# Patient Record
Sex: Male | Born: 1940 | Race: White | Hispanic: No | Smoking: Former smoker
Health system: Southern US, Community
[De-identification: ages and names within clinical notes are randomized; demographics above are authoritative.]

## PROBLEM LIST (undated history)

## (undated) DIAGNOSIS — I480 Paroxysmal atrial fibrillation: Secondary | ICD-10-CM

## (undated) DIAGNOSIS — I5189 Other ill-defined heart diseases: Secondary | ICD-10-CM

## (undated) DIAGNOSIS — I4891 Unspecified atrial fibrillation: Secondary | ICD-10-CM

## (undated) DIAGNOSIS — C801 Malignant (primary) neoplasm, unspecified: Secondary | ICD-10-CM

## (undated) DIAGNOSIS — I2510 Atherosclerotic heart disease of native coronary artery without angina pectoris: Secondary | ICD-10-CM

## (undated) DIAGNOSIS — N183 Chronic kidney disease, stage 3 unspecified: Secondary | ICD-10-CM

## (undated) DIAGNOSIS — K219 Gastro-esophageal reflux disease without esophagitis: Secondary | ICD-10-CM

## (undated) DIAGNOSIS — R911 Solitary pulmonary nodule: Secondary | ICD-10-CM

## (undated) DIAGNOSIS — I471 Supraventricular tachycardia, unspecified: Secondary | ICD-10-CM

## (undated) DIAGNOSIS — G4733 Obstructive sleep apnea (adult) (pediatric): Secondary | ICD-10-CM

## (undated) DIAGNOSIS — I10 Essential (primary) hypertension: Secondary | ICD-10-CM

## (undated) DIAGNOSIS — E785 Hyperlipidemia, unspecified: Secondary | ICD-10-CM

## (undated) DIAGNOSIS — N189 Chronic kidney disease, unspecified: Secondary | ICD-10-CM

## (undated) DIAGNOSIS — I639 Cerebral infarction, unspecified: Secondary | ICD-10-CM

## (undated) DIAGNOSIS — C679 Malignant neoplasm of bladder, unspecified: Secondary | ICD-10-CM

## (undated) HISTORY — PX: CHOLECYSTECTOMY: SHX55

## (undated) HISTORY — DX: Unspecified atrial fibrillation: I48.91

## (undated) HISTORY — PX: CORONARY ANGIOPLASTY: SHX604

## (undated) HISTORY — DX: Chronic kidney disease, unspecified: N18.9

## (undated) HISTORY — PX: CARDIAC SURGERY: SHX584

## (undated) HISTORY — PX: NASAL SEPTUM SURGERY: SHX37

## (undated) HISTORY — DX: Malignant neoplasm of bladder, unspecified: C67.9

## (undated) HISTORY — PX: EYE SURGERY: SHX253

---

## 2015-07-18 ENCOUNTER — Encounter (HOSPITAL_COMMUNITY): Payer: Self-pay | Admitting: Emergency Medicine

## 2015-07-18 ENCOUNTER — Emergency Department (HOSPITAL_COMMUNITY): Payer: Medicare Other

## 2015-07-18 ENCOUNTER — Emergency Department (HOSPITAL_COMMUNITY)
Admission: EM | Admit: 2015-07-18 | Discharge: 2015-07-18 | Disposition: A | Discharge status: Home / Self Care | Payer: Medicare Other | Attending: Emergency Medicine | Admitting: Emergency Medicine

## 2015-07-18 DIAGNOSIS — Z7982 Long term (current) use of aspirin: Secondary | ICD-10-CM | POA: Diagnosis not present

## 2015-07-18 DIAGNOSIS — J189 Pneumonia, unspecified organism: Secondary | ICD-10-CM | POA: Diagnosis not present

## 2015-07-18 DIAGNOSIS — I2510 Atherosclerotic heart disease of native coronary artery without angina pectoris: Secondary | ICD-10-CM | POA: Diagnosis not present

## 2015-07-18 DIAGNOSIS — Z79899 Other long term (current) drug therapy: Secondary | ICD-10-CM | POA: Insufficient documentation

## 2015-07-18 DIAGNOSIS — R079 Chest pain, unspecified: Secondary | ICD-10-CM | POA: Diagnosis present

## 2015-07-18 HISTORY — DX: Malignant (primary) neoplasm, unspecified: C80.1

## 2015-07-18 HISTORY — DX: Atherosclerotic heart disease of native coronary artery without angina pectoris: I25.10

## 2015-07-18 LAB — CBC WITH DIFFERENTIAL/PLATELET
BASOS ABS: 0 10*3/uL (ref 0.0–0.1)
Basophils Relative: 0 %
EOS ABS: 0 10*3/uL (ref 0.0–0.7)
EOS PCT: 0 %
HCT: 44.7 % (ref 39.0–52.0)
Hemoglobin: 14.7 g/dL (ref 13.0–17.0)
Lymphocytes Relative: 4 %
Lymphs Abs: 0.9 10*3/uL (ref 0.7–4.0)
MCH: 33.6 pg (ref 26.0–34.0)
MCHC: 32.9 g/dL (ref 30.0–36.0)
MCV: 102.1 fL — AB (ref 78.0–100.0)
MONO ABS: 1 10*3/uL (ref 0.1–1.0)
Monocytes Relative: 5 %
Neutro Abs: 18 10*3/uL — ABNORMAL HIGH (ref 1.7–7.7)
Neutrophils Relative %: 91 %
PLATELETS: 195 10*3/uL (ref 150–400)
RBC: 4.38 MIL/uL (ref 4.22–5.81)
RDW: 14.1 % (ref 11.5–15.5)
WBC: 19.9 10*3/uL — AB (ref 4.0–10.5)

## 2015-07-18 LAB — I-STAT CG4 LACTIC ACID, ED: LACTIC ACID, VENOUS: 1.56 mmol/L (ref 0.5–2.0)

## 2015-07-18 LAB — I-STAT TROPONIN, ED: TROPONIN I, POC: 0.02 ng/mL (ref 0.00–0.08)

## 2015-07-18 LAB — BASIC METABOLIC PANEL
ANION GAP: 8 (ref 5–15)
BUN: 25 mg/dL — AB (ref 6–20)
CALCIUM: 9.2 mg/dL (ref 8.9–10.3)
CO2: 28 mmol/L (ref 22–32)
Chloride: 104 mmol/L (ref 101–111)
Creatinine, Ser: 1.51 mg/dL — ABNORMAL HIGH (ref 0.61–1.24)
GFR calc Af Amer: 50 mL/min — ABNORMAL LOW (ref >60)
GFR, EST NON AFRICAN AMERICAN: 43 mL/min — AB (ref >60)
GLUCOSE: 145 mg/dL — AB (ref 65–99)
POTASSIUM: 4.8 mmol/L (ref 3.5–5.1)
SODIUM: 140 mmol/L (ref 135–145)

## 2015-07-18 MED ORDER — LEVOFLOXACIN 500 MG PO TABS: 500.0000 mg | ORAL_TABLET | Freq: Every day | ORAL | Status: DC

## 2015-07-18 MED ORDER — LEVOFLOXACIN 750 MG PO TABS
750.0000 mg | ORAL_TABLET | Freq: Once | ORAL | Status: AC
  Administered 2015-07-18: 750 mg via ORAL
  Filled 2015-07-18: qty 1

## 2015-07-18 NOTE — Discharge Instructions (Signed)

## 2015-07-18 NOTE — ED Notes (Signed)
PT woke up this AM at 0230 with a rapid heart rate. PT reports he was very uncomfortable. PT had a fluttering, pounding sensation in chest. PT had discomfort that radiated to neck and back. PT reports EMS came out and did an EKG. PT did not come to hospital at that time. EMS EKG shows HR at 96 bpm. PT arrived via POV this AM. No chest discomfort at triage. PT reports he feels "woozy"

## 2015-07-18 NOTE — ED Notes (Signed)
Pt is in stable condition upon d/c and ambulates from ED. 

## 2015-07-18 NOTE — ED Provider Notes (Signed)
CSN: [id number]     Arrival date & time 07/18/15  [id number] History   First MD Initiated Contact with Patient 07/18/15 [id number]     Chief Complaint  Patient presents with  . Chest Pain      HPI  She presents for evaluation after an episode of tachycardia shakes and chills. He is visiting from [place]. He had a normal day yesterday. 230s morning he awakened "freezing cold shaking. States he was "clammy and felt his heart going fast. He did not have a thermometer. He did take some Tylenol. Feeling the palpitations persisted. Per medics were called. 12-lead EKG was obtained which was available to me here and shows a sinus rhythm. No ectopy. He feels like the symptoms slowly improved. He came" to get checked before I had home". He is visiting from [place].  He had no chest pain. He did not feel short of breath. He has had a frequent cough over the last few weeks. Had pneumonia earlier this year that was treated and resolved. History of coronary artery disease. He had a stent place approximately 8 years ago. He had immediate stent occlusion 48 hours later had a repeat cath and repeat stenting. His exam is including echocardiogram, a Myoview since that time according to the patient's report had been "normal".  No past medical history on file. No past surgical history on file. No family history on file. Social History  Substance Use Topics  . Smoking status: Not on file  . Smokeless tobacco: Not on file  . Alcohol Use: Not on file    Review of Systems  Constitutional: Positive for fever, chills and diaphoresis. Negative for appetite change and fatigue.  HENT: Negative for mouth sores, sore throat and trouble swallowing.   Eyes: Negative for visual disturbance.  Respiratory: Negative for cough, chest tightness, shortness of breath and wheezing.   Cardiovascular: Positive for palpitations. Negative for chest pain.  Gastrointestinal: Negative for nausea, vomiting, abdominal pain, diarrhea and  abdominal distention.  Endocrine: Negative for polydipsia, polyphagia and polyuria.  Genitourinary: Negative for dysuria, frequency and hematuria.  Musculoskeletal: Negative for gait problem.  Skin: Negative for color change, pallor and rash.  Neurological: Negative for dizziness, syncope, light-headedness and headaches.  Hematological: Does not bruise/bleed easily.  Psychiatric/Behavioral: Negative for behavioral problems and confusion.      Allergies  Phosphorus; Sulfur; and Amoxicillin  Home Medications   Prior to Admission medications   Medication Sig Start Date End Date Taking? Authorizing Provider  amLODipine (NORVASC) 5 MG tablet Take 5 mg by mouth daily. 04/26/15  Yes Historical Provider, MD  aspirin 81 MG tablet Take 81 mg by mouth daily.   Yes Historical Provider, MD  carvedilol (COREG) 3.125 MG tablet Take 3.125 mg by mouth 2 (two) times daily with a meal.   Yes Historical Provider, MD  levofloxacin (LEVAQUIN) 500 MG tablet Take 1 tablet (500 mg total) by mouth daily. 07/18/15   Tanna Furry, MD  omega-3 acid ethyl esters (LOVAZA) 1 g capsule Take 1 g by mouth daily. 06/26/15  Yes Historical Provider, MD  prednisoLONE acetate (PRED FORTE) 1 % ophthalmic suspension Place 1 drop into the left eye daily.   Yes Historical Provider, MD  RABEprazole (ACIPHEX) 20 MG tablet Take 20 mg by mouth daily. 04/21/15  Yes Historical Provider, MD  simvastatin (ZOCOR) 20 MG tablet Take 20 mg by mouth daily. 05/29/15  Yes Historical Provider, MD  valACYclovir (VALTREX) 500 MG tablet Take 500 mg by mouth daily. 06/12/15  Yes Historical Provider, MD  valsartan-hydrochlorothiazide (DIOVAN-HCT) 160-12.5 MG tablet Take 1 tablet by mouth daily. 04/26/15  Yes Historical Provider, MD   BP 107/56 mmHg  Pulse 69  Temp(Src) 99.8 F (37.7 C) (Oral)  Resp 14  SpO2 95% Physical Exam  Constitutional: He is oriented to person, place, and time. He appears well-developed and well-nourished. No distress.  HENT:   Head: Normocephalic.  Eyes: Conjunctivae are normal. Pupils are equal, round, and reactive to light. No scleral icterus.  Neck: Normal range of motion. Neck supple. No thyromegaly present.  Cardiovascular: Normal rate and regular rhythm.  Exam reveals no gallop and no friction rub.   No murmur heard. Pulmonary/Chest: Effort normal and breath sounds normal. No respiratory distress. He has no wheezes. He has no rales.  Bibasilar fine crackles. Clear with cough.  Abdominal: Soft. Bowel sounds are normal. He exhibits no distension. There is no tenderness. There is no rebound.  Musculoskeletal: Normal range of motion.  Neurological: He is alert and oriented to person, place, and time.  Skin: Skin is warm and dry. No rash noted.  Psychiatric: He has a normal mood and affect. His behavior is normal.    ED Course  Procedures (including critical care time) Labs Review Labs Reviewed  CBC WITH DIFFERENTIAL/PLATELET - Abnormal; Notable for the following:    WBC 19.9 (*)    MCV 102.1 (*)    Neutro Abs 18.0 (*)    All other components within normal limits  BASIC METABOLIC PANEL - Abnormal; Notable for the following:    Glucose, Bld 145 (*)    BUN 25 (*)    Creatinine, Ser 1.51 (*)    GFR calc non Af Amer 43 (*)    GFR calc Af Amer 50 (*)    All other components within normal limits  I-STAT TROPOININ, ED  I-STAT CG4 LACTIC ACID, ED    Imaging Review Dg Chest 2 View  07/18/2015  CLINICAL DATA:  75 year old male with tachycardia and cough. Initial encounter. EXAM: CHEST  2 VIEW COMPARISON:  None. FINDINGS: Low normal lung volumes. Normal cardiac size and mediastinal contours. Visualized tracheal air column is within normal limits. No pneumothorax or pulmonary edema. Mild streaky/interstitial opacity along both lung bases. No pleural effusion. No other pulmonary opacity. Cholecystectomy clips. No acute osseous abnormality identified. IMPRESSION: Mild streaky bibasilar opacity which most resembles  atelectasis. No other acute cardiopulmonary abnormality identified. Electronically Signed   By: Genevie Ann M.D.   On: 07/18/2015 08:51   I have personally reviewed and evaluated these images and lab results as part of my medical decision-making.   EKG Interpretation None      MDM   Final diagnoses:  CAP (community acquired pneumonia)    Patient remains asymptomatic here. Temperature initially 99.8. Had taken some Tylenol earlier in the day. His EKG obtained by paramedics during the episode while he was still symptomatic appears normal without tachycardia or arrhythmia. His chest x-ray shows some streaky atelectasis at lung bases bilaterally. On exam he has crackles at the left base. A somewhat clear with cough do not resolve. He has a frequent congested cough and produces some yellow sputum. This may be a subtle pneumonia. This may be a simple bronchitis. I think he likely had a febrile episode earlier in the night. It is reassuring to know that his rhythm was normal limits and sinus when paramedics obtained his EKG. He has a leukocytosis but no lactic acidosis. Is not hypoxemic. He is able to  her here without symptoms. Given by mouth Levaquin. Discharged home. Appropriate for discharge. He will contact his primary care for evaluation upon his return home later today to [place].    Tanna Furry, MD 07/18/15 1005

## 2015-07-18 NOTE — ED Notes (Signed)
Pt hooked back up to monitor. 

## 2021-01-31 ENCOUNTER — Ambulatory Visit (INDEPENDENT_AMBULATORY_CARE_PROVIDER_SITE_OTHER): Payer: Medicare Other | Admitting: Internal Medicine

## 2021-01-31 ENCOUNTER — Other Ambulatory Visit: Payer: Self-pay

## 2021-01-31 ENCOUNTER — Encounter: Payer: Self-pay | Admitting: Internal Medicine

## 2021-01-31 VITALS — BP 130/76 | HR 69 | Ht 68.0 in | Wt 230.0 lb

## 2021-01-31 DIAGNOSIS — I4891 Unspecified atrial fibrillation: Secondary | ICD-10-CM | POA: Diagnosis not present

## 2021-01-31 DIAGNOSIS — E785 Hyperlipidemia, unspecified: Secondary | ICD-10-CM | POA: Diagnosis not present

## 2021-01-31 DIAGNOSIS — I2510 Atherosclerotic heart disease of native coronary artery without angina pectoris: Secondary | ICD-10-CM | POA: Diagnosis not present

## 2021-01-31 DIAGNOSIS — I480 Paroxysmal atrial fibrillation: Secondary | ICD-10-CM

## 2021-01-31 DIAGNOSIS — I10 Essential (primary) hypertension: Secondary | ICD-10-CM | POA: Diagnosis not present

## 2021-01-31 DIAGNOSIS — K219 Gastro-esophageal reflux disease without esophagitis: Secondary | ICD-10-CM

## 2021-01-31 MED ORDER — RABEPRAZOLE SODIUM 20 MG PO TBEC: 20.0000 mg | DELAYED_RELEASE_TABLET | Freq: Every day | ORAL | 3 refills | Status: DC

## 2021-01-31 NOTE — Patient Instructions (Signed)
Medication Instructions:   Your physician recommends that you continue on your current medications as directed. Please refer to the Current Medication list given to you today.  *If you need a refill on your cardiac medications before your next appointment, please call your pharmacy*   Lab Work:  Today: CBC, CMET, Lipid panel  If you have labs (blood work) drawn today and your tests are completely normal, you will receive your results only by: [place] (if you have MyChart) OR A paper copy in the mail If you have any lab test that is abnormal or we need to change your treatment, we will call you to review the results.   Testing/Procedures:  None ordered   Follow-Up: At [facility], you and your health needs are our priority.  As part of our continuing mission to provide you with exceptional heart care, we have created designated Provider Care Teams.  These Care Teams include your primary Cardiologist (physician) and Advanced Practice Providers (APPs -  Physician Assistants and Nurse Practitioners) who all work together to provide you with the care you need, when you need it.  We recommend signing up for the patient portal called "MyChart".  Sign up information is provided on this After Visit Summary.  MyChart is used to connect with patients for Virtual Visits (Telemedicine).  Patients are able to view lab/test results, encounter notes, upcoming appointments, etc.  Non-urgent messages can be sent to your provider as well.   To learn more about what you can do with MyChart, go to [email or web address].    Your next appointment:   3 month(s)  The format for your next appointment:   In Person  Provider:   You may see Dr. Harrell Gave End or one of the following Advanced Practice Providers on your designated Care Team:   Murray Hodgkins, NP Christell Faith, PA-C Cadence Kathlen Mody, [place]

## 2021-01-31 NOTE — Progress Notes (Signed)
New Outpatient Visit Date: 01/31/2021  Referring Provider: No referring provider defined for this encounter.  Chief Complaint: Establish cardiology care  HPI:  Jason Gates is a 80 y.o. male who is being seen today as a self-referral to establish cardiology care in the area.  He recently moved from [place], [place], to be closer to his son.  His wife died earlier this year.  Jason Gates has a history of coronary artery disease status post remote PCI's to the LAD and diagonal branch that was complicated by what sounds like subacute stent thrombosis.  He also has recently diagnosed paroxysmal atrial fibrillation for which she was briefly on amiodarone but is now only on apixaban without antiarrhythmic therapy.  Other past medical history includes PSVT, hypertension, hyperlipidemia, chronic kidney disease, obstructive sleep apnea on CPAP, stroke, bladder cancer, and GERD.  Jason Gates reports that he initially sought cardiology evaluation when he turned 48, as his father had a history of for heart attacks.  He reports a calcium score that was "okay though subsequent stress test was abnormal and led to cardiac catheterization and PCI to the LAD and a diagonal branch.  He had recurrent chest pain a few days later and required emergent repeat catheterization due to what sounds like abrupt closure of the diagonal branch.  He recovered well from this with most recent catheterization by his prior cardiologist, Dr. Milus Glazier, documenting 70-80% stenosis in proximal segment of D1 (previously stented and reported to be occluded on catheterization from 07/2009) and otherwise mild disease involving the LAD and LCx.  Jason Gates reports that he was diagnosed with atrial fibrillation just a few months ago after receiving his COVID-19 shots.  He was asymptomatic.  He had always had an irregular heartbeat but was never told before about atrial fibrillation.  He was initially on amiodarone though subsequently stopped this after  consulting with his PCP and former cardiologist.  He remains on apixaban.  Overall, he feels fairly well though he notes having put on quite a bit of weight over the last year due to being sedentary and eating out a lot.  He notes occasional episodes of transient chest pain that shoots across the chest and only last for couple of seconds.  It is not exertional and is notably different than what he experienced at the time of his stent closure.  He has exertional dyspnea that has been present for at least a year.  He attributes this to deconditioning and weight gain.  He has occasional palpitations but no lightheadedness or syncope.  He has some swelling in the legs.  --------------------------------------------------------------------------------------------------  Cardiovascular History & Procedures: Cardiovascular Problems: Coronary artery disease status post remote PCI's to LAD and D1 Paroxysmal atrial fibrillation  Risk Factors: Known coronary artery disease, hypertension, hyperlipidemia, obesity, male gender, prior tobacco use, and age greater than 26  Cath/PCI: LHC (02/04/2013, [place], [place]): LMCA normal.  LAD with 20% proximal disease.  70-80% D1 disease.  D2 normal.  LCx with minimal luminal irregularities.  Normal dominant RCA.  LVEF 60-65%.  LVEDP 12 mmHg.  Patent stents. LHC (08/16/2009, [place], [place]): LMCA normal.  LAD with 30% proximal disease and patent proximal stent.  Distal LAD tapers to a small vessel.  D1 stent is occluded.  D2 normal.  LCx with 30% ostial stenosis with calcification.  Large, dominant RCA with mild luminal irregularities.  LVEF 60-65% with LVEDP 15 mmHg.  CV Surgery: None  EP Procedures and Devices: None  Non-Invasive Evaluation(s): Pharmacologic MPI (02/01/2020,  [place], [place]): Normal study without ischemia or scar.  LVEF 60-70%. TTE (07/15/2017, [place], [place]): Normal LV size with mild LVH.  LVEF 55-60% with grade 1 diastolic dysfunction.  Normal RV size and  function.  Trivial MR, TR, and AI.  Recent CV Pertinent Labs: Lab Results  Component Value Date   K 4.8 07/18/2015   BUN 25 (H) 07/18/2015   CREATININE 1.51 (H) 07/18/2015    --------------------------------------------------------------------------------------------------  Past Medical History:  Diagnosis Date   Atrial fibrillation ([place])    Bladder cancer ([place])    Chronic kidney disease (CKD)    Coronary artery disease     Past Surgical History:  Procedure Laterality Date   CARDIAC SURGERY     CHOLECYSTECTOMY     EYE SURGERY     NASAL SEPTUM SURGERY      Current Meds  Medication Sig   amLODipine (NORVASC) 5 MG tablet Take 5 mg by mouth daily.   apixaban (ELIQUIS) 2.5 MG TABS tablet Take 2.5 mg by mouth 2 (two) times daily.   aspirin 81 MG tablet Take 81 mg by mouth daily.   prednisoLONE acetate (PRED FORTE) 1 % ophthalmic suspension Place 1 drop into the left eye daily.   RABEprazole (ACIPHEX) 20 MG tablet Take 20 mg by mouth daily.   simvastatin (ZOCOR) 20 MG tablet Take 20 mg by mouth daily.   valACYclovir (VALTREX) 500 MG tablet Take 500 mg by mouth daily.   valsartan-hydrochlorothiazide (DIOVAN-HCT) 160-12.5 MG tablet Take 1 tablet by mouth daily.    Allergies: Elemental sulfur, Phosphorus, and Amoxicillin  Social History   Tobacco Use   Smoking status: Former    Packs/day: 1.00    Years: 15.00    Pack years: 15.00    Types: Cigarettes    Quit date: 1976    Years since quitting: 46.9   Tobacco comments:    Quit smoking in 1976  Vaping Use   Vaping Use: Never used  Substance Use Topics   Alcohol use: Not Currently    Comment: quit in 1976   Drug use: Never    Family History  Problem Relation Age of Onset   Obesity Mother    Diabetes Father    Heart attack Father    Heart disease Father     Review of Systems: A 12-system review of systems was performed and was negative except as noted in the  HPI.  --------------------------------------------------------------------------------------------------  Physical Exam: BP 130/76 (BP Location: Right Arm, Patient Position: Sitting, Cuff Size: Large)   Pulse 69   Ht 5\' 8"  (1.727 m)   Wt 230 lb (104.3 kg)   SpO2 96%   BMI 34.97 kg/m   General:  NAD. HEENT: No conjunctival pallor or scleral icterus. Facemask in place. Neck: Supple without lymphadenopathy, thyromegaly, JVD, or HJR. No carotid bruit. Lungs: Normal work of breathing. Clear to auscultation bilaterally without wheezes or crackles. Heart: Regular rate and rhythm with occasional extrasystoles.  No murmurs, rubs, or gallops. Non-displaced PMI. Abd: Bowel sounds present. Soft, NT/ND without hepatosplenomegaly Ext: No lower extremity edema. Radial, PT, and DP pulses are 2+ bilaterally Skin: Warm and dry without rash. Neuro: CNIII-XII intact. Strength and fine-touch sensation intact in upper and lower extremities bilaterally. Psych: Normal mood and affect.  EKG:  Normal sinus rhythm with PAC's.  Lab Results  Component Value Date   WBC 19.9 (H) 07/18/2015   HGB 14.7 07/18/2015   HCT 44.7 07/18/2015   MCV 102.1 (H) 07/18/2015   PLT 195 07/18/2015  Lab Results  Component Value Date   NA 140 07/18/2015   K 4.8 07/18/2015   CL 104 07/18/2015   CO2 28 07/18/2015   BUN 25 (H) 07/18/2015   CREATININE 1.51 (H) 07/18/2015   GLUCOSE 145 (H) 07/18/2015    No results found for: CHOL, HDL, LDLCALC, LDLDIRECT, TRIG, CHOLHDL   --------------------------------------------------------------------------------------------------  ASSESSMENT AND PLAN: Coronary artery disease: Jason Gates reports rare episodes of fleeting, nonexertional chest pain on typical of angina.  Myocardial perfusion stress test a year ago in [place] was normal.  We will continue his current medications for secondary prevention.  We could consider discontinuation of ASA in the setting of long-term apixaban  therapy, though given uncertain stent type in the LAD and D1, I think it would be best to continue aspirin and apixaban as long as bleeding issues do not develop.  Continue aggressive secondary prevention.  Paroxysmal atrial fibrillation: EKG today and at prior visit with his former cardiologist in [place] show NSR with PAC's.  We will continue apixaban 2.5 mg BID based on age and prior kidney function on outside labs from 08/2020 (creatinine ~2).  We will repeat a CBC and CMP today.  Defer rechallanging with antiarrhythmic and beta blocker at this time.  Hyperlipidemia: Check lipid panel and CMP today.  Continue simvastatin 20 mg daily for now; low threshold to escalate to high-intensity statin if LDL > 70.  Hypertension: Blood pressure upper normal.  Continue current regimen of amlodipine and valsartan-HCTZ.  GERD: Aciphex refilled today at the patient's request, as he has not yet established with a PCP in the area.  Follow-up: Return to clinic in 3 months.  Approximately 50 minutes were spent on this encounter, including extensive review of outside records provided by Jason Gates.  Nelva Bush, MD 01/31/2021 2:30 PM

## 2021-02-01 ENCOUNTER — Encounter: Payer: Self-pay | Admitting: Internal Medicine

## 2021-02-01 DIAGNOSIS — E785 Hyperlipidemia, unspecified: Secondary | ICD-10-CM | POA: Insufficient documentation

## 2021-02-01 DIAGNOSIS — I480 Paroxysmal atrial fibrillation: Secondary | ICD-10-CM | POA: Insufficient documentation

## 2021-02-01 DIAGNOSIS — I10 Essential (primary) hypertension: Secondary | ICD-10-CM | POA: Insufficient documentation

## 2021-02-01 DIAGNOSIS — I2510 Atherosclerotic heart disease of native coronary artery without angina pectoris: Secondary | ICD-10-CM | POA: Insufficient documentation

## 2021-02-01 DIAGNOSIS — K219 Gastro-esophageal reflux disease without esophagitis: Secondary | ICD-10-CM | POA: Insufficient documentation

## 2021-02-02 LAB — COMPREHENSIVE METABOLIC PANEL
ALT: 25 IU/L (ref 0–44)
AST: 35 IU/L (ref 0–40)
Albumin/Globulin Ratio: 1.5 (ref 1.2–2.2)
Albumin: 4.1 g/dL (ref 3.6–4.6)
Alkaline Phosphatase: 69 IU/L (ref 44–121)
BUN/Creatinine Ratio: 12 (ref 10–24)
BUN: 23 mg/dL (ref 8–27)
Bilirubin Total: 0.4 mg/dL (ref 0.0–1.2)
CO2: 26 mmol/L (ref 20–29)
Calcium: 9.3 mg/dL (ref 8.6–10.2)
Chloride: 108 mmol/L — ABNORMAL HIGH (ref 96–106)
Creatinine, Ser: 1.93 mg/dL — ABNORMAL HIGH (ref 0.76–1.27)
Globulin, Total: 2.7 g/dL (ref 1.5–4.5)
Glucose: 119 mg/dL — ABNORMAL HIGH (ref 70–99)
Potassium: 4.9 mmol/L (ref 3.5–5.2)
Sodium: 147 mmol/L — ABNORMAL HIGH (ref 134–144)
Total Protein: 6.8 g/dL (ref 6.0–8.5)
eGFR: 34 mL/min/{1.73_m2} — ABNORMAL LOW (ref >59)

## 2021-02-02 LAB — CBC
Hematocrit: 40.1 % (ref 37.5–51.0)
Hemoglobin: 14 g/dL (ref 13.0–17.7)
MCH: 34.8 pg — ABNORMAL HIGH (ref 26.6–33.0)
MCHC: 34.9 g/dL (ref 31.5–35.7)
MCV: 100 fL — ABNORMAL HIGH (ref 79–97)
Platelets: 256 10*3/uL (ref 150–450)
RBC: 4.02 x10E6/uL — ABNORMAL LOW (ref 4.14–5.80)
RDW: 13.5 % (ref 11.6–15.4)
WBC: 8.2 10*3/uL (ref 3.4–10.8)

## 2021-02-02 LAB — LIPID PANEL
Chol/HDL Ratio: 3.9 ratio (ref 0.0–5.0)
Cholesterol, Total: 137 mg/dL (ref 100–199)
HDL: 35 mg/dL — ABNORMAL LOW (ref >39)
LDL Chol Calc (NIH): 68 mg/dL (ref 0–99)
Triglycerides: 204 mg/dL — ABNORMAL HIGH (ref 0–149)
VLDL Cholesterol Cal: 34 mg/dL (ref 5–40)

## 2021-02-08 ENCOUNTER — Telehealth: Payer: Self-pay

## 2021-02-08 NOTE — Telephone Encounter (Signed)
This pt LM on the triage line stating that he has a hx of bladder cancer, is new to the area and would like to know the steps of establishing care with [place], can you please give him a call to assist with getting records and making his appointment.

## 2021-04-02 ENCOUNTER — Other Ambulatory Visit (HOSPITAL_COMMUNITY): Payer: Self-pay | Admitting: Nephrology

## 2021-04-02 DIAGNOSIS — R718 Other abnormality of red blood cells: Secondary | ICD-10-CM

## 2021-04-02 DIAGNOSIS — Z8551 Personal history of malignant neoplasm of bladder: Secondary | ICD-10-CM

## 2021-04-02 DIAGNOSIS — I129 Hypertensive chronic kidney disease with stage 1 through stage 4 chronic kidney disease, or unspecified chronic kidney disease: Secondary | ICD-10-CM

## 2021-04-02 DIAGNOSIS — N1832 Chronic kidney disease, stage 3b: Secondary | ICD-10-CM

## 2021-04-02 DIAGNOSIS — R809 Proteinuria, unspecified: Secondary | ICD-10-CM

## 2021-04-02 NOTE — Progress Notes (Signed)
04/08/21 2:58 PM   Jason Gates. Jan 27, 1941 [id number]  Referring provider:  No referring provider defined for this encounter. Chief Complaint  Patient presents with   Bladder Cancer     HPI: Jason Gates. is a 81 y.o.male who presents today for further evaluation of bladder cancer.   He has a personal history of CKD stage 3b, bladder cancer, CIS, and proteinuria.   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  He underwent a TURBT in 2014 and 2018 it was high- grade he had BCG x3 that he didn't tolerate. He is on annual cystoscopic evaluation.   He is currently followed by nephrology.   He reports that he is due to a cystoscopy in April.   He reports that he has a weak stream which is not particularly bothersome.   He has a history of smoking.   PMH: Past Medical History:  Diagnosis Date   Atrial fibrillation ([place])    Bladder cancer ([place])    Chronic kidney disease (CKD)    Coronary artery disease     Surgical History: Past Surgical History:  Procedure Laterality Date   CARDIAC SURGERY     CHOLECYSTECTOMY     EYE SURGERY     NASAL SEPTUM SURGERY      Home Medications:  Allergies as of 04/03/2021       Reactions   Elemental Sulfur Other (See Comments)   intolerance   Phosphorus Other (See Comments)   intolerance   Amoxicillin Rash        Medication List        Accurate as of April 03, 2021 11:59 PM. If you have any questions, ask your nurse or doctor.          amLODipine 5 MG tablet Commonly known as: NORVASC Take 5 mg by mouth daily.   apixaban 2.5 MG Tabs tablet Commonly known as: ELIQUIS Take 2.5 mg by mouth 2 (two) times daily.   aspirin 81 MG tablet Take 81 mg by mouth daily.   prednisoLONE acetate 1 % ophthalmic suspension Commonly known as: PRED FORTE Place 1 drop into the left eye daily.   RABEprazole 20 MG tablet Commonly known as: ACIPHEX Take 1 tablet (20 mg total) by mouth  daily.   simvastatin 20 MG tablet Commonly known as: ZOCOR Take by mouth. What changed: Another medication with the same name was removed. Continue taking this medication, and follow the directions you see here. Changed by: Jason Espy, MD   valACYclovir 500 MG tablet Commonly known as: VALTREX Take by mouth. What changed: Another medication with the same name was removed. Continue taking this medication, and follow the directions you see here. Changed by: Jason Espy, MD   valsartan-hydrochlorothiazide 160-12.5 MG tablet Commonly known as: DIOVAN-HCT Take 1 tablet by mouth daily.        Allergies:  Allergies  Allergen Reactions   Elemental Sulfur Other (See Comments)    intolerance   Phosphorus Other (See Comments)    intolerance   Amoxicillin Rash    Family History: Family History  Problem Relation Age of Onset   Obesity Mother    Diabetes Father    Heart attack Father    Heart disease Father     Social History:  reports that he quit smoking about 47 years ago. His smoking use included cigarettes. He has a 15.00 pack-year smoking history. He does not have any smokeless tobacco history on file. He reports  that he does not currently use alcohol. He reports that he does not use drugs.   Physical Exam: BP (!) 179/76    Pulse 89    Ht 5\' 8"  (1.727 m)    Wt 232 lb (105.2 kg)    BMI 35.28 kg/m   Constitutional:  Alert and oriented, No acute distress. HEENT: [place] AT, moist mucus membranes.  Trachea midline, no masses. Cardiovascular: No clubbing, cyanosis, or edema. Respiratory: Normal respiratory effort, no increased work of breathing. Skin: No rashes, bruises or suspicious lesions. Neurologic: Grossly intact, no focal deficits, moving all 4 extremities. Psychiatric: Normal mood and affect.  Laboratory Data:  Lab Results  Component Value Date   CREATININE 1.93 (H) 01/31/2021   Assessment & Plan:    History of bladder cancer -Records from [place],  Dr. Donzetta Matters personally reviewed today - S/p TURBT in 2014 and 2018.  -We discussed guidelines for continued surveillance based on AUA recommendations, given his history of recurrent and higher risk disease, would recommend continuation of annual surveillance cystoscopy - Schedule for cystoscopy in April  - Return for cystoscopy   Return for cystoscopy   Jason Gates as a scribe for Jason Espy, MD.,have documented all relevant documentation on the behalf of Jason Espy, MD,as directed by  Jason Espy, MD while in the presence of Jason Espy, MD.  I have reviewed the above documentation for accuracy and completeness, and I agree with the above.   Jason Espy, MD    [facility] [address], [place], [place] [address] [phone]

## 2021-04-03 ENCOUNTER — Other Ambulatory Visit: Payer: Self-pay

## 2021-04-03 ENCOUNTER — Ambulatory Visit (INDEPENDENT_AMBULATORY_CARE_PROVIDER_SITE_OTHER): Payer: Medicare Other | Admitting: Urology

## 2021-04-03 ENCOUNTER — Encounter: Payer: Self-pay | Admitting: Urology

## 2021-04-03 VITALS — BP 179/76 | HR 89 | Ht 68.0 in | Wt 232.0 lb

## 2021-04-03 DIAGNOSIS — Z8551 Personal history of malignant neoplasm of bladder: Secondary | ICD-10-CM | POA: Diagnosis not present

## 2021-04-03 NOTE — Patient Instructions (Signed)

## 2021-04-15 ENCOUNTER — Ambulatory Visit (HOSPITAL_COMMUNITY)
Admission: RE | Admit: 2021-04-15 | Discharge: 2021-04-15 | Disposition: A | Discharge status: Home / Self Care | Payer: Medicare Other | Source: Ambulatory Visit | Attending: Nephrology | Admitting: Nephrology

## 2021-04-15 ENCOUNTER — Other Ambulatory Visit: Payer: Self-pay

## 2021-04-15 DIAGNOSIS — I129 Hypertensive chronic kidney disease with stage 1 through stage 4 chronic kidney disease, or unspecified chronic kidney disease: Secondary | ICD-10-CM | POA: Diagnosis present

## 2021-04-15 DIAGNOSIS — N1832 Chronic kidney disease, stage 3b: Secondary | ICD-10-CM | POA: Diagnosis not present

## 2021-04-15 DIAGNOSIS — Z8551 Personal history of malignant neoplasm of bladder: Secondary | ICD-10-CM | POA: Diagnosis present

## 2021-04-15 DIAGNOSIS — R718 Other abnormality of red blood cells: Secondary | ICD-10-CM | POA: Insufficient documentation

## 2021-04-15 DIAGNOSIS — R809 Proteinuria, unspecified: Secondary | ICD-10-CM | POA: Insufficient documentation

## 2021-05-16 ENCOUNTER — Ambulatory Visit (INDEPENDENT_AMBULATORY_CARE_PROVIDER_SITE_OTHER): Payer: Medicare Other | Admitting: Internal Medicine

## 2021-05-16 ENCOUNTER — Other Ambulatory Visit: Payer: Self-pay

## 2021-05-16 ENCOUNTER — Encounter: Payer: Self-pay | Admitting: Internal Medicine

## 2021-05-16 VITALS — BP 142/80 | HR 64 | Ht 68.0 in | Wt 229.0 lb

## 2021-05-16 DIAGNOSIS — I480 Paroxysmal atrial fibrillation: Secondary | ICD-10-CM | POA: Diagnosis not present

## 2021-05-16 DIAGNOSIS — I10 Essential (primary) hypertension: Secondary | ICD-10-CM | POA: Diagnosis not present

## 2021-05-16 DIAGNOSIS — E785 Hyperlipidemia, unspecified: Secondary | ICD-10-CM

## 2021-05-16 DIAGNOSIS — I471 Supraventricular tachycardia: Secondary | ICD-10-CM | POA: Insufficient documentation

## 2021-05-16 DIAGNOSIS — R600 Localized edema: Secondary | ICD-10-CM

## 2021-05-16 DIAGNOSIS — I2510 Atherosclerotic heart disease of native coronary artery without angina pectoris: Secondary | ICD-10-CM | POA: Diagnosis not present

## 2021-05-16 MED ORDER — VALSARTAN 160 MG PO TABS: 160.0000 mg | ORAL_TABLET | Freq: Every day | ORAL | 1 refills | Status: DC

## 2021-05-16 MED ORDER — FUROSEMIDE 40 MG PO TABS: 40.0000 mg | ORAL_TABLET | Freq: Every day | ORAL | 1 refills | Status: DC

## 2021-05-16 NOTE — Patient Instructions (Signed)
Medication Instructions:  ? ?Your physician has recommended you make the following change in your medication:  ? ?STOP Valsartan-Hydrochlorothiazide  ? ?START Valsartan 160 mg daily  ? ?START Furosemide (Lasix) 40 mg daily  ? ?*If you need a refill on your cardiac medications before your next appointment, please call your pharmacy* ? ? ?Lab Work: ? ?Your physician recommends that you return for lab work in: [place] (BMET) ?  - This lab is not fasting ? ?Testing/Procedures: ? ?None ordered ? ? ?Follow-Up: ?At [facility] you and your health needs are our priority.  As part of our continuing mission to provide you with exceptional heart care, we have created designated Provider Care Teams.  These Care Teams include your primary Cardiologist (physician) and Advanced Practice Providers (APPs -  Physician Assistants and Nurse Practitioners) who all work together to provide you with the care you need, when you need it. ? ?We recommend signing up for the patient portal called "MyChart".  Sign up information is provided on this After Visit Summary.  MyChart is used to connect with patients for Virtual Visits (Telemedicine).  Patients are able to view lab/test results, encounter notes, upcoming appointments, etc.  Non-urgent messages can be sent to your provider as well.   ?To learn more about what you can do with MyChart, go to [email or web address].   ? ?Your next appointment:   ?6 week(s) ? ?The format for your next appointment:   ?In Person ? ?Provider:   ?You may see Dr. Harrell Gave End or one of the following Advanced Practice Providers on your designated Care Team:   ?Murray Hodgkins, NP ?Christell Faith, PA-C ?Cadence Kathlen Mody, PA-C ?

## 2021-05-16 NOTE — Progress Notes (Signed)
? ?Follow-up Outpatient Visit ?Date: 05/16/2021 ? ?Primary Care Provider: ?Pcp, No ?No address on file ? ?Chief Complaint: Follow-up CAD and atrial fibrillation ? ?HPI:  Jason Gates is a 81 y.o. male with history of coronary artery disease status post remote PCI's to the LAD and diagonal branch that were complicated by what sounds like subacute stent thrombosis, paroxysmal atrial fibrillation, PSVT, hypertension, hyperlipidemia, chronic kidney disease, obstructive sleep apnea on CPAP, stroke, bladder cancer, and GERD, who presents for follow-up of coronary artery disease and atrial fibrillation.  I met him in 01/2021 shortly after he moved to [place] to be closer to his son.  He reported occasional transient chest pain and stable exertional dyspnea.  Labs drawn at that visit were similar to prior readings with his former providers in [place].  We did not make any medication changes or pursue further testing. ? ?Today, Jason Gates reports he has been feeling fairly well.  He notes that he sometimes wakes up at night with a feeling of his heart racing.  He thinks it may be related to being startled during a dream.  It happens a couple of times per month and usually resolves within a few minutes with deep breathing.  There are no associated symptoms.  He notes occasional transient sharp pains in the chest that are associated with certain movements, usually lasting only a second or two.  He has not had any shortness of breath.  He notes some mild swelling in his legs, that is dependent.  He is thinking of purchasing compression stockings.  He does not check his blood pressure regularly.  He remains on apixaban without bleeding. ? ?-------------------------------------------------------------------------------------------------- ? ?Cardiovascular History & Procedures: ?Cardiovascular Problems: ?Coronary artery disease status post remote PCI's to LAD and D1 ?Paroxysmal atrial fibrillation ?  ?Risk Factors: ?Known coronary artery disease,  hypertension, hyperlipidemia, obesity, male gender, prior tobacco use, and age greater than 47 ?  ?Cath/PCI: ?LHC (02/04/2013, [place], [place]): LMCA normal.  LAD with 20% proximal disease.  70-80% D1 disease.  D2 normal.  LCx with minimal luminal irregularities.  Normal dominant RCA.  LVEF 60-65%.  LVEDP 12 mmHg.  Patent stents. ?LHC (08/16/2009, [place], [place]): LMCA normal.  LAD with 30% proximal disease and patent proximal stent.  Distal LAD tapers to a small vessel.  D1 stent is occluded.  D2 normal.  LCx with 30% ostial stenosis with calcification.  Large, dominant RCA with mild luminal irregularities.  LVEF 60-65% with LVEDP 15 mmHg. ?  ?CV Surgery: ?None ?  ?EP Procedures and Devices: ?None ?  ?Non-Invasive Evaluation(s): ?Pharmacologic MPI (02/01/2020, [place], [place]): Normal study without ischemia or scar.  LVEF 60-70%. ?TTE (07/15/2017, [place], [place]): Normal LV size with mild LVH.  LVEF 55-60% with grade 1 diastolic dysfunction.  Normal RV size and function.  Trivial MR, TR, and AI. ? ?Recent CV Pertinent Labs: ?Lab Results  ?Component Value Date  ? CHOL 137 01/31/2021  ? HDL 35 (L) 01/31/2021  ? [place] 68 01/31/2021  ? TRIG 204 (H) 01/31/2021  ? CHOLHDL 3.9 01/31/2021  ? K 4.9 01/31/2021  ? BUN 23 01/31/2021  ? CREATININE 1.93 (H) 01/31/2021  ? ? ?Past medical and surgical history were reviewed and updated in EPIC. ? ?Current Meds  ?Medication Sig  ? amLODipine (NORVASC) 5 MG tablet Take 5 mg by mouth daily.  ? apixaban (ELIQUIS) 2.5 MG TABS tablet Take 2.5 mg by mouth 2 (two) times daily.  ? aspirin 81 MG tablet Take 81 mg by mouth daily.  ?  furosemide (LASIX) 40 MG tablet Take 1 tablet (40 mg total) by mouth daily.  ? prednisoLONE acetate (PRED FORTE) 1 % ophthalmic suspension Place 1 drop into the left eye daily.  ? RABEprazole (ACIPHEX) 20 MG tablet Take 1 tablet (20 mg total) by mouth daily.  ? simvastatin (ZOCOR) 20 MG tablet Take 20 mg by mouth daily.  ? valACYclovir (VALTREX) 500 MG tablet Take 500 mg by  mouth.  ? valsartan (DIOVAN) 160 MG tablet Take 1 tablet (160 mg total) by mouth daily.  ? [DISCONTINUED] valsartan-hydrochlorothiazide (DIOVAN-HCT) 160-12.5 MG tablet Take 1 tablet by mouth daily.  ? ? ?Allergies: Elemental sulfur, Phosphorus, and Amoxicillin ? ?Social History  ? ?Tobacco Use  ? Smoking status: Former  ?  Packs/day: 1.00  ?  Years: 15.00  ?  Pack years: 15.00  ?  Types: Cigarettes  ?  Quit date: 51  ?  Years since quitting: 22.2  ? Tobacco comments:  ?  Quit smoking in 1976  ?Vaping Use  ? Vaping Use: Never used  ?Substance Use Topics  ? Alcohol use: Not Currently  ?  Comment: quit in 1976  ? Drug use: Never  ? ? ?Family History  ?Problem Relation Age of Onset  ? Obesity Mother   ? Diabetes Father   ? Heart attack Father   ? Heart disease Father   ? ? ?Review of Systems: ?A 12-system review of systems was performed and was negative except as noted in the HPI. ? ?-------------------------------------------------------------------------------------------------- ? ?Physical Exam: ?BP (!) 142/80 (BP Location: Left Arm, Patient Position: Sitting, Cuff Size: Large)   Pulse 64   Ht 5' 8"  (1.727 m)   Wt 229 lb (103.9 kg)   SpO2 95%   BMI 34.82 kg/m?  ? ?General:  NAD. ?Neck: JVP approximately 6 cm with positive HJR. ?Lungs: Clear to auscultation bilaterally without wheezes or crackles. ?Heart: Regular rate and rhythm with occasional extrasystoles. ?Abdomen: Soft, nontender, nondistended. ?Extremities: 1+ pretibial edema bilaterally. ? ?EKG: Normal sinus rhythm with PACs. ? ?Lab Results  ?Component Value Date  ? WBC 8.2 01/31/2021  ? HGB 14.0 01/31/2021  ? HCT 40.1 01/31/2021  ? MCV 100 (H) 01/31/2021  ? PLT 256 01/31/2021  ? ? ?Lab Results  ?Component Value Date  ? NA 147 (H) 01/31/2021  ? K 4.9 01/31/2021  ? CL 108 (H) 01/31/2021  ? CO2 26 01/31/2021  ? BUN 23 01/31/2021  ? CREATININE 1.93 (H) 01/31/2021  ? GLUCOSE 119 (H) 01/31/2021  ? ALT 25 01/31/2021  ? ? ?Lab Results  ?Component Value Date  ?  CHOL 137 01/31/2021  ? HDL 35 (L) 01/31/2021  ? [place] 68 01/31/2021  ? TRIG 204 (H) 01/31/2021  ? CHOLHDL 3.9 01/31/2021  ? ? ?-------------------------------------------------------------------------------------------------- ? ?ASSESSMENT AND PLAN: ?Coronary artery disease: ?Jason Gates does not report frank angina.  He has infrequent episodes of transient, sharp chest pain that is likely noncardiac in nature.  We will continue current medications for secondary prevention including aspirin and simvastatin.  Though he remains on apixaban, I remain reluctant to discontinue aspirin unless bleeding complications ensue in the setting of unknown stent types and report of prior stent thrombosis. ? ?Paroxysmal atrial fibrillation and paroxysmal supraventricular tachycardia: ?Brief episodes of palpitations reported, particularly at night.  These could be short salvos of PAF or PSVT.  Given that they are self-limited and relatively infrequent without associated symptoms, we will defer medication changes at this time.  Given borderline low resting heart rate today,  I favor avoiding addition of a beta-blocker at this time.  Continue apixaban 2.5 mg twice daily, dosed based on age and creatinine greater than 1.5. ? ?Lower extremity edema: ?1+ pretibial edema noted on exam, which could be related to a number of factors including venous insufficiency, chronic kidney disease, and an element of diastolic heart failure.  Given his moderately impaired renal function, I am not sure that HCTZ is offering much benefit for blood pressure control and diuresis.  We have agreed to transition valsartan-HCTZ to valsartan 160 mg daily without HCTZ and instead add furosemide 40 mg daily.  We will repeat a BMP in 2 weeks. ? ?Hypertension: ?Blood pressure mildly elevated today.  As noted above, we will transition from valsartan-HCTZ to valsartan and furosemide.  Continue current dose of amlodipine. ? ?Hyperlipidemia: ?LDL at goal on last check in  01/2021.  Triglycerides were mildly elevated.  We will continue current dose of simvastatin. ? ?Follow-up: BMP in 2 weeks.  Return to clinic in 6 weeks. ? ?Nelva Bush, MD ?05/16/2021 ?4:23 PM ? ?

## 2021-05-17 ENCOUNTER — Telehealth: Payer: Self-pay | Admitting: Internal Medicine

## 2021-05-17 NOTE — Telephone Encounter (Signed)
Spoke with pt. Pt states "I want to talk about coming off this medication [furosemide] because I'm 81 and I'm not sure that I need this." After asking pt questions re his concern, pt reports he took first dose of furosemide 40 mg last night at bedtime. Pt up "all night going to the restroom." Pt also reports he felt dizzy upon getting up last night.  ? ?Pt did not check BP while feeling dizzy.  ?BP this morning 140/72. Pt denies any symptoms at time of call.  ? ?Pt in office yesterday to see Dr. Saunders Revel. Per note: ?Lower extremity edema: ?1+ pretibial edema noted on exam.. ?We have agreed to transition valsartan-HCTZ to valsartan 160 mg daily without HCTZ and instead add furosemide 40 mg daily.  We will repeat a BMP in 2 weeks ? ?Pt scheduled for repeat BMET 06/03/21 in our office. ? ?Advised pt this med change was d/t LE edema and advised that he only take furosemide in the morning to prevent going to the bathroom all night. Advised pt that the incr urine output was a good sign that the pill is working re the plan to reduce swelling. Advised pt to try taking in morning, monitor symptoms and blood pressure, let us know if have continued dizziness. Advised to check BP if symptomatic as well. If BP low and/or dizziness, pt will stop medication and let us know on Monday to follow up.  ? ?Pt agreeable to try this plan noted above, and has no further questions.  ?

## 2021-05-17 NOTE — Telephone Encounter (Signed)
Pt c/o medication issue: ? ?1. Name of Medication: Lasix  ? ?2. How are you currently taking this medication (dosage and times per day)? 1 tablet new started ;ast night ? ?3. Are you having a reaction (difficulty breathing--STAT)? Dizzy, up all night using restroom ? ?4. What is your medication issue? New medication ?

## 2021-05-17 NOTE — Telephone Encounter (Signed)
Attempted to call pt back. No answer. Lmtcb.  

## 2021-06-03 ENCOUNTER — Telehealth: Payer: Self-pay | Admitting: Internal Medicine

## 2021-06-03 ENCOUNTER — Other Ambulatory Visit: Payer: Medicare Other

## 2021-06-03 NOTE — Progress Notes (Signed)
? ?  06/04/21 ?CC:  ?Chief Complaint  ?Patient presents with  ? Cysto  ? ? ? ?HPI: ?Jason Gates. is a 81 y.o. male with a personal history of CKD stage 3b, bladder cancer, CIS, and proteinuria, who presents today for  ? 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  He underwent a TURBT in 2014 and 2018 it was high- grade he had BCG x3 that he didn't tolerate.  ? ?He is currently followed by nephrology.  ? ?He has a history of smoking.  ? ?Vitals:  ? 06/04/21 1335  ?BP: (!) 148/81  ?Pulse: 98  ? ?NED. A&Ox3.   ?No respiratory distress   ?Abd soft, NT, ND ?Normal phallus with bilateral descended testicles ? ?Cystoscopy Procedure Note ? ?Patient identification was confirmed, informed consent was obtained, and patient was prepped using Betadine solution.  Lidocaine jelly was administered per urethral meatus.   ? ? ?Pre-Procedure: ?- Inspection reveals a normal caliber ureteral meatus. ? ?Procedure: ?The flexible cystoscope was introduced without difficulty ?- No urethral strictures/lesions are present. ?- mildly enlarged prostate  ?- Normal bladder neck ?- Bilateral ureteral orifices identified ?- Bladder mucosa  multiple TUR stellate scars but no tumors  ?- No bladder stones ?- Mild trabeculation ? ?Retroflexion shows NED today  ? ? ?Post-Procedure: ?- Patient tolerated the procedure well ? ? ?Assessment/ Plan: ?History of bladder cancer  ?- -S/p TURBT in 2014 and 2018.  ?- Cystoscopy showed NED  ?- Will monitor with surveillance cystoscopy in 1 year.  ? ?Conley Rolls as a scribe for Hollice Espy, MD.,have documented all relevant documentation on the behalf of Hollice Espy, MD,as directed by  Hollice Espy, MD while in the presence of Hollice Espy, MD. ? ?I have reviewed the above documentation for accuracy and completeness, and I agree with the above.  ? ?Hollice Espy, MD ? ?

## 2021-06-04 ENCOUNTER — Ambulatory Visit (INDEPENDENT_AMBULATORY_CARE_PROVIDER_SITE_OTHER): Payer: Medicare Other | Admitting: Urology

## 2021-06-04 VITALS — BP 148/81 | HR 98 | Ht 68.0 in | Wt 229.0 lb

## 2021-06-04 DIAGNOSIS — Z8551 Personal history of malignant neoplasm of bladder: Secondary | ICD-10-CM | POA: Diagnosis not present

## 2021-06-04 LAB — URINALYSIS, COMPLETE
Bilirubin, UA: NEGATIVE
Glucose, UA: NEGATIVE
Leukocytes,UA: NEGATIVE
Nitrite, UA: NEGATIVE
Protein,UA: NEGATIVE
RBC, UA: NEGATIVE
Specific Gravity, UA: 1.025 (ref 1.005–1.030)
Urobilinogen, Ur: 0.2 mg/dL (ref 0.2–1.0)
pH, UA: 5.5 (ref 5.0–7.5)

## 2021-06-04 LAB — MICROSCOPIC EXAMINATION
Bacteria, UA: NONE SEEN
RBC: NONE SEEN /hpf (ref 0–2)

## 2021-06-10 ENCOUNTER — Other Ambulatory Visit (INDEPENDENT_AMBULATORY_CARE_PROVIDER_SITE_OTHER): Payer: Medicare Other

## 2021-06-10 DIAGNOSIS — I480 Paroxysmal atrial fibrillation: Secondary | ICD-10-CM

## 2021-06-10 DIAGNOSIS — I2510 Atherosclerotic heart disease of native coronary artery without angina pectoris: Secondary | ICD-10-CM

## 2021-06-10 DIAGNOSIS — I10 Essential (primary) hypertension: Secondary | ICD-10-CM

## 2021-06-11 LAB — BASIC METABOLIC PANEL
BUN/Creatinine Ratio: 11 (ref 10–24)
BUN: 19 mg/dL (ref 8–27)
CO2: 26 mmol/L (ref 20–29)
Calcium: 9.4 mg/dL (ref 8.6–10.2)
Chloride: 104 mmol/L (ref 96–106)
Creatinine, Ser: 1.67 mg/dL — ABNORMAL HIGH (ref 0.76–1.27)
Glucose: 121 mg/dL — ABNORMAL HIGH (ref 70–99)
Potassium: 4.2 mmol/L (ref 3.5–5.2)
Sodium: 145 mmol/L — ABNORMAL HIGH (ref 134–144)
eGFR: 41 mL/min/{1.73_m2} — ABNORMAL LOW (ref >59)

## 2021-06-12 ENCOUNTER — Other Ambulatory Visit: Payer: Self-pay | Admitting: Internal Medicine

## 2021-06-24 ENCOUNTER — Telehealth: Payer: Self-pay | Admitting: Internal Medicine

## 2021-06-24 NOTE — Telephone Encounter (Signed)
Pt c/o medication issue: ? ?1. Name of Medication:  ? ?2. How are you currently taking this medication (dosage and times per day)?  ? ?3. Are you having a reaction (difficulty breathing--STAT)?  ? ?4. What is your medication issue?  ? ?Patient states he purchased a new supplement and instructions advise to consult with his doctor. He was unable to provide the name of the supplement, but would like a call back from Dr. Darnelle Bos nurse to discuss further.  ? ?

## 2021-06-24 NOTE — Telephone Encounter (Signed)
Spoke with patient and he didn't realize until now that he has appointment this week with provider. He will just bring in the supplement with him to that appointment to review with him at that time. He was appreciative for the call back with no further questions at this time.  ?

## 2021-06-27 ENCOUNTER — Encounter: Payer: Self-pay | Admitting: Internal Medicine

## 2021-06-27 ENCOUNTER — Ambulatory Visit (INDEPENDENT_AMBULATORY_CARE_PROVIDER_SITE_OTHER): Payer: Medicare Other | Admitting: Internal Medicine

## 2021-06-27 VITALS — BP 160/70 | HR 73 | Ht 68.0 in | Wt 232.0 lb

## 2021-06-27 DIAGNOSIS — E785 Hyperlipidemia, unspecified: Secondary | ICD-10-CM

## 2021-06-27 DIAGNOSIS — I2510 Atherosclerotic heart disease of native coronary artery without angina pectoris: Secondary | ICD-10-CM

## 2021-06-27 DIAGNOSIS — I480 Paroxysmal atrial fibrillation: Secondary | ICD-10-CM | POA: Diagnosis not present

## 2021-06-27 DIAGNOSIS — I10 Essential (primary) hypertension: Secondary | ICD-10-CM | POA: Diagnosis not present

## 2021-06-27 MED ORDER — VALSARTAN-HYDROCHLOROTHIAZIDE 160-25 MG PO TABS: 1.0000 | ORAL_TABLET | Freq: Every day | ORAL | 0 refills | Status: DC

## 2021-06-27 NOTE — Progress Notes (Signed)
? ?Follow-up Outpatient Visit ?Date: 06/27/2021 ? ?Primary Care Provider: ?Pcp, No ?No address on file ? ?Chief Complaint: Follow-up coronary artery disease, paroxysmal atrial fibrillation, and edema ? ?HPI:  Jason Gates is a 81 y.o. male with history of coronary artery disease status post remote PCI's to the LAD and diagonal branch that were complicated by what sounds like subacute stent thrombosis, paroxysmal atrial fibrillation, PSVT, hypertension, hyperlipidemia, chronic kidney disease, obstructive sleep apnea on CPAP, stroke, bladder cancer, and GERD, who presents for follow-up of coronary artery disease and atrial fibrillation.  I met him in late March, at which time he was feeling fairly well though he noted occasional nocturnal palpitations.  He also reported transient sharp pain in the chest with certain movements.  Leg edema was also noted.  We agreed to discontinue HCTZ and start furosemide 40 mg daily.  He began taking the medication that night and reached out to [place] the next day because of nocturia and some dizziness.  He has not taken it since and reports that he feels back to baseline.  He reverted back to his prior prescription of valsartan-HCTZ 160/12.5 mg. ? ?Today, Jason Gates feels about the same as at our last visit.  He denies chest pain and shortness of breath.  He has occasional palpitations lasting only a few seconds without associated symptoms.  He remains on apixaban and aspirin without bleeding.  He has stable swelling in both legs and asks if there is an alternative to furosemide to help with this. ? ?-------------------------------------------------------------------------------------------------- ? ?Cardiovascular History & Procedures: ?Cardiovascular Problems: ?Coronary artery disease status post remote PCI's to LAD and D1 ?Paroxysmal atrial fibrillation ?  ?Risk Factors: ?Known coronary artery disease, hypertension, hyperlipidemia, obesity, male gender, prior tobacco use, and age greater  than 50 ?  ?Cath/PCI: ?LHC (02/04/2013, [place], [place]): LMCA normal.  LAD with 20% proximal disease.  70-80% D1 disease.  D2 normal.  LCx with minimal luminal irregularities.  Normal dominant RCA.  LVEF 60-65%.  LVEDP 12 mmHg.  Patent stents. ?LHC (08/16/2009, [place], [place]): LMCA normal.  LAD with 30% proximal disease and patent proximal stent.  Distal LAD tapers to a small vessel.  D1 stent is occluded.  D2 normal.  LCx with 30% ostial stenosis with calcification.  Large, dominant RCA with mild luminal irregularities.  LVEF 60-65% with LVEDP 15 mmHg. ?  ?CV Surgery: ?None ?  ?EP Procedures and Devices: ?None ?  ?Non-Invasive Evaluation(s): ?Pharmacologic MPI (02/01/2020, [place], [place]): Normal study without ischemia or scar.  LVEF 60-70%. ?TTE (07/15/2017, [place], [place]): Normal LV size with mild LVH.  LVEF 55-60% with grade 1 diastolic dysfunction.  Normal RV size and function.  Trivial MR, TR, and AI. ? ?Recent CV Pertinent Labs: ?Lab Results  ?Component Value Date  ? CHOL 137 01/31/2021  ? HDL 35 (L) 01/31/2021  ? [place] 68 01/31/2021  ? TRIG 204 (H) 01/31/2021  ? CHOLHDL 3.9 01/31/2021  ? K 4.2 06/10/2021  ? BUN 19 06/10/2021  ? CREATININE 1.67 (H) 06/10/2021  ? ? ?Past medical and surgical history were reviewed and updated in EPIC. ? ?Current Meds  ?Medication Sig  ? amLODipine (NORVASC) 5 MG tablet Take 5 mg by mouth daily.  ? apixaban (ELIQUIS) 2.5 MG TABS tablet Take 2.5 mg by mouth 2 (two) times daily.  ? aspirin 81 MG tablet Take 81 mg by mouth daily.  ? prednisoLONE acetate (PRED FORTE) 1 % ophthalmic suspension Place 1 drop into the left eye daily.  ? RABEprazole (ACIPHEX)  20 MG tablet Take 1 tablet (20 mg total) by mouth daily.  ? simvastatin (ZOCOR) 20 MG tablet Take 20 mg by mouth daily.  ? valACYclovir (VALTREX) 500 MG tablet Take 500 mg by mouth daily.  ? valsartan (DIOVAN) 160 MG tablet Take 1 tablet (160 mg total) by mouth daily.  ? valsartan-hydrochlorothiazide (DIOVAN-HCT) 160-12.5 MG tablet Take 1  tablet by mouth daily.  ? ? ?Allergies: Elemental sulfur, Phosphorus, and Amoxicillin ? ?Social History  ? ?Tobacco Use  ? Smoking status: Former  ?  Packs/day: 1.00  ?  Years: 15.00  ?  Pack years: 15.00  ?  Types: Cigarettes  ?  Quit date: 78  ?  Years since quitting: 47.3  ? Tobacco comments:  ?  Quit smoking in 1976  ?Vaping Use  ? Vaping Use: Never used  ?Substance Use Topics  ? Alcohol use: Not Currently  ?  Comment: quit in 1976  ? Drug use: Never  ? ? ?Family History  ?Problem Relation Age of Onset  ? Obesity Mother   ? Diabetes Father   ? Heart attack Father   ? Heart disease Father   ? ? ?Review of Systems: ?A 12-system review of systems was performed and was negative except as noted in the HPI. ? ?-------------------------------------------------------------------------------------------------- ? ?Physical Exam: ?BP (!) 160/70 (BP Location: Left Arm, Patient Position: Sitting, Cuff Size: Large)   Pulse 73   Ht _0  (1.727 m)   Wt 232 lb (105.2 kg)   SpO2 94%   BMI 35.28 kg/m?  ? ?General:  NAD. ?Neck: No JVD or HJR. ?Lungs: Clear to auscultation bilaterally without wheezes or crackles. ?Heart: Regular rate and rhythm without murmurs, rubs, or gallops. ?Abdomen: Soft, nontender, nondistended. ?Extremities: 1+ pitting edema in both calves. ? ?EKG: Normal sinus rhythm with PACs and PVCs.  Otherwise, no significant abnormality.  Compared to prior tracing from 05/16/2021, PVCs are now present. ? ?Lab Results  ?Component Value Date  ? WBC 8.2 01/31/2021  ? HGB 14.0 01/31/2021  ? HCT 40.1 01/31/2021  ? MCV 100 (H) 01/31/2021  ? PLT 256 01/31/2021  ? ? ?Lab Results  ?Component Value Date  ? NA 145 (H) 06/10/2021  ? K 4.2 06/10/2021  ? CL 104 06/10/2021  ? CO2 26 06/10/2021  ? BUN 19 06/10/2021  ? CREATININE 1.67 (H) 06/10/2021  ? GLUCOSE 121 (H) 06/10/2021  ? ALT 25 01/31/2021  ? ? ?Lab Results  ?Component Value Date  ? CHOL 137 01/31/2021  ? HDL 35 (L) 01/31/2021  ? [place] 68 01/31/2021  ? TRIG 204 (H)  01/31/2021  ? CHOLHDL 3.9 01/31/2021  ? ? ?-------------------------------------------------------------------------------------------------- ? ?ASSESSMENT AND PLAN: ?Coronary artery disease: ?No angina reported.  Continue current medications for secondary prevention. ? ?Paroxysmal atrial fibrillation: ?Jason Gates notes sporadic palpitations lasting only few seconds, unlikely to represent atrial fibrillation.  EKG today again shows PACs as well as some PVCs.  We will continue apixaban 2.5 mg twice daily based on age and creatinine.  Defer addition of AV nodal blocking agent at this time. ? ?Hyperlipidemia: ?LDL well controlled on last check in December.  Triglycerides were mildly elevated.  We will continue simvastatin 20 mg daily, though I would have a low threshold for escalating to higher intensity statin therapy if lipids have not improved on next follow-up. ? ?Hypertension: ?Blood pressure suboptimally controlled today.  Given persistent leg edema and intolerance to furosemide (though patient only took 1 dose), we have agreed to escalate valsartan-HCTZ  to 160/25 mg once daily.  We will continue with amlodipine 5 mg daily.  If blood pressure control improved but swelling persists, de-escalation/discontinuation of amlodipine may be helpful.  We will follow-up a BMP in 1 month. ? ?Follow-up: Return to clinic in 1 month with BMP before visit. ? ?Nelva Bush, MD ?06/27/2021 ?4:30 PM ? ?

## 2021-06-27 NOTE — Patient Instructions (Signed)
Medication Instructions:  ? ?Your physician has recommended you make the following change in your medication:  ? ?INCREASE Valsartan-Hydrochlorothiazide 160 - 25 mg daily - A new Rx has been sent to [facility] ? ?*If you need a refill on your cardiac medications before your next appointment, please call your pharmacy* ? ? ?Lab Work: ? ?-  Approximately 1 hour prior to your 1 month follow up appt, please arrive to the medical mall at [facility] to have STAT labs drawn, and the results will be discussed at your visit that same day. (BMET) ? ?- This lab is NOT fasting ? ?-  Please go to the [facility].  ?-  You will check in at the front desk to the right as you walk into the atrium.  ?-  Valet Parking is offered if needed. ?-  No appointment needed. You may go any day between 7 am and 6 pm. ? ? ?Testing/Procedures: ? ?None ordered ? ? ?Follow-Up: ?At [facility], you and your health needs are our priority.  As part of our continuing mission to provide you with exceptional heart care, we have created designated Provider Care Teams.  These Care Teams include your primary Cardiologist (physician) and Advanced Practice Providers (APPs -  Physician Assistants and Nurse Practitioners) who all work together to provide you with the care you need, when you need it. ? ?We recommend signing up for the patient portal called "MyChart".  Sign up information is provided on this After Visit Summary.  MyChart is used to connect with patients for Virtual Visits (Telemedicine).  Patients are able to view lab/test results, encounter notes, upcoming appointments, etc.  Non-urgent messages can be sent to your provider as well.   ?To learn more about what you can do with MyChart, go to [email or web address].   ? ?Your next appointment:   ? ?1 month(s) w/ APP ? ?The format for your next appointment:   ?In Person ? ?Provider:   ?You will see one of the following Advanced Practice Providers on your designated Care Team:   ?Murray Hodgkins, NP ?Christell Faith, PA-C ?Cadence Kathlen Mody, PA-C ? ? ?Important Information About Sugar ? ? ? ? ? ? ?

## 2021-06-28 ENCOUNTER — Other Ambulatory Visit: Payer: Self-pay

## 2021-06-28 MED ORDER — VALSARTAN-HYDROCHLOROTHIAZIDE 160-25 MG PO TABS: 1.0000 | ORAL_TABLET | Freq: Every day | ORAL | 0 refills | Status: DC

## 2021-06-29 ENCOUNTER — Encounter: Payer: Self-pay | Admitting: Internal Medicine

## 2021-07-09 ENCOUNTER — Encounter (HOSPITAL_BASED_OUTPATIENT_CLINIC_OR_DEPARTMENT_OTHER): Payer: Self-pay

## 2021-07-09 ENCOUNTER — Emergency Department (HOSPITAL_BASED_OUTPATIENT_CLINIC_OR_DEPARTMENT_OTHER): Payer: Medicare Other | Admitting: Radiology

## 2021-07-09 ENCOUNTER — Inpatient Hospital Stay (HOSPITAL_BASED_OUTPATIENT_CLINIC_OR_DEPARTMENT_OTHER)
Admission: EM | Admit: 2021-07-09 | Discharge: 2021-07-12 | DRG: 871 | Disposition: A | Discharge status: Home Health | Payer: Medicare Other | Attending: Internal Medicine | Admitting: Internal Medicine

## 2021-07-09 ENCOUNTER — Other Ambulatory Visit: Payer: Self-pay

## 2021-07-09 DIAGNOSIS — Z87891 Personal history of nicotine dependence: Secondary | ICD-10-CM

## 2021-07-09 DIAGNOSIS — Z7982 Long term (current) use of aspirin: Secondary | ICD-10-CM

## 2021-07-09 DIAGNOSIS — R0902 Hypoxemia: Secondary | ICD-10-CM

## 2021-07-09 DIAGNOSIS — J9601 Acute respiratory failure with hypoxia: Secondary | ICD-10-CM | POA: Diagnosis present

## 2021-07-09 DIAGNOSIS — Z880 Allergy status to penicillin: Secondary | ICD-10-CM

## 2021-07-09 DIAGNOSIS — Z8551 Personal history of malignant neoplasm of bladder: Secondary | ICD-10-CM

## 2021-07-09 DIAGNOSIS — R509 Fever, unspecified: Principal | ICD-10-CM

## 2021-07-09 DIAGNOSIS — Z7901 Long term (current) use of anticoagulants: Secondary | ICD-10-CM

## 2021-07-09 DIAGNOSIS — Z888 Allergy status to other drugs, medicaments and biological substances status: Secondary | ICD-10-CM

## 2021-07-09 DIAGNOSIS — I480 Paroxysmal atrial fibrillation: Secondary | ICD-10-CM | POA: Diagnosis present

## 2021-07-09 DIAGNOSIS — Z20822 Contact with and (suspected) exposure to covid-19: Secondary | ICD-10-CM | POA: Diagnosis present

## 2021-07-09 DIAGNOSIS — K219 Gastro-esophageal reflux disease without esophagitis: Secondary | ICD-10-CM | POA: Diagnosis present

## 2021-07-09 DIAGNOSIS — J189 Pneumonia, unspecified organism: Secondary | ICD-10-CM | POA: Diagnosis present

## 2021-07-09 DIAGNOSIS — I10 Essential (primary) hypertension: Secondary | ICD-10-CM | POA: Diagnosis present

## 2021-07-09 DIAGNOSIS — Z79899 Other long term (current) drug therapy: Secondary | ICD-10-CM

## 2021-07-09 DIAGNOSIS — R6520 Severe sepsis without septic shock: Secondary | ICD-10-CM | POA: Diagnosis present

## 2021-07-09 DIAGNOSIS — D7589 Other specified diseases of blood and blood-forming organs: Secondary | ICD-10-CM | POA: Diagnosis present

## 2021-07-09 DIAGNOSIS — A419 Sepsis, unspecified organism: Principal | ICD-10-CM | POA: Diagnosis present

## 2021-07-09 DIAGNOSIS — Z955 Presence of coronary angioplasty implant and graft: Secondary | ICD-10-CM

## 2021-07-09 DIAGNOSIS — N179 Acute kidney failure, unspecified: Secondary | ICD-10-CM | POA: Diagnosis present

## 2021-07-09 DIAGNOSIS — Z882 Allergy status to sulfonamides status: Secondary | ICD-10-CM

## 2021-07-09 DIAGNOSIS — I2510 Atherosclerotic heart disease of native coronary artery without angina pectoris: Secondary | ICD-10-CM | POA: Diagnosis present

## 2021-07-09 DIAGNOSIS — N1832 Chronic kidney disease, stage 3b: Secondary | ICD-10-CM | POA: Diagnosis present

## 2021-07-09 DIAGNOSIS — I129 Hypertensive chronic kidney disease with stage 1 through stage 4 chronic kidney disease, or unspecified chronic kidney disease: Secondary | ICD-10-CM | POA: Diagnosis present

## 2021-07-09 DIAGNOSIS — K224 Dyskinesia of esophagus: Secondary | ICD-10-CM | POA: Diagnosis present

## 2021-07-09 DIAGNOSIS — Z8249 Family history of ischemic heart disease and other diseases of the circulatory system: Secondary | ICD-10-CM

## 2021-07-09 DIAGNOSIS — E785 Hyperlipidemia, unspecified: Secondary | ICD-10-CM | POA: Diagnosis present

## 2021-07-09 LAB — CBC WITH DIFFERENTIAL/PLATELET
Abs Immature Granulocytes: 0.11 10*3/uL — ABNORMAL HIGH (ref 0.00–0.07)
Basophils Absolute: 0.1 10*3/uL (ref 0.0–0.1)
Basophils Relative: 0 %
Eosinophils Absolute: 0 10*3/uL (ref 0.0–0.5)
Eosinophils Relative: 0 %
HCT: 41.9 % (ref 39.0–52.0)
Hemoglobin: 14.1 g/dL (ref 13.0–17.0)
Immature Granulocytes: 1 %
Lymphocytes Relative: 4 %
Lymphs Abs: 0.9 10*3/uL (ref 0.7–4.0)
MCH: 34.2 pg — ABNORMAL HIGH (ref 26.0–34.0)
MCHC: 33.7 g/dL (ref 30.0–36.0)
MCV: 101.7 fL — ABNORMAL HIGH (ref 80.0–100.0)
Monocytes Absolute: 1.1 10*3/uL — ABNORMAL HIGH (ref 0.1–1.0)
Monocytes Relative: 5 %
Neutro Abs: 20.3 10*3/uL — ABNORMAL HIGH (ref 1.7–7.7)
Neutrophils Relative %: 90 %
Platelets: 203 10*3/uL (ref 150–400)
RBC: 4.12 MIL/uL — ABNORMAL LOW (ref 4.22–5.81)
RDW: 14.1 % (ref 11.5–15.5)
WBC: 22.4 10*3/uL — ABNORMAL HIGH (ref 4.0–10.5)
nRBC: 0 % (ref 0.0–0.2)

## 2021-07-09 LAB — LIPASE, BLOOD: Lipase: 17 U/L (ref 11–51)

## 2021-07-09 LAB — BASIC METABOLIC PANEL
Anion gap: 14 (ref 5–15)
BUN: 26 mg/dL — ABNORMAL HIGH (ref 8–23)
CO2: 23 mmol/L (ref 22–32)
Calcium: 9.9 mg/dL (ref 8.9–10.3)
Chloride: 102 mmol/L (ref 98–111)
Creatinine, Ser: 1.69 mg/dL — ABNORMAL HIGH (ref 0.61–1.24)
GFR, Estimated: 40 mL/min — ABNORMAL LOW (ref >60)
Glucose, Bld: 104 mg/dL — ABNORMAL HIGH (ref 70–99)
Potassium: 4.7 mmol/L (ref 3.5–5.1)
Sodium: 139 mmol/L (ref 135–145)

## 2021-07-09 LAB — URINALYSIS, ROUTINE W REFLEX MICROSCOPIC
Bilirubin Urine: NEGATIVE
Glucose, UA: NEGATIVE mg/dL
Hgb urine dipstick: NEGATIVE
Ketones, ur: NEGATIVE mg/dL
Leukocytes,Ua: NEGATIVE
Nitrite: NEGATIVE
Protein, ur: NEGATIVE mg/dL
Specific Gravity, Urine: 1.009 (ref 1.005–1.030)
pH: 5.5 (ref 5.0–8.0)

## 2021-07-09 LAB — CBC
HCT: 49.8 % (ref 39.0–52.0)
Hemoglobin: 16.7 g/dL (ref 13.0–17.0)
MCH: 34.2 pg — ABNORMAL HIGH (ref 26.0–34.0)
MCHC: 33.5 g/dL (ref 30.0–36.0)
MCV: 102 fL — ABNORMAL HIGH (ref 80.0–100.0)
Platelets: 204 10*3/uL (ref 150–400)
RBC: 4.88 MIL/uL (ref 4.22–5.81)
RDW: 14 % (ref 11.5–15.5)
WBC: 18.5 10*3/uL — ABNORMAL HIGH (ref 4.0–10.5)
nRBC: 0 % (ref 0.0–0.2)

## 2021-07-09 LAB — HEPATIC FUNCTION PANEL
ALT: 19 U/L (ref 0–44)
AST: 27 U/L (ref 15–41)
Albumin: 4.3 g/dL (ref 3.5–5.0)
Alkaline Phosphatase: 47 U/L (ref 38–126)
Bilirubin, Direct: 0.2 mg/dL (ref 0.0–0.2)
Indirect Bilirubin: 0.6 mg/dL (ref 0.3–0.9)
Total Bilirubin: 0.8 mg/dL (ref 0.3–1.2)
Total Protein: 6.8 g/dL (ref 6.5–8.1)

## 2021-07-09 LAB — RESP PANEL BY RT-PCR (FLU A&B, COVID) ARPGX2
Influenza A by PCR: NEGATIVE
Influenza B by PCR: NEGATIVE
SARS Coronavirus 2 by RT PCR: NEGATIVE

## 2021-07-09 LAB — LACTIC ACID, PLASMA: Lactic Acid, Venous: 1.2 mmol/L (ref 0.5–1.9)

## 2021-07-09 LAB — BRAIN NATRIURETIC PEPTIDE: B Natriuretic Peptide: 62.2 pg/mL (ref 0.0–100.0)

## 2021-07-09 LAB — TROPONIN I (HIGH SENSITIVITY)
Troponin I (High Sensitivity): 12 ng/L (ref <18)
Troponin I (High Sensitivity): 17 ng/L (ref <18)

## 2021-07-09 MED ORDER — VANCOMYCIN HCL 750 MG/150ML IV SOLN
750.0000 mg | INTRAVENOUS | Status: DC
  Administered 2021-07-10: 750 mg via INTRAVENOUS
  Filled 2021-07-09 (×2): qty 150

## 2021-07-09 MED ORDER — VANCOMYCIN HCL IN DEXTROSE 1-5 GM/200ML-% IV SOLN
1000.0000 mg | Freq: Once | INTRAVENOUS | Status: AC
Start: 2021-07-09 — End: 2021-07-10
  Administered 2021-07-10: 1000 mg via INTRAVENOUS
  Filled 2021-07-09: qty 200

## 2021-07-09 MED ORDER — CEFEPIME HCL 2 G IV SOLR
2.0000 g | Freq: Once | INTRAVENOUS | Status: AC
  Administered 2021-07-09: 2 g via INTRAVENOUS
  Filled 2021-07-09: qty 12.5

## 2021-07-09 MED ORDER — VANCOMYCIN HCL IN DEXTROSE 1-5 GM/200ML-% IV SOLN
1000.0000 mg | Freq: Once | INTRAVENOUS | Status: AC
  Administered 2021-07-09: 1000 mg via INTRAVENOUS
  Filled 2021-07-09: qty 200

## 2021-07-09 MED ORDER — VALSARTAN-HYDROCHLOROTHIAZIDE 160-25 MG PO TABS: 1.0000 | ORAL_TABLET | Freq: Every day | ORAL | Status: DC

## 2021-07-09 MED ORDER — ACETAMINOPHEN 500 MG PO TABS
1000.0000 mg | ORAL_TABLET | Freq: Once | ORAL | Status: AC
Start: 2021-07-09 — End: 2021-07-09
  Administered 2021-07-09: 1000 mg via ORAL
  Filled 2021-07-09: qty 2

## 2021-07-09 MED ORDER — SIMVASTATIN 20 MG PO TABS
20.0000 mg | ORAL_TABLET | Freq: Every day | ORAL | Status: DC
  Administered 2021-07-10 – 2021-07-12 (×3): 20 mg via ORAL
  Filled 2021-07-09 (×3): qty 1

## 2021-07-09 MED ORDER — VALACYCLOVIR HCL 500 MG PO TABS
500.0000 mg | ORAL_TABLET | Freq: Every day | ORAL | Status: DC
  Administered 2021-07-10 – 2021-07-12 (×3): 500 mg via ORAL
  Filled 2021-07-09 (×3): qty 1

## 2021-07-09 MED ORDER — AMLODIPINE BESYLATE 5 MG PO TABS
5.0000 mg | ORAL_TABLET | Freq: Every day | ORAL | Status: DC
  Administered 2021-07-10 – 2021-07-12 (×3): 5 mg via ORAL
  Filled 2021-07-09 (×3): qty 1

## 2021-07-09 MED ORDER — VANCOMYCIN HCL 2000 MG/400ML IV SOLN: 2000.0000 mg | Freq: Once | INTRAVENOUS | Status: DC

## 2021-07-09 MED ORDER — APIXABAN 2.5 MG PO TABS
2.5000 mg | ORAL_TABLET | Freq: Two times a day (BID) | ORAL | Status: DC
  Administered 2021-07-10 – 2021-07-12 (×6): 2.5 mg via ORAL
  Filled 2021-07-09 (×6): qty 1

## 2021-07-09 NOTE — Progress Notes (Signed)
Pharmacy Antibiotic Note ? ?Jason Gates. is a 81 y.o. male admitted on 07/09/2021 with pneumonia.  Pharmacy has been consulted for Vancomycin dosing. ? ?Plan: ?Vancomycin 2000 mg x 1, followed by 750 mg IV q24h (eAUC 465.2, SCr 1.69, goal AUC 400-550) ?Cefepime 2 g IV x 1 per MD  ?Follow-up clinical status, renal function ?Follow-up cultures, LOT, de-escalate as able ?Obtain Vancomycin levels as appropriate ? ?Height: '5\' 8"'$  (172.7 cm) ?Weight: 102.5 kg (226 lb) ?IBW/kg (Calculated) : 68.4 ? ?Temp (24hrs), Avg:100.6 ?F (38.1 ?C), Min:98.3 ?F (36.8 ?C), Max:102.5 ?F (39.2 ?C) ? ?Recent Labs  ?Lab 07/09/21 ?1853 07/09/21 ?2042  ?WBC 18.5*  --   ?CREATININE 1.69*  --   ?LATICACIDVEN  --  1.2  ?  ?Estimated Creatinine Clearance: 39.1 mL/min (A) (by C-G formula based on SCr of 1.69 mg/dL (H)).   ? ?Allergies  ?Allergen Reactions  ? Elemental Sulfur Other (See Comments)  ?  intolerance  ? Phosphorus Other (See Comments)  ?  intolerance  ? Amoxicillin Rash  ? ? ?Antimicrobials this admission: ?Vancomycin 5/16 >> ?Cefepime 5/16  ? ?Microbiology results: ?5/16 BCx: pending ? ? ?Thank you for allowing pharmacy to be a part of this patient?s care. ? ?Vance Peper, PharmD ?PGY1 Pharmacy Resident ?07/09/2021 11:00 PM  ? ?Please check AMION for all [place] phone numbers ?After 10:00 PM, call [facility] [phone] ? ? ?

## 2021-07-09 NOTE — ED Triage Notes (Signed)
Pt reports SOB that started this afternoon. Pt also c/o pain that goes to his back. Pt with strong, moist cough during triage. Pt states that at home his oxygen was 86% on RA.  ?

## 2021-07-09 NOTE — Plan of Care (Signed)
[facility] will assume care on arrival to accepting facility. Until arrival, care as per EDP. However, [facility] available 24/7 for questions and assistance.  Nursing staff, please page [facility] Admits and Consults ([phone]) as soon as the patient arrives to the hospital.   

## 2021-07-09 NOTE — ED Notes (Signed)
Pt placed on 2L [place] due to oxygen saturation of 88%.

## 2021-07-09 NOTE — ED Provider Notes (Signed)
?[facility] EMERGENCY DEPT ?Provider Note ? ? ?CSN: [id number] ?Arrival date & time: 07/09/21  1830 ? ?  ? ?History ? ?Chief Complaint  ?Patient presents with  ? Shortness of Breath  ? ? ?Jason Betters Costas Sena. is a 81 y.o. male. ? ?HPI ? ?81 year old male with past medical history of CAD, atrial fibrillation anticoagulated on Eliquis, CKD presents to the emergency department with shortness of breath and wet cough.  Patient states he has been feeling generally unwell today.  Had 1 episode of nausea prior to arrival.  Complaining of worsening shortness of breath with hypoxia noted on his home monitor.  States has been compliant with his Eliquis, last dose was this morning.  Denies any swelling of his lower extremities.  No acute chest or abdominal pain.  No recent sick contacts. ? ?Home Medications ?Prior to Admission medications   ?Medication Sig Start Date End Date Taking? Authorizing Provider  ?amLODipine (NORVASC) 5 MG tablet Take 5 mg by mouth daily. 04/26/15   [provider]  ?apixaban (ELIQUIS) 2.5 MG TABS tablet Take 2.5 mg by mouth 2 (two) times daily.    [provider]  ?aspirin 81 MG tablet Take 81 mg by mouth daily.    [provider]  ?prednisoLONE acetate (PRED FORTE) 1 % ophthalmic suspension Place 1 drop into the left eye daily.    [provider]  ?RABEprazole (ACIPHEX) 20 MG tablet Take 1 tablet (20 mg total) by mouth daily. 01/31/21 01/26/22  End, Harrell Gave, MD  ?simvastatin (ZOCOR) 20 MG tablet Take 20 mg by mouth daily.    [provider]  ?valACYclovir (VALTREX) 500 MG tablet Take 500 mg by mouth daily. 06/12/15   [provider]  ?valsartan-hydrochlorothiazide (DIOVAN-HCT) 160-25 MG tablet Take 1 tablet by mouth daily. 06/28/21 09/26/21  Nelva Bush, MD  ?   ? ?Allergies    ?Elemental sulfur, Phosphorus, and Amoxicillin   ? ?Review of Systems   ?Review of Systems  ?Constitutional:  Positive for chills and fatigue. Negative for  fever.  ?Respiratory:  Positive for cough, chest tightness and shortness of breath.   ?Cardiovascular:  Negative for chest pain, palpitations and leg swelling.  ?Gastrointestinal:  Positive for nausea. Negative for abdominal pain, diarrhea and vomiting.  ?Musculoskeletal:  Negative for neck pain.  ?Skin:  Negative for rash.  ?Neurological:  Negative for headaches.  ? ?Physical Exam ?Updated Vital Signs ?BP (!) 147/63   Pulse 96   Temp (!) 101 ?F (38.3 ?C) (Oral)   Resp (!) 22   Ht '5\' 8"'$  (1.727 m)   Wt 102.5 kg   SpO2 91%   BMI 34.36 kg/m?  ?Physical Exam ?Vitals and nursing note reviewed.  ?Constitutional:   ?   General: He is not in acute distress. ?   Appearance: Normal appearance. He is not diaphoretic.  ?HENT:  ?   Head: Normocephalic.  ?   Mouth/Throat:  ?   Mouth: Mucous membranes are moist.  ?Cardiovascular:  ?   Rate and Rhythm: Tachycardia present.  ?Pulmonary:  ?   Effort: Pulmonary effort is normal. Tachypnea present.  ?   Breath sounds: Examination of the right-lower field reveals decreased breath sounds. Examination of the left-lower field reveals decreased breath sounds. Decreased breath sounds and rhonchi present.  ?   Comments: 2L  ?Abdominal:  ?   Palpations: Abdomen is soft.  ?   Tenderness: There is no abdominal tenderness.  ?Musculoskeletal:  ?   Right lower leg: No  edema.  ?   Left lower leg: No edema.  ?Skin: ?   General: Skin is warm.  ?Neurological:  ?   Mental Status: He is alert and oriented to person, place, and time. Mental status is at baseline.  ?Psychiatric:     ?   Mood and Affect: Mood normal.  ? ? ?ED Results / Procedures / Treatments   ?Labs ?(all labs ordered are listed, but only abnormal results are displayed) ?Labs Reviewed  ?BASIC METABOLIC PANEL - Abnormal; Notable for the following components:  ?    Result Value  ? Glucose, Bld 104 (*)   ? BUN 26 (*)   ? Creatinine, Ser 1.69 (*)   ? GFR, Estimated 40 (*)   ? All other components within normal limits  ?CBC - Abnormal;  Notable for the following components:  ? WBC 18.5 (*)   ? MCV 102.0 (*)   ? MCH 34.2 (*)   ? All other components within normal limits  ?URINALYSIS, ROUTINE W REFLEX MICROSCOPIC - Abnormal; Notable for the following components:  ? Color, Urine COLORLESS (*)   ? All other components within normal limits  ?RESP PANEL BY RT-PCR (FLU A&B, COVID) [id number]  ?CULTURE, BLOOD (ROUTINE X 2)  ?CULTURE, BLOOD (ROUTINE X 2)  ?BRAIN NATRIURETIC PEPTIDE  ?LACTIC ACID, PLASMA  ?HEPATIC FUNCTION PANEL  ?LIPASE, BLOOD  ?DIFFERENTIAL  ?TROPONIN I (HIGH SENSITIVITY)  ?TROPONIN I (HIGH SENSITIVITY)  ? ? ?EKG ?EKG Interpretation ? ?Date/Time:  Tuesday Jul 09 2021 18:39:06 EDT ?Ventricular Rate:  102 ?PR Interval:    ?QRS Duration: 78 ?QT Interval:  330 ?QTC Calculation: 430 ?R Axis:   -1 ?Text Interpretation: Atrial fibrillation with rapid ventricular response with premature ventricular or aberrantly conducted complexes Nonspecific ST abnormality Abnormal ECG When compared with ECG of 18-Jul-2015 07:48, PREVIOUS ECG IS PRESENT Confirmed by Lavenia Atlas [phone]) on 07/09/2021 7:57:13 PM ? ?Radiology ?DG Chest 2 View ? ?Result Date: 07/09/2021 ?CLINICAL DATA:  Acute shortness of breath and chest pain beginning this afternoon. Hypoxia. EXAM: CHEST - 2 VIEW COMPARISON:  07/18/2015 FINDINGS: Lordotic positioning noted. The heart size and mediastinal contours are within normal limits. Both lungs are clear. The visualized skeletal structures are unremarkable. IMPRESSION: No active cardiopulmonary disease. Electronically Signed   By: Marlaine Hind M.D.   On: 07/09/2021 19:42   ? ?Procedures ?Procedures  ? ? ?Medications Ordered in ED ?Medications  ?ceFEPIme (MAXIPIME) 2 g in sodium chloride 0.9 % 100 mL IVPB (has no administration in time range)  ?vancomycin (VANCOREADY) IVPB 750 mg/150 mL (has no administration in time range)  ?vancomycin (VANCOCIN) IVPB 1000 mg/200 mL premix (has no administration in time range)  ?  Followed by  ?vancomycin  (VANCOCIN) IVPB 1000 mg/200 mL premix (has no administration in time range)  ?acetaminophen (TYLENOL) tablet 1,000 mg (1,000 mg Oral Given 07/09/21 2040)  ? ? ?ED Course/ Medical Decision Making/ A&P ?  ?                        ?Medical Decision Making ?Amount and/or Complexity of Data Reviewed ?Labs: ordered. ?Radiology: ordered. ? ?Risk ?OTC drugs. ?Prescription drug management. ?Decision regarding hospitalization. ? ? ?This patient presents to the ED for concern of shortness of breath, hypoxia, chest congestion, this involves an extensive number of treatment options, and is a complaint that carries with it a high risk of complications and morbidity.  The differential diagnosis includes infection, sepsis, hypoxia, pneumonia, PE, heart  failure ? ? ?Additional history obtained: ?-Additional history obtained from son at bedside ?-External records from outside source obtained and reviewed including: Chart review including previous notes, labs, imaging, consultation notes ? ? ?Lab Tests: ?-I ordered, reviewed, and interpreted labs.  The pertinent results include: Leukocytosis of 18, baseline AKI, normal cardiac enzymes/BNP, flu and COVID-negative ? ? ?EKG ?-Atrial fibrillation which is baseline for the patient ? ? ?Imaging Studies ordered: ?-I ordered imaging studies including chest x-ray ?-I independently visualized and interpreted imaging which showed no acute finding ?-I agree with the radiologist interpretation ? ? ?Medicines ordered and prescription drug management: ?-I ordered medication including Tylenol for fever ?-Reevaluation of the patient after these medicines showed that the patient improved ?-I have reviewed the patients home medicines and have made adjustments as needed ? ? ?ED Course: ?81 year old male presents emergency department with shortness of breath, cough, hypoxia.  He is hypoxic on arrival to the high 80s, requiring 2 L nasal cannula.  No acute respiratory distress.  He has rattling/wet cough.   No swelling of his lower extremities.  EKG shows atrial fibrillation which is baseline, patient states he has been compliant with Eliquis, lower suspicion for PE secondary to this.  Patient has a leukocytosis, develo

## 2021-07-10 ENCOUNTER — Encounter (HOSPITAL_COMMUNITY): Payer: Self-pay | Admitting: Internal Medicine

## 2021-07-10 DIAGNOSIS — Z880 Allergy status to penicillin: Secondary | ICD-10-CM | POA: Diagnosis not present

## 2021-07-10 DIAGNOSIS — Z7901 Long term (current) use of anticoagulants: Secondary | ICD-10-CM | POA: Diagnosis not present

## 2021-07-10 DIAGNOSIS — D7589 Other specified diseases of blood and blood-forming organs: Secondary | ICD-10-CM | POA: Diagnosis present

## 2021-07-10 DIAGNOSIS — K219 Gastro-esophageal reflux disease without esophagitis: Secondary | ICD-10-CM | POA: Diagnosis present

## 2021-07-10 DIAGNOSIS — K224 Dyskinesia of esophagus: Secondary | ICD-10-CM | POA: Diagnosis present

## 2021-07-10 DIAGNOSIS — E785 Hyperlipidemia, unspecified: Secondary | ICD-10-CM | POA: Diagnosis present

## 2021-07-10 DIAGNOSIS — I2510 Atherosclerotic heart disease of native coronary artery without angina pectoris: Secondary | ICD-10-CM | POA: Diagnosis present

## 2021-07-10 DIAGNOSIS — J189 Pneumonia, unspecified organism: Secondary | ICD-10-CM | POA: Diagnosis present

## 2021-07-10 DIAGNOSIS — N179 Acute kidney failure, unspecified: Secondary | ICD-10-CM | POA: Diagnosis present

## 2021-07-10 DIAGNOSIS — R0902 Hypoxemia: Secondary | ICD-10-CM | POA: Diagnosis present

## 2021-07-10 DIAGNOSIS — Z888 Allergy status to other drugs, medicaments and biological substances status: Secondary | ICD-10-CM | POA: Diagnosis not present

## 2021-07-10 DIAGNOSIS — Z79899 Other long term (current) drug therapy: Secondary | ICD-10-CM | POA: Diagnosis not present

## 2021-07-10 DIAGNOSIS — N1832 Chronic kidney disease, stage 3b: Secondary | ICD-10-CM | POA: Diagnosis present

## 2021-07-10 DIAGNOSIS — Z87891 Personal history of nicotine dependence: Secondary | ICD-10-CM | POA: Diagnosis not present

## 2021-07-10 DIAGNOSIS — Z8249 Family history of ischemic heart disease and other diseases of the circulatory system: Secondary | ICD-10-CM | POA: Diagnosis not present

## 2021-07-10 DIAGNOSIS — Z20822 Contact with and (suspected) exposure to covid-19: Secondary | ICD-10-CM | POA: Diagnosis present

## 2021-07-10 DIAGNOSIS — Z8551 Personal history of malignant neoplasm of bladder: Secondary | ICD-10-CM | POA: Diagnosis not present

## 2021-07-10 DIAGNOSIS — I10 Essential (primary) hypertension: Secondary | ICD-10-CM | POA: Diagnosis not present

## 2021-07-10 DIAGNOSIS — I480 Paroxysmal atrial fibrillation: Secondary | ICD-10-CM | POA: Diagnosis present

## 2021-07-10 DIAGNOSIS — R6520 Severe sepsis without septic shock: Secondary | ICD-10-CM | POA: Diagnosis present

## 2021-07-10 DIAGNOSIS — J9601 Acute respiratory failure with hypoxia: Secondary | ICD-10-CM | POA: Diagnosis present

## 2021-07-10 DIAGNOSIS — I129 Hypertensive chronic kidney disease with stage 1 through stage 4 chronic kidney disease, or unspecified chronic kidney disease: Secondary | ICD-10-CM | POA: Diagnosis present

## 2021-07-10 DIAGNOSIS — Z882 Allergy status to sulfonamides status: Secondary | ICD-10-CM | POA: Diagnosis not present

## 2021-07-10 DIAGNOSIS — A419 Sepsis, unspecified organism: Secondary | ICD-10-CM | POA: Diagnosis present

## 2021-07-10 DIAGNOSIS — Z7982 Long term (current) use of aspirin: Secondary | ICD-10-CM | POA: Diagnosis not present

## 2021-07-10 DIAGNOSIS — Z955 Presence of coronary angioplasty implant and graft: Secondary | ICD-10-CM | POA: Diagnosis not present

## 2021-07-10 LAB — RESPIRATORY PANEL BY PCR

## 2021-07-10 LAB — BASIC METABOLIC PANEL
Anion gap: 5 (ref 5–15)
BUN: 36 mg/dL — ABNORMAL HIGH (ref 8–23)
CO2: 33 mmol/L — ABNORMAL HIGH (ref 22–32)
Calcium: 8.8 mg/dL — ABNORMAL LOW (ref 8.9–10.3)
Chloride: 104 mmol/L (ref 98–111)
Creatinine, Ser: 1.95 mg/dL — ABNORMAL HIGH (ref 0.61–1.24)
GFR, Estimated: 34 mL/min — ABNORMAL LOW (ref >60)
Glucose, Bld: 121 mg/dL — ABNORMAL HIGH (ref 70–99)
Potassium: 4.2 mmol/L (ref 3.5–5.1)
Sodium: 142 mmol/L (ref 135–145)

## 2021-07-10 LAB — EXPECTORATED SPUTUM ASSESSMENT W GRAM STAIN, RFLX TO RESP C: Special Requests: NORMAL

## 2021-07-10 LAB — CBC
HCT: 40.8 % (ref 39.0–52.0)
Hemoglobin: 13.6 g/dL (ref 13.0–17.0)
MCH: 34.7 pg — ABNORMAL HIGH (ref 26.0–34.0)
MCHC: 33.3 g/dL (ref 30.0–36.0)
MCV: 104.1 fL — ABNORMAL HIGH (ref 80.0–100.0)
Platelets: 177 10*3/uL (ref 150–400)
RBC: 3.92 MIL/uL — ABNORMAL LOW (ref 4.22–5.81)
RDW: 14.5 % (ref 11.5–15.5)
WBC: 26.3 10*3/uL — ABNORMAL HIGH (ref 4.0–10.5)
nRBC: 0 % (ref 0.0–0.2)

## 2021-07-10 LAB — MRSA NEXT GEN BY PCR, NASAL: MRSA by PCR Next Gen: NOT DETECTED

## 2021-07-10 MED ORDER — ACETAMINOPHEN 500 MG PO TABS
1000.0000 mg | ORAL_TABLET | Freq: Four times a day (QID) | ORAL | Status: DC | PRN
  Administered 2021-07-10 (×2): 1000 mg via ORAL
  Filled 2021-07-10 (×3): qty 2

## 2021-07-10 MED ORDER — SODIUM CHLORIDE 0.9 % IV SOLN
2.0000 g | Freq: Two times a day (BID) | INTRAVENOUS | Status: DC
  Administered 2021-07-10 – 2021-07-12 (×4): 2 g via INTRAVENOUS
  Filled 2021-07-10 (×6): qty 12.5

## 2021-07-10 MED ORDER — ASPIRIN 81 MG PO TBEC
81.0000 mg | DELAYED_RELEASE_TABLET | Freq: Every day | ORAL | Status: DC
  Administered 2021-07-11: 81 mg via ORAL
  Filled 2021-07-10: qty 1

## 2021-07-10 MED ORDER — IRBESARTAN 150 MG PO TABS
150.0000 mg | ORAL_TABLET | Freq: Every day | ORAL | Status: DC
  Administered 2021-07-10: 150 mg via ORAL
  Filled 2021-07-10: qty 1

## 2021-07-10 MED ORDER — LACTATED RINGERS IV BOLUS (SEPSIS): 1000.0000 mL | Freq: Once | INTRAVENOUS | Status: DC

## 2021-07-10 MED ORDER — SODIUM CHLORIDE 0.9 % IV SOLN: INTRAVENOUS | Status: DC

## 2021-07-10 MED ORDER — ONDANSETRON HCL 4 MG PO TABS: 4.0000 mg | ORAL_TABLET | Freq: Four times a day (QID) | ORAL | Status: DC | PRN

## 2021-07-10 MED ORDER — PANTOPRAZOLE SODIUM 40 MG PO TBEC
40.0000 mg | DELAYED_RELEASE_TABLET | Freq: Every day | ORAL | Status: DC
  Administered 2021-07-11 – 2021-07-12 (×2): 40 mg via ORAL
  Filled 2021-07-10 (×2): qty 1

## 2021-07-10 MED ORDER — ONDANSETRON HCL 4 MG/2ML IJ SOLN: 4.0000 mg | Freq: Four times a day (QID) | INTRAMUSCULAR | Status: DC | PRN

## 2021-07-10 MED ORDER — HYDROCHLOROTHIAZIDE 25 MG PO TABS
25.0000 mg | ORAL_TABLET | Freq: Every day | ORAL | Status: DC
  Administered 2021-07-10: 25 mg via ORAL
  Filled 2021-07-10: qty 1

## 2021-07-10 NOTE — ED Notes (Signed)
RT note: Pt. returned to room #12 from RR on 2 lpm n/c with  O2 tank placed back to wall 02 @ 2lpm, pt./family member made aware to notify if needed. ?

## 2021-07-10 NOTE — H&P (Signed)
?History and Physical  ? ? ?Patient: Jason Gates. YQI:[id number] DOB: November 27, 1940 ?DOA: 07/09/2021 ?DOS: the patient was seen and examined on 07/10/2021 ?PCP: Pcp, No  ?Patient coming from: Home ? ?Chief Complaint:  ?Chief Complaint  ?Patient presents with  ? Shortness of Breath  ? ?HPI: Jason Gates. is a 81 y.o. male with medical history significant of paroxysmal atrial fibrillation, bladder cancer, stage IIIb chronic kidney disease, CAD, hypertension, hyperlipidemia, GERD who came to the emergency department due to progressively worse dyspnea that started early in the afternoon associated with productive cough pleuritic back pain and hypoxia 86% home.  The patient became febrile while in the emergency department.  He denied any travel history or sick contacts.  No chest pain, palpitations, diaphoresis, PND, orthopnea, abdominal pain, nausea, emesis, diarrhea, melena or hematochezia.  No flank pain, dysuria, frequency or hematuria.  No polyuria, polydipsia, polyphagia or blurred vision. ? ?ED course: Initial vital signs were temperature 98.3 ?F and subsequently 102.5 ?F 2 hours after arrival to the emergency department, initial pulse 110, respiratory rate 24, BP 136/64 and O2 sat 88% on room air.  O2 sat increased to the low to mid 90s on 2 L nasal cannula.  He received acetaminophen 1000 mg, cefepime and vancomycin in the emergency department. ? ?Lab work: His urinalysis was unremarkable.  CBC showed a white count 18.5, hemoglobin 16.6 g/dL with an MCV of 102.0 fL and platelets 204.  Lactic acid, lipase, troponin x2 and BNP were negative.  Coronavirus/influenza PCR and respiratory pathogen by PCR were negative.  Normal LFTs.  Initial BMP with normal electrolytes, glucose of 104, BUN 26 and creatinine 1.69 mg/dL. ? ?Imaging: Two-view chest radiograph with no active cardiopulmonary disease. ?  ?Review of Systems: As mentioned in the history of present illness. All other systems reviewed and are  negative. ?Past Medical History:  ?Diagnosis Date  ? Atrial fibrillation ([place])   ? Bladder cancer ([place])   ? Chronic kidney disease (CKD)   ? Coronary artery disease   ? ?Past Surgical History:  ?Procedure Laterality Date  ? CARDIAC SURGERY    ? CHOLECYSTECTOMY    ? CORONARY ANGIOPLASTY    ? "two stents in 2007"  ? EYE SURGERY    ? NASAL SEPTUM SURGERY    ? ?Social History:  reports that he quit smoking about 47 years ago. His smoking use included cigarettes. He has a 15.00 pack-year smoking history. He does not have any smokeless tobacco history on file. He reports that he does not currently use alcohol. He reports that he does not use drugs. ? ?Allergies  ?Allergen Reactions  ? Elemental Sulfur Other (See Comments)  ?  intolerance  ? Phosphorus Other (See Comments)  ?  intolerance  ? Amoxicillin Rash  ? ? ?Family History  ?Problem Relation Age of Onset  ? Obesity Mother   ? Diabetes Father   ? Heart attack Father   ? Heart disease Father   ? ? ?Prior to Admission medications   ?Medication Sig Start Date End Date Taking? Authorizing Provider  ?amLODipine (NORVASC) 5 MG tablet Take 5 mg by mouth at bedtime. 04/26/15  Yes [provider]  ?apixaban (ELIQUIS) 2.5 MG TABS tablet Take 2.5 mg by mouth 2 (two) times daily.   Yes [provider]  ?aspirin 81 MG tablet Take 81 mg by mouth at bedtime.   Yes [provider]  ?carboxymethylcellulose (REFRESH PLUS) 0.5 % SOLN Place 1 drop into the right  eye daily as needed (dry eyes).   Yes [provider]  ?Cholecalciferol (VITAMIN D) 125 MCG (5000 UT) CAPS Take 5,000 Units by mouth daily.   Yes [provider]  ?fluticasone (FLONASE) 50 MCG/ACT nasal spray Place 1 spray into both nostrils daily.   Yes [provider]  ?OVER THE COUNTER MEDICATION Take 1 tablet by mouth daily. Bladder 2.2   Yes [provider]  ?prednisoLONE acetate (PRED FORTE) 1 % ophthalmic suspension Place 1 drop into the left eye daily.   Yes  [provider]  ?RABEprazole (ACIPHEX) 20 MG tablet Take 1 tablet (20 mg total) by mouth daily. ?Patient taking differently: Take 20 mg by mouth at bedtime. 01/31/21 01/26/22 Yes End, Harrell Gave, MD  ?simvastatin (ZOCOR) 20 MG tablet Take 20 mg by mouth daily.   Yes [provider]  ?valACYclovir (VALTREX) 500 MG tablet Take 500 mg by mouth at bedtime. 06/12/15  Yes [provider]  ?valsartan-hydrochlorothiazide (DIOVAN-HCT) 160-25 MG tablet Take 1 tablet by mouth daily. 06/28/21 09/26/21 Yes End, Harrell Gave, MD  ? ? ?Physical Exam: ?Vitals:  ? 07/10/21 0900 07/10/21 0927 07/10/21 1010 07/10/21 1200  ?BP: 117/62 117/62 (!) 151/65 118/62  ?Pulse: 63  78 64  ?Resp: (!) 23  (!) 23 16  ?Temp:  98 ?F (36.7 ?C)  (!) 97.5 ?F (36.4 ?C)  ?TempSrc:  Axillary  Oral  ?SpO2: 97%  97%   ?Weight:      ?Height:      ? ?Physical Exam ?Vitals and nursing note reviewed.  ?Constitutional:   ?   General: He is awake.  ?   Appearance: He is obese. He is ill-appearing.  ?   Interventions: Nasal cannula in place.  ?HENT:  ?   Head: Normocephalic.  ?   Mouth/Throat:  ?   Mouth: Mucous membranes are dry.  ?Eyes:  ?   General: No scleral icterus. ?   Pupils: Pupils are equal, round, and reactive to light.  ?Neck:  ?   Vascular: No JVD.  ?Cardiovascular:  ?   Rate and Rhythm: Normal rate and regular rhythm.  ?   Heart sounds: S1 normal and S2 normal.  ?Pulmonary:  ?   Effort: Pulmonary effort is normal.  ?   Breath sounds: Examination of the right-lower field reveals decreased breath sounds. Examination of the left-lower field reveals decreased breath sounds. Decreased breath sounds, rhonchi and rales present. No wheezing.  ?Abdominal:  ?   General: Bowel sounds are normal. There is no distension.  ?   Palpations: Abdomen is soft.  ?   Tenderness: There is no abdominal tenderness. There is no right CVA tenderness, left CVA tenderness or guarding.  ?Musculoskeletal:  ?   Cervical back: Neck supple.  ?   Right lower leg:  1+ Pitting Edema present.  ?   Left lower leg: 1+ Pitting Edema present.  ?Skin: ?   General: Skin is warm and dry.  ?Neurological:  ?   General: No focal deficit present.  ?   Mental Status: He is alert and oriented to person, place, and time.  ?Psychiatric:     ?   Mood and Affect: Mood normal.     ?   Behavior: Behavior is cooperative.  ? ? ?Data Reviewed: ? ?There are no new results to review at this time. ? ?Assessment and Plan: ?Principal Problem: ?  Acute respiratory failure with hypoxia ([place]) ?In the setting of: ?  Sepsis due to undetermined organism POA (  [place]) ?Admit to telemetry/inpatient. ?Continue supplemental oxygen. ?Scheduled and as needed bronchodilators. ?Continue cefepime 2 g every 12 hours. ?Continue vancomycin per pharmacy. ?Check strep pneumoniae urinary antigen. ?Check sputum Gram stain, culture and sensitivity. ?Follow-up blood culture and sensitivity. ?Follow-up CBC and chemistry in the morning. ? ?Active Problems: ?  Coronary artery disease involving native  ?  coronary artery of native heart without angina pectoris ?On apixaban and simvastatin. ?Follow-up with cardiology as scheduled. ? ?  Paroxysmal atrial fibrillation ([place]) ?CHA?DS?-VASc Score of at least 4. ?Continue apixaban 2.5 mg p.o. twice daily. ? ?  Hyperlipidemia LDL goal <70 ?Continue simvastatin 20 mg p.o. daily. ? ?  Essential hypertension ?Continue amlodipine 5 mg p.o. daily. ?Continue valsartan or formulary equivalent. ?Continue HCTZ 25 mg p.o. daily. ? ?  Gastroesophageal reflux disease ?Pantoprazole 40 mg p.o. daily. ? ?  Stage 3b chronic kidney disease (CKD) ([place]) ?Monitor renal function electrolytes. ? ?  Macrocytosis ?Check I50 and folic acid level. ? ? ? ? Advance Care Planning:   Code Status: Full Code  ? ?Consults:  ? ?Family Communication:  ? ?Severity of Illness: ?The appropriate patient status for this patient is INPATIENT. Inpatient status is judged to be reasonable and necessary in order to provide the required  intensity of service to ensure the patient's safety. The patient's presenting symptoms, physical exam findings, and initial radiographic and laboratory data in the context of their chronic comorbidities is felt to pl

## 2021-07-10 NOTE — ED Provider Notes (Signed)
Observation note ? ?Patient being observed in the emergency department awaiting bed placement.  Patient presented with shortness of breath and developed fever in addition to productive cough.  Patient had sepsis screening initiated with broad IV antibiotics.  Chest x-ray overall was unremarkable.  Patient's white blood cell count increased to 22,000 yesterday evening, fever resolved.  Patient's oxygen saturation 88 to 89% without oxygen, patient on 1 to 2 L nasal cannula.  Discussed that patient still requires admission and will wait for bed placement.  Discussed this with patient and family member. Viral testing sent. ? ?Exam normal work of breathing, mild dry mucous membranes, normal heart rate, normal oxygenation on nasal cannula.  General weakness lying in bed.  No confusion.  Skin dry. ? ?Fever, unspecified fever cause ? ?Hypoxia ? ?SIRS ?  ?Elnora Morrison, MD ?07/10/21 [phone] ? ?

## 2021-07-10 NOTE — ED Notes (Signed)
RT note: Pt. wanted to walk to BR, (U/A already obtained)currently on n/c 2lpm, this RT obtained O2 tank, placed on 2 lpm for safety, RT to monitor.  ?

## 2021-07-10 NOTE — Progress Notes (Signed)
Patient refuses the use of nocturnal CPAP tonight. RT will continue to follow and encourage use.  ?

## 2021-07-10 NOTE — ED Notes (Signed)
Pt states he is feeling better and would like for the EDP to consider sending him home. Pt requests a trial to see what his O2 level does without oxygen.  ?

## 2021-07-11 ENCOUNTER — Inpatient Hospital Stay (HOSPITAL_COMMUNITY): Payer: Medicare Other

## 2021-07-11 DIAGNOSIS — A419 Sepsis, unspecified organism: Secondary | ICD-10-CM | POA: Diagnosis not present

## 2021-07-11 LAB — CBC WITH DIFFERENTIAL/PLATELET
Abs Immature Granulocytes: 0.1 10*3/uL — ABNORMAL HIGH (ref 0.00–0.07)
Basophils Absolute: 0.1 10*3/uL (ref 0.0–0.1)
Basophils Relative: 0 %
Eosinophils Absolute: 0.2 10*3/uL (ref 0.0–0.5)
Eosinophils Relative: 1 %
HCT: 41 % (ref 39.0–52.0)
Hemoglobin: 13.4 g/dL (ref 13.0–17.0)
Immature Granulocytes: 1 %
Lymphocytes Relative: 15 %
Lymphs Abs: 2.8 10*3/uL (ref 0.7–4.0)
MCH: 34.2 pg — ABNORMAL HIGH (ref 26.0–34.0)
MCHC: 32.7 g/dL (ref 30.0–36.0)
MCV: 104.6 fL — ABNORMAL HIGH (ref 80.0–100.0)
Monocytes Absolute: 1.1 10*3/uL — ABNORMAL HIGH (ref 0.1–1.0)
Monocytes Relative: 6 %
Neutro Abs: 14.4 10*3/uL — ABNORMAL HIGH (ref 1.7–7.7)
Neutrophils Relative %: 77 %
Platelets: 177 10*3/uL (ref 150–400)
RBC: 3.92 MIL/uL — ABNORMAL LOW (ref 4.22–5.81)
RDW: 14.5 % (ref 11.5–15.5)
WBC: 18.7 10*3/uL — ABNORMAL HIGH (ref 4.0–10.5)
nRBC: 0 % (ref 0.0–0.2)

## 2021-07-11 LAB — BASIC METABOLIC PANEL
Anion gap: 5 (ref 5–15)
BUN: 30 mg/dL — ABNORMAL HIGH (ref 8–23)
CO2: 26 mmol/L (ref 22–32)
Calcium: 8.6 mg/dL — ABNORMAL LOW (ref 8.9–10.3)
Chloride: 109 mmol/L (ref 98–111)
Creatinine, Ser: 1.63 mg/dL — ABNORMAL HIGH (ref 0.61–1.24)
GFR, Estimated: 42 mL/min — ABNORMAL LOW (ref >60)
Glucose, Bld: 110 mg/dL — ABNORMAL HIGH (ref 70–99)
Potassium: 3.6 mmol/L (ref 3.5–5.1)
Sodium: 140 mmol/L (ref 135–145)

## 2021-07-11 LAB — FOLATE: Folate: 16.8 ng/mL (ref >5.9)

## 2021-07-11 LAB — VITAMIN B12: Vitamin B-12: 269 pg/mL (ref 180–914)

## 2021-07-11 LAB — STREP PNEUMONIAE URINARY ANTIGEN: Strep Pneumo Urinary Antigen: NEGATIVE

## 2021-07-11 MED ORDER — HYDRALAZINE HCL 20 MG/ML IJ SOLN: 10.0000 mg | Freq: Four times a day (QID) | INTRAMUSCULAR | Status: DC | PRN

## 2021-07-11 MED ORDER — SODIUM CHLORIDE 0.9 % IV SOLN: INTRAVENOUS | Status: AC

## 2021-07-11 MED ORDER — ORAL CARE MOUTH RINSE: 15.0000 mL | Freq: Two times a day (BID) | OROMUCOSAL | Status: DC

## 2021-07-11 MED ORDER — CHLORHEXIDINE GLUCONATE 0.12 % MT SOLN
15.0000 mL | Freq: Two times a day (BID) | OROMUCOSAL | Status: DC
  Administered 2021-07-11 – 2021-07-12 (×2): 15 mL via OROMUCOSAL
  Filled 2021-07-11 (×3): qty 15

## 2021-07-11 MED ORDER — VITAMIN B-12 100 MCG PO TABS
100.0000 ug | ORAL_TABLET | Freq: Every day | ORAL | Status: DC
  Administered 2021-07-12: 100 ug via ORAL
  Filled 2021-07-11 (×2): qty 1

## 2021-07-11 MED ORDER — ASPIRIN 81 MG PO TBEC
81.0000 mg | DELAYED_RELEASE_TABLET | Freq: Every day | ORAL | Status: DC
Start: 2021-07-12 — End: 2021-07-12
  Administered 2021-07-12: 81 mg via ORAL
  Filled 2021-07-11: qty 1

## 2021-07-11 NOTE — Evaluation (Signed)
Clinical/Bedside Swallow Evaluation Patient Details  Name: Jason Gates. MRN: [id number] Date of Birth: 1940/04/14  Today's Date: 07/11/2021 Time: SLP Start Time (ACUTE ONLY): 1330 SLP Stop Time (ACUTE ONLY): 2633 SLP Time Calculation (min) (ACUTE ONLY): 13 min  Past Medical History:  Past Medical History:  Diagnosis Date   Atrial fibrillation ([place])    Bladder cancer ([place])    Chronic kidney disease (CKD)    Coronary artery disease    Past Surgical History:  Past Surgical History:  Procedure Laterality Date   CARDIAC SURGERY     CHOLECYSTECTOMY     CORONARY ANGIOPLASTY     "two stents in 2007"   EYE SURGERY     NASAL SEPTUM SURGERY     HPI:  Jason Gates. is a 81 y.o. male with medical history significant of paroxysmal atrial fibrillation, bladder cancer, stage IIIb chronic kidney disease, CAD, hypertension, hyperlipidemia, GERD who came to the emergency department due to progressively worse dyspnea that started early in the afternoon associated with productive cough pleuritic back pain and hypoxia 86% home.  The patient became febrile while in the emergency department.    Assessment / Plan / Recommendation  Clinical Impression  Pt presents with no immediate signs of aspiration. Son reports pt has been observed to have chronic throat clearing at home that the pt didnt even notice until family mentioned it. 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 Given constellation of symptoms suggesting a primary esophageal dysphagia, would suggest a dedicated esophageal study. SLP will follow chart for results and needs. SLP Visit Diagnosis: Dysphagia, unspecified (R13.10)    Aspiration Risk  Mild aspiration risk    Diet Recommendation Regular;Thin liquid   Liquid Administration via: Cup;Straw Medication Administration: Whole meds with liquid Supervision: Patient able to self  feed Compensations: Slow rate;Small sips/bites Postural Changes: Seated upright at 90 degrees    Other  Recommendations Recommended Consults: Consider esophageal assessment Oral Care Recommendations: Oral care BID    Recommendations for follow up therapy are one component of a multi-disciplinary discharge planning process, led by the attending physician.  Recommendations may be updated based on patient status, additional functional criteria and insurance authorization.  Follow up Recommendations No SLP follow up      Assistance Recommended at Discharge    Functional Status Assessment Patient has had a recent decline in their functional status and demonstrates the ability to make significant improvements in function in a reasonable and predictable amount of time.  Frequency and Duration min 1 x/week  1 week       Prognosis        Swallow Study   General HPI: Jason Gates. is a 81 y.o. male with medical history significant of paroxysmal atrial fibrillation, bladder cancer, stage IIIb chronic kidney disease, CAD, hypertension, hyperlipidemia, GERD who came to the emergency department due to progressively worse dyspnea that started early in the afternoon associated with productive cough pleuritic back pain and hypoxia 86% home.  The patient became febrile while in the emergency department. Type of Study: Bedside Swallow Evaluation Diet Prior to this Study: Regular;Thin liquids Temperature Spikes Noted: No Respiratory Status: Room air History of Recent Intubation: No Behavior/Cognition: Alert;Cooperative;Pleasant mood Oral Cavity Assessment: Within Functional Limits Oral Care Completed by SLP: No Oral Cavity - Dentition: Adequate natural dentition Vision: Functional for self-feeding Self-Feeding Abilities: Able to feed self Patient  Positioning: Upright in bed Baseline Vocal Quality: Normal Volitional Cough: Strong Volitional Swallow: Able to elicit    Oral/Motor/Sensory  Function Overall Oral Motor/Sensory Function: Within functional limits   Ice Chips     Thin Liquid Thin Liquid: Within functional limits    Nectar Thick Nectar Thick Liquid: Not tested   Honey Thick Honey Thick Liquid: Not tested   Puree Puree: Not tested   Solid     Solid: Within functional limits      Raychelle Hudman, Katherene Ponto 07/11/2021,1:51 PM

## 2021-07-11 NOTE — Progress Notes (Signed)
Pt refused CPAP qhs.  Pt wishes to wear nocturnal O2 instead.

## 2021-07-11 NOTE — Progress Notes (Signed)
PROGRESS NOTE    Jason Gates.  WIO:[id number] DOB: 04-20-1940 DOA: 07/09/2021 PCP: Pcp, No   Brief Narrative: 81 year old with past medical history significant for paroxysmal A-fib, bladder cancer, stage IIIb CKD, CAD, hypertension, GERD who presents with progressive dyspnea that is started afternoon of admission associated with cough, pleuritic and back pain.  He was found to be hypoxic with oxygen saturation 86%.  He presented febrile with a temperature of 102, tachycardic tachypneic respiration rate 24.  He was placed on 2 L of oxygen and oxygen sat improved to 90%.  COVID-19 PCR negative chest x-ray no active cardiopulmonary disease UA unremarkable.   Assessment & Plan:   Principal Problem:   Sepsis ([place]) Active Problems:   Coronary artery disease involving native coronary artery of native heart without angina pectoris   Paroxysmal atrial fibrillation (HCC)   Hyperlipidemia LDL goal <70   Essential hypertension   Gastroesophageal reflux disease   Acute respiratory failure with hypoxia (HCC)   Stage 3b chronic kidney disease (CKD) (HCC)   Macrocytosis   1-Acute Hypoxic Respiratory Failure: Patient was found to have oxygen saturation 88 on room air.  Placed on 2 L of oxygen. Probably related to pneumonia. Wean off oxygen as tolerated   2-Sepsis secondary to pneumonia: Patient presented with fever, tachypnea respiration rate 31, Tachycardic, Leukocytosis. 26. AKI.  COVID PCR negative Repeated chest x ray: Vague increased interstitial prominence in the right lung relative to the left. Findings may reflect an underlying infectious/inflammatory process.  Low lung volumes with bibasilar airspace opacities favored to reflect atelectasis. MRSA PCR negative.  Discontinue vancomycin Continue with cefepime Follow sputum culture.  Check strep pneumonia antigen and Legionella antigen  3-Dysphagia: Proceed with esophagogram Continue with PPI Speech evaluation, dysphagia  consistent with primary esophageal problem.   4-paroxysmal atrial fibrillation: Continue with Eliquis  Hyperlipidemia: Continue with simvastatin Hypertension: Continue with amlodipine, hold losartan and hydrochlorothiazide due to AKI -PRN Hydralazine.   AKI on CKD stage IIIb: Presented with a creatinine of 1.9 prior creatinine 1.6 Continue with gentle hydration  Macrocytosis. B12 low normal. Start supplement.       Estimated body mass index is 34.36 kg/m as calculated from the following:   Height as of this encounter: '5\' 8"'$  (1.727 m).   Weight as of this encounter: 102.5 kg.   DVT prophylaxis: Eliquis Code Status: Full code Family Communication: care discussed with patient.  Disposition Plan:  Status is: Inpatient Remains inpatient appropriate because: management of sepsis, PNA    Consultants:  None  Procedures:  Esophagogram  Antimicrobials:    Subjective: He report cough, sometimes food get stuck throat.  He is breathing better.   Objective: Vitals:   07/10/21 1536 07/10/21 2045 07/11/21 0010 07/11/21 0410  BP: 133/67 (!) 140/57 122/66 139/71  Pulse: 70 85 73 68  Resp: '20 18 18 18  '$ Temp: 97.9 F (36.6 C) 98.1 F (36.7 C) 98.2 F (36.8 C) 97.6 F (36.4 C)  TempSrc: Oral Oral Oral Oral  SpO2: 93% 92% 95% 96%  Weight:      Height:        Intake/Output Summary (Last 24 hours) at 07/11/2021 0751 Last data filed at 07/11/2021 0517 Gross per 24 hour  Intake 1786.97 ml  Output 700 ml  Net 1086.97 ml   Filed Weights   07/09/21 1835  Weight: 102.5 kg    Examination:  General exam: Appears calm and comfortable  Respiratory system: BL crackles.  Cardiovascular system: S1 & S2 heard,  RRR.  Gastrointestinal system: Abdomen is nondistended, soft and nontender. No organomegaly or masses felt. Normal bowel sounds heard. Central nervous system: Alert and oriented. No focal neurological deficits. Extremities: Symmetric 5 x 5 power.    Data Reviewed:  I have personally reviewed following labs and imaging studies  CBC: Recent Labs  Lab 07/09/21 1853 07/09/21 2309 07/10/21 1211 07/11/21 0409  WBC 18.5* 22.4* 26.3* 18.7*  NEUTROABS  --  20.3*  --  14.4*  HGB 16.7 14.1 13.6 13.4  HCT 49.8 41.9 40.8 41.0  MCV 102.0* 101.7* 104.1* 104.6*  PLT 204 203 177 101   Basic Metabolic Panel: Recent Labs  Lab 07/09/21 1853 07/10/21 1211 07/11/21 0623  NA 139 142 140  K 4.7 4.2 3.6  CL 102 104 109  CO2 23 33* 26  GLUCOSE 104* 121* 110*  BUN 26* 36* 30*  CREATININE 1.69* 1.95* 1.63*  CALCIUM 9.9 8.8* 8.6*   GFR: Estimated Creatinine Clearance: 40.5 mL/min (A) (by C-G formula based on SCr of 1.63 mg/dL (H)). Liver Function Tests: Recent Labs  Lab 07/09/21 1853  AST 27  ALT 19  ALKPHOS 47  BILITOT 0.8  PROT 6.8  ALBUMIN 4.3   Recent Labs  Lab 07/09/21 1853  LIPASE 17   No results for input(s): AMMONIA in the last 168 hours. Coagulation Profile: No results for input(s): INR, PROTIME in the last 168 hours. Cardiac Enzymes: No results for input(s): CKTOTAL, CKMB, CKMBINDEX, TROPONINI in the last 168 hours. BNP (last 3 results) No results for input(s): PROBNP in the last 8760 hours. HbA1C: No results for input(s): HGBA1C in the last 72 hours. CBG: No results for input(s): GLUCAP in the last 168 hours. Lipid Profile: No results for input(s): CHOL, HDL, LDLCALC, TRIG, CHOLHDL, LDLDIRECT in the last 72 hours. Thyroid Function Tests: No results for input(s): TSH, T4TOTAL, FREET4, T3FREE, THYROIDAB in the last 72 hours. Anemia Panel: Recent Labs    07/11/21 0409  VITAMINB12 269  FOLATE 16.8   Sepsis Labs: Recent Labs  Lab 07/09/21 2042  LATICACIDVEN 1.2    Recent Results (from the past 240 hour(s))  Resp Panel by RT-PCR (Flu A&B, Covid) Nasopharyngeal Swab     Status: None   Collection Time: 07/09/21  7:57 PM   Specimen: Nasopharyngeal Swab; Nasopharyngeal(NP) swabs in vial transport medium  Result Value Ref  Range Status   SARS Coronavirus 2 by RT PCR NEGATIVE NEGATIVE Final    Comment: (NOTE) SARS-CoV-2 target nucleic acids are NOT DETECTED.  The SARS-CoV-2 RNA is generally detectable in upper respiratory specimens during the acute phase of infection. The lowest concentration of SARS-CoV-2 viral copies this assay can detect is 138 copies/mL. A negative result does not preclude SARS-Cov-2 infection and should not be used as the sole basis for treatment or other patient management decisions. A negative result may occur with  improper specimen collection/handling, submission of specimen other than nasopharyngeal swab, presence of viral mutation(s) within the areas targeted by this assay, and inadequate number of viral copies(<138 copies/mL). A negative result must be combined with clinical observations, patient history, and epidemiological information. The expected result is Negative.  Fact Sheet for Patients:  EntrepreneurPulse.com.au  Fact Sheet for Healthcare Providers:  IncredibleEmployment.be  This test is no t yet approved or cleared by the [place] FDA and  has been authorized for detection and/or diagnosis of SARS-CoV-2 by FDA under an Emergency Use Authorization (EUA). This EUA will remain  in effect (meaning this test can be used)  for the duration of the COVID-19 declaration under Section 564(b)(1) of the Act, 21 U.S.C.section 360bbb-3(b)(1), unless the authorization is terminated  or revoked sooner.       Influenza A by PCR NEGATIVE NEGATIVE Final   Influenza B by PCR NEGATIVE NEGATIVE Final    Comment: (NOTE) The Xpert Xpress SARS-CoV-2/FLU/RSV plus assay is intended as an aid in the diagnosis of influenza from Nasopharyngeal swab specimens and should not be used as a sole basis for treatment. Nasal washings and aspirates are unacceptable for Xpert Xpress SARS-CoV-2/FLU/RSV testing.  Fact Sheet for  Patients: EntrepreneurPulse.com.au  Fact Sheet for Healthcare Providers: IncredibleEmployment.be  This test is not yet approved or cleared by the [place] FDA and has been authorized for detection and/or diagnosis of SARS-CoV-2 by FDA under an Emergency Use Authorization (EUA). This EUA will remain in effect (meaning this test can be used) for the duration of the COVID-19 declaration under Section 564(b)(1) of the Act, 21 U.S.C. section 360bbb-3(b)(1), unless the authorization is terminated or revoked.  Performed at [facility], [address] [place], [place] [address]   Culture, blood (routine x 2)     Status: None (Preliminary result)   Collection Time: 07/09/21 11:05 PM   Specimen: BLOOD  Result Value Ref Range Status   Specimen Description   Final    BLOOD Performed at [facility], [address] [place], [place] [address]    Special Requests   Final    NONE Performed at [facility], [address], [place], [place] [address]    Culture   Final    NO GROWTH < 24 HOURS Performed at [facility], [place] [address] [place], [place] [address]    Report Status PENDING  Incomplete  Culture, blood (routine x 2)     Status: None (Preliminary result)   Collection Time: 07/09/21 11:09 PM   Specimen: BLOOD  Result Value Ref Range Status   Specimen Description   Final    BLOOD Performed at [facility], [address] [place], [place] [address]    Special Requests   Final    NONE Performed at [facility], [address] [place], [place] [address]    Culture   Final    NO GROWTH < 24 HOURS Performed at [facility], [place] [address], [place], [place] [address]    Report Status PENDING  Incomplete  Respiratory (~20 pathogens) panel by PCR     Status: None   Collection Time: 07/10/21  8:43 AM   Specimen: Nasopharyngeal  Swab; Respiratory  Result Value Ref Range Status   Adenovirus NOT DETECTED NOT DETECTED Final   Coronavirus 229E NOT DETECTED NOT DETECTED Final    Comment: (NOTE) The Coronavirus on the Respiratory Panel, DOES NOT test for the novel  Coronavirus (2019 nCoV)    Coronavirus HKU1 NOT DETECTED NOT DETECTED Final   Coronavirus NL63 NOT DETECTED NOT DETECTED Final   Coronavirus OC43 NOT DETECTED NOT DETECTED Final   Metapneumovirus NOT DETECTED NOT DETECTED Final   Rhinovirus / Enterovirus NOT DETECTED NOT DETECTED Final   Influenza A NOT DETECTED NOT DETECTED Final   Influenza B NOT DETECTED NOT DETECTED Final   Parainfluenza Virus 1 NOT DETECTED NOT DETECTED Final   Parainfluenza Virus 2 NOT DETECTED NOT DETECTED Final   Parainfluenza Virus 3 NOT DETECTED NOT DETECTED Final   Parainfluenza Virus 4 NOT DETECTED NOT DETECTED Final   Respiratory Syncytial Virus NOT  DETECTED NOT DETECTED Final   Bordetella pertussis NOT DETECTED NOT DETECTED Final   Bordetella Parapertussis NOT DETECTED NOT DETECTED Final   Chlamydophila pneumoniae NOT DETECTED NOT DETECTED Final   Mycoplasma pneumoniae NOT DETECTED NOT DETECTED Final    Comment: Performed at [facility], [place] [address] [place], [place] [address]  MRSA Next Gen by PCR, Nasal     Status: None   Collection Time: 07/10/21  4:37 PM   Specimen: Nasal Mucosa; Nasal Swab  Result Value Ref Range Status   MRSA by PCR Next Gen NOT DETECTED NOT DETECTED Final    Comment: (NOTE) The GeneXpert MRSA Assay (FDA approved for NASAL specimens only), is one component of a comprehensive MRSA colonization surveillance program. It is not intended to diagnose MRSA infection nor to guide or monitor treatment for MRSA infections. Test performance is not FDA approved in patients less than 28 years old. Performed at [facility], [place] [address] [place], [place] [address]   Expectorated Sputum Assessment w Gram Stain, Rflx to Resp  Cult     Status: None   Collection Time: 07/10/21  7:21 PM   Specimen: Sputum  Result Value Ref Range Status   Specimen Description SPUTUM  Final   Special Requests Normal  Final   Sputum evaluation   Final    THIS SPECIMEN IS ACCEPTABLE FOR SPUTUM CULTURE Performed at [facility], [place] [address] [place], [place] [address]    Report Status 07/10/2021 FINAL  Final  Culture, Respiratory w Gram Stain     Status: None (Preliminary result)   Collection Time: 07/10/21  7:21 PM   Specimen: SPU  Result Value Ref Range Status   Specimen Description   Final    SPUTUM Performed at [place] [address], [place], [place] [address]    Special Requests   Final    Normal Reflexed from [id number] Performed at [facility], [place] [address] [place], [place] [address]    Gram Stain   Final    RARE WBC PRESENT,BOTH PMN AND MONONUCLEAR RARE GRAM POSITIVE COCCI IN PAIRS RARE GRAM POSITIVE COCCI IN TETRADS Performed at [facility], [place] [address] [place], [place] [address]    Culture PENDING  Incomplete   Report Status PENDING  Incomplete         Radiology Studies: DG Chest 2 View  Result Date: 07/09/2021 CLINICAL DATA:  Acute shortness of breath and chest pain beginning this afternoon. Hypoxia. EXAM: CHEST - 2 VIEW COMPARISON:  07/18/2015 FINDINGS: Lordotic positioning noted. The heart size and mediastinal contours are within normal limits. Both lungs are clear. The visualized skeletal structures are unremarkable. IMPRESSION: No active cardiopulmonary disease. Electronically Signed   By: Marlaine Hind M.D.   On: 07/09/2021 19:42        Scheduled Meds:  amLODipine  5 mg Oral Daily   apixaban  2.5 mg Oral BID   aspirin EC  81 mg Oral Daily   chlorhexidine  15 mL Mouth Rinse BID   hydrochlorothiazide  25 mg Oral Daily   irbesartan  150 mg Oral Daily   mouth rinse  15 mL Mouth Rinse q12n4p   pantoprazole  40 mg Oral  Daily   simvastatin  20 mg Oral Daily   valACYclovir  500 mg Oral Daily   Continuous Infusions:  sodium chloride 100 mL/hr at 07/11/21 0144   ceFEPime (MAXIPIME) IV 2 g (07/11/21 0146)  lactated ringers     vancomycin 750 mg (07/10/21 2236)     LOS: 1 day    Time spent: 35 minutes.     Elmarie Shiley, MD [facility]   If 7PM-7AM, please contact night-coverage [email or web address]  07/11/2021, 7:51 AM

## 2021-07-12 ENCOUNTER — Inpatient Hospital Stay (HOSPITAL_COMMUNITY): Payer: Medicare Other

## 2021-07-12 DIAGNOSIS — A419 Sepsis, unspecified organism: Secondary | ICD-10-CM | POA: Diagnosis not present

## 2021-07-12 LAB — BASIC METABOLIC PANEL
Anion gap: 8 (ref 5–15)
BUN: 24 mg/dL — ABNORMAL HIGH (ref 8–23)
CO2: 24 mmol/L (ref 22–32)
Calcium: 8.8 mg/dL — ABNORMAL LOW (ref 8.9–10.3)
Chloride: 109 mmol/L (ref 98–111)
Creatinine, Ser: 1.55 mg/dL — ABNORMAL HIGH (ref 0.61–1.24)
GFR, Estimated: 44 mL/min — ABNORMAL LOW (ref >60)
Glucose, Bld: 110 mg/dL — ABNORMAL HIGH (ref 70–99)
Potassium: 3.7 mmol/L (ref 3.5–5.1)
Sodium: 141 mmol/L (ref 135–145)

## 2021-07-12 LAB — CBC
HCT: 40.5 % (ref 39.0–52.0)
Hemoglobin: 13.4 g/dL (ref 13.0–17.0)
MCH: 34.6 pg — ABNORMAL HIGH (ref 26.0–34.0)
MCHC: 33.1 g/dL (ref 30.0–36.0)
MCV: 104.7 fL — ABNORMAL HIGH (ref 80.0–100.0)
Platelets: 188 10*3/uL (ref 150–400)
RBC: 3.87 MIL/uL — ABNORMAL LOW (ref 4.22–5.81)
RDW: 14.3 % (ref 11.5–15.5)
WBC: 14.1 10*3/uL — ABNORMAL HIGH (ref 4.0–10.5)
nRBC: 0 % (ref 0.0–0.2)

## 2021-07-12 MED ORDER — CYANOCOBALAMIN 100 MCG PO TABS
100.0000 ug | ORAL_TABLET | Freq: Every day | ORAL | 0 refills | Status: DC
Start: 2021-07-13 — End: 2021-11-26

## 2021-07-12 MED ORDER — CEFDINIR 300 MG PO CAPS
300.0000 mg | ORAL_CAPSULE | Freq: Two times a day (BID) | ORAL | Status: DC
  Administered 2021-07-12: 300 mg via ORAL
  Filled 2021-07-12: qty 1

## 2021-07-12 MED ORDER — CEFDINIR 300 MG PO CAPS: 300.0000 mg | ORAL_CAPSULE | Freq: Two times a day (BID) | ORAL | 0 refills | Status: AC

## 2021-07-12 NOTE — Plan of Care (Signed)

## 2021-07-12 NOTE — Progress Notes (Signed)
SLP Cancellation Note  Patient Details Name: Jason Gates. MRN: [id number] DOB: [birth year]   Cancelled treatment:       Reason Eval/Treat Not Completed: Other (comment). No oropharyngeal abnormality on esophagram, only dysmotility. Will sign off.    Elsi Stelzer, Katherene Ponto 07/12/2021, 10:05 AM

## 2021-07-12 NOTE — Progress Notes (Signed)
PT Cancellation Note  Patient Details Name: Jason Gates. MRN: [id number] DOB: 05/01/1940   Cancelled Treatment:    Reason Eval/Treat Not Completed: PT screened, no needs identified, will sign off (Pt is mobilizing in room at baseline of independent with no device. No acute PT needed.)  Verner Mould, DPT Acute Rehabilitation Services Office [phone] Pager [phone]  07/12/21 1:33 PM

## 2021-07-12 NOTE — Discharge Summary (Addendum)
Physician Discharge Summary   Patient: Jason Gates. MRN: [id number] DOB: 10-06-1940  Admit date:     07/09/2021  Discharge date: 07/12/21  Discharge Physician: Jerald Kief A Allis Quirarte   PCP: Pcp, No   Recommendations at discharge:   Check renal renal function.  Follow on resolution of PNA>  Follow legionella antigen.   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 *  Hospital Course: 81 year old with past medical history significant for paroxysmal A-fib, bladder cancer, stage IIIb CKD, CAD, hypertension, GERD who presents with progressive dyspnea that is started afternoon of admission associated with cough, pleuritic and back pain.  He was found to be hypoxic with oxygen saturation 86%.  He presented febrile with a temperature of 102, tachycardic tachypneic respiration rate 24.  He was placed on 2 L of oxygen and oxygen sat improved to 90%.   COVID-19 PCR negative chest x-ray no active cardiopulmonary disease UA unremarkable.    Assessment and Plan:    1-Acute Hypoxic Respiratory Failure: Patient was found to have oxygen saturation 88 on room air.  Placed on 2 L of oxygen. Probably related to pneumonia. Wean off oxygen as tolerated He has been off oxygen.    2-Severe Sepsis secondary to pneumonia: POA> Ruled in.  Patient presented with fever, tachypnea respiration rate 31, Tachycardic, Leukocytosis. 26. AKI.  COVID PCR negative Repeated chest x ray: Vague increased interstitial prominence in the right lung relative to the left. Findings may reflect an  underlying infectious/inflammatory process.  Low lung volumes with bibasilar airspace opacities favored to reflect atelectasis. MRSA PCR negative.  Discontinue vancomycin Treated  with cefepime while in the hospital. Discharge on Cefdinir for 4 days.  Follow sputum culture.   strep pneumonia antigen negative and Legionella antigen pending WBC down to 14, afebrile. Stable for discharge.   3-Dysphagia: Esophagogram; esophageal dysmotility disorder. Advised small frequent meal, soft, food.  Continue with PPI Speech evaluation, dysphagia consistent with primary esophageal problem.  Needs follow up with GI>   4-paroxysmal atrial fibrillation: Continue with Eliquis   Hyperlipidemia: Continue with simvastatin Hypertension: Continue with amlodipine, resume  losartan and hydrochlorothiazide tomorrow. Renal function stable. Better than previous baseline.  He has appointment with nephrology  -PRN Hydralazine.    AKI on CKD stage IIIb: Presented with a creatinine of 1.9 prior creatinine 1.6 Received IV fluids.  Cr stable.  Ok to resume HCTZ/Diovan he has been on this medication for some time and renal function was stable around 1.6. he has an appointment next week with his nephrologist. Will defer to nephrologist change of medications.    Macrocytosis. B12 low normal. Started supplement.              Consultants: None Procedures performed: None Disposition: Home Diet recommendation:  Discharge Diet Orders (From admission, onward)     Start     Ordered   07/12/21 0000  Diet - low sodium heart healthy        07/12/21 1114           Cardiac diet DISCHARGE MEDICATION: Allergies as of 07/12/2021  Reactions   Elemental Sulfur Other (See Comments)   intolerance   Phosphorus Other (See Comments)   intolerance   Amoxicillin Rash        Medication List     TAKE these medications    amLODipine 5 MG tablet Commonly known as: NORVASC Take 5 mg by mouth at bedtime.    apixaban 2.5 MG Tabs tablet Commonly known as: ELIQUIS Take 2.5 mg by mouth 2 (two) times daily.   aspirin 81 MG tablet Take 81 mg by mouth at bedtime.   carboxymethylcellulose 0.5 % Soln Commonly known as: REFRESH PLUS Place 1 drop into the right eye daily as needed (dry eyes).   cefdinir 300 MG capsule Commonly known as: OMNICEF Take 1 capsule (300 mg total) by mouth every 12 (twelve) hours for 4 days.   cyanocobalamin 100 MCG tablet Take 1 tablet (100 mcg total) by mouth daily. Start taking on: Jul 13, 2021   fluticasone 50 MCG/ACT nasal spray Commonly known as: FLONASE Place 1 spray into both nostrils daily.   OVER THE COUNTER MEDICATION Take 1 tablet by mouth daily. Bladder 2.2   prednisoLONE acetate 1 % ophthalmic suspension Commonly known as: PRED FORTE Place 1 drop into the left eye daily.   RABEprazole 20 MG tablet Commonly known as: ACIPHEX Take 1 tablet (20 mg total) by mouth daily. What changed: when to take this   simvastatin 20 MG tablet Commonly known as: ZOCOR Take 20 mg by mouth daily.   valACYclovir 500 MG tablet Commonly known as: VALTREX Take 500 mg by mouth at bedtime.   valsartan-hydrochlorothiazide 160-25 MG tablet Commonly known as: DIOVAN-HCT Take 1 tablet by mouth daily.   Vitamin D 125 MCG (5000 UT) Caps Take 5,000 Units by mouth daily.        Discharge Exam: Filed Weights   07/09/21 1835  Weight: 102.5 kg   General; NAD\Lung; BL ronchus.   He is feeling well, tired , he has not been able to rest here in the hospital.   Condition at discharge: stable  The results of significant diagnostics from this hospitalization (including imaging, microbiology, ancillary and laboratory) are listed below for reference.   Imaging Studies: DG Chest 2 View  Result Date: 07/09/2021 CLINICAL DATA:  Acute shortness of breath and chest pain beginning this afternoon. Hypoxia. EXAM: CHEST - 2 VIEW COMPARISON:  07/18/2015 FINDINGS: Lordotic  positioning noted. The heart size and mediastinal contours are within normal limits. Both lungs are clear. The visualized skeletal structures are unremarkable. IMPRESSION: No active cardiopulmonary disease. Electronically Signed   By: Marlaine Hind M.D.   On: 07/09/2021 19:42   DG CHEST PORT 1 VIEW  Result Date: 07/11/2021 CLINICAL DATA:  Fever, bladder cancer EXAM: PORTABLE CHEST 1 VIEW COMPARISON:  Chest x-ray 07/09/2021 FINDINGS: Low inspiratory volumes with bibasilar patchy airspace opacities. Additionally, there is vague increased interstitial prominence throughout the right lung relative to the left. Stable cardiomegaly. No large effusion or pneumothorax. No acute osseous abnormality. IMPRESSION: 1. Vague increased interstitial prominence in the right lung relative to the left. Findings may reflect an underlying infectious/inflammatory process. 2. Low lung volumes with bibasilar airspace opacities favored to reflect atelectasis. Electronically Signed   By: Jacqulynn Cadet M.D.   On: 07/11/2021 08:51   DG ESOPHAGUS W SINGLE CM (SOL OR THIN BA)  Result Date: 07/12/2021 CLINICAL DATA:  Patient with history of dysphagia. States solid food is getting "stuck" proximally. Request is for esophagram single. EXAM: ESOPHAGUS/BARIUM SWALLOW/TABLET STUDY TECHNIQUE:  Single contrast examination was performed using thin liquid barium. This exam was performed by Rushie Nyhan NP, and was supervised and interpreted by Dr. Suzy Bouchard. FLUOROSCOPY: Radiation Exposure Index (as provided by the fluoroscopic device): 21.60 mGy Kerma COMPARISON:  None Available. FINDINGS: Swallowing: Appears normal. No vestibular penetration or aspiration seen. Pharynx: Unremarkable. Esophagus: Normal appearance. Esophageal motility: Mild esophageal dysmotility. Mild tertiary contractions in the distal esophagus Hiatal Hernia: Small hiatal hernia noted Gastroesophageal reflux: None visualized. Ingested 33m barium tablet: Passed  normally Other: None. IMPRESSION: 1.  Mild esophageal dysmotility. 2.  Small hiatal hernia Electronically Signed   By: SSuzy BouchardM.D.   On: 07/12/2021 09:41    Microbiology: Results for orders placed or performed during the hospital encounter of 07/09/21  Resp Panel by RT-PCR (Flu A&B, Covid) Nasopharyngeal Swab     Status: None   Collection Time: 07/09/21  7:57 PM   Specimen: Nasopharyngeal Swab; Nasopharyngeal(NP) swabs in vial transport medium  Result Value Ref Range Status   SARS Coronavirus 2 by RT PCR NEGATIVE NEGATIVE Final    Comment: (NOTE) SARS-CoV-2 target nucleic acids are NOT DETECTED.  The SARS-CoV-2 RNA is generally detectable in upper respiratory specimens during the acute phase of infection. The lowest concentration of SARS-CoV-2 viral copies this assay can detect is 138 copies/mL. A negative result does not preclude SARS-Cov-2 infection and should not be used as the sole basis for treatment or other patient management decisions. A negative result may occur with  improper specimen collection/handling, submission of specimen other than nasopharyngeal swab, presence of viral mutation(s) within the areas targeted by this assay, and inadequate number of viral copies(<138 copies/mL). A negative result must be combined with clinical observations, patient history, and epidemiological information. The expected result is Negative.  Fact Sheet for Patients:  hEntrepreneurPulse.com.au Fact Sheet for Healthcare Providers:  hIncredibleEmployment.be This test is no t yet approved or cleared by the [place] and  has been authorized for detection and/or diagnosis of SARS-CoV-2 by FDA under an Emergency Use Authorization (EUA). This EUA will remain  in effect (meaning this test can be used) for the duration of the COVID-19 declaration under Section 564(b)(1) of the Act, 21 U.S.C.section 360bbb-3(b)(1), unless the authorization is  terminated  or revoked sooner.       Influenza A by PCR NEGATIVE NEGATIVE Final   Influenza B by PCR NEGATIVE NEGATIVE Final    Comment: (NOTE) The Xpert Xpress SARS-CoV-2/FLU/RSV plus assay is intended as an aid in the diagnosis of influenza from Nasopharyngeal swab specimens and should not be used as a sole basis for treatment. Nasal washings and aspirates are unacceptable for Xpert Xpress SARS-CoV-2/FLU/RSV testing.  Fact Sheet for Patients: hEntrepreneurPulse.com.au Fact Sheet for Healthcare Providers: hIncredibleEmployment.be This test is not yet approved or cleared by the [place] and has been authorized for detection and/or diagnosis of SARS-CoV-2 by FDA under an Emergency Use Authorization (EUA). This EUA will remain in effect (meaning this test can be used) for the duration of the COVID-19 declaration under Section 564(b)(1) of the Act, 21 U.S.C. section 360bbb-3(b)(1), unless the authorization is terminated or revoked.  Performed at [facility] [address] [place]  [id number]  Culture, blood (routine x 2)     Status: None (Preliminary result)   Collection Time: 07/09/21 11:05 PM   Specimen: BLOOD  Result Value Ref Range Status   Specimen Description   Final    BLOOD Performed at [facility], [address], [place], [place] [address]    Special Requests   Final    NONE Performed at [facility], [address] [place], [place] [address]    Culture   Final    NO GROWTH 2 DAYS Performed at [facility], [place] [address], [place], [place] [address]    Report Status PENDING  Incomplete  Culture, blood (routine x 2)     Status: None (Preliminary result)   Collection Time: 07/09/21 11:09 PM   Specimen: BLOOD  Result Value Ref Range Status   Specimen Description   Final    BLOOD Performed at [facility], [address], [place], [place] [address]    Special Requests   Final    NONE Performed at [facility], [address] [place], [place] [address]    Culture   Final    NO GROWTH 2 DAYS Performed at [facility], [place] [address], [place], [place] [address]    Report Status PENDING  Incomplete  Respiratory (~20 pathogens) panel by PCR     Status: None   Collection Time: 07/10/21  8:43 AM   Specimen: Nasopharyngeal Swab; Respiratory  Result Value Ref Range Status   Adenovirus NOT DETECTED NOT DETECTED Final   Coronavirus 229E NOT DETECTED NOT DETECTED Final    Comment: (NOTE) The Coronavirus on the Respiratory Panel, DOES NOT test for the novel  Coronavirus (2019 nCoV)    Coronavirus HKU1 NOT DETECTED NOT DETECTED Final   Coronavirus NL63 NOT DETECTED NOT DETECTED Final   Coronavirus OC43 NOT DETECTED NOT DETECTED Final   Metapneumovirus NOT DETECTED NOT DETECTED Final   Rhinovirus / Enterovirus NOT DETECTED NOT DETECTED Final   Influenza A NOT DETECTED NOT DETECTED Final   Influenza B NOT DETECTED NOT DETECTED Final   Parainfluenza Virus 1 NOT DETECTED NOT DETECTED Final   Parainfluenza Virus 2 NOT DETECTED NOT DETECTED Final   Parainfluenza Virus 3 NOT DETECTED NOT DETECTED Final   Parainfluenza Virus 4 NOT DETECTED NOT DETECTED Final   Respiratory Syncytial Virus NOT DETECTED NOT DETECTED Final   Bordetella pertussis NOT DETECTED NOT DETECTED Final   Bordetella Parapertussis NOT DETECTED NOT DETECTED Final   Chlamydophila pneumoniae NOT DETECTED NOT DETECTED Final   Mycoplasma pneumoniae NOT DETECTED NOT DETECTED Final    Comment: Performed at [facility], [place]. [address] [place], [place] [address]  MRSA Next Gen by PCR, Nasal     Status: None   Collection Time: 07/10/21  4:37 PM   Specimen: Nasal Mucosa; Nasal Swab  Result Value Ref Range Status   MRSA by PCR Next Gen NOT DETECTED NOT DETECTED Final    Comment: (NOTE) The GeneXpert MRSA Assay (FDA  approved for NASAL specimens only), is one component of a comprehensive MRSA colonization surveillance program. It is not intended to diagnose MRSA infection nor to guide or monitor treatment for MRSA infections. Test performance is not FDA approved in patients less than 66 years old. Performed at [facility], [place] [address] [place],  [address]   Expectorated Sputum Assessment w Gram Stain, Rflx to Resp Cult     Status: None   Collection Time: 07/10/21  7:21 PM   Specimen: Sputum  Result Value Ref Range Status   Specimen Description SPUTUM  Final   Special Requests Normal  Final   Sputum evaluation   Final    THIS SPECIMEN IS ACCEPTABLE FOR  SPUTUM CULTURE Performed at [facility], [place] [address], [place], [place] [address]    Report Status 07/10/2021 FINAL  Final  Culture, Respiratory w Gram Stain     Status: None (Preliminary result)   Collection Time: 07/10/21  7:21 PM   Specimen: SPU  Result Value Ref Range Status   Specimen Description   Final    SPUTUM Performed at [facility] [address] [place], [place] [address]    Special Requests   Final    Normal Reflexed from [id number] Performed at [facility], [place] [address], [place], [place] [address]    Gram Stain   Final    RARE WBC PRESENT,BOTH PMN AND MONONUCLEAR RARE GRAM POSITIVE COCCI IN PAIRS RARE GRAM POSITIVE COCCI IN TETRADS    Culture   Final    MODERATE Normal respiratory flora-no Staph aureus or Pseudomonas seen Performed at [facility], [address], [place], [place] [address]    Report Status PENDING  Incomplete    Labs: CBC: Recent Labs  Lab 07/09/21 1853 07/09/21 2309 07/10/21 1211 07/11/21 0409 07/12/21 0416  WBC 18.5* 22.4* 26.3* 18.7* 14.1*  NEUTROABS  --  20.3*  --  14.4*  --   HGB 16.7 14.1 13.6 13.4 13.4  HCT 49.8 41.9 40.8 41.0 40.5  MCV 102.0* 101.7* 104.1* 104.6* 104.7*  PLT 204 203 177  177 297   Basic Metabolic Panel: Recent Labs  Lab 07/09/21 1853 07/10/21 1211 07/11/21 0623 07/12/21 0416  NA 139 142 140 141  K 4.7 4.2 3.6 3.7  CL 102 104 109 109  CO2 23 33* 26 24  GLUCOSE 104* 121* 110* 110*  BUN 26* 36* 30* 24*  CREATININE 1.69* 1.95* 1.63* 1.55*  CALCIUM 9.9 8.8* 8.6* 8.8*   Liver Function Tests: Recent Labs  Lab 07/09/21 1853  AST 27  ALT 19  ALKPHOS 47  BILITOT 0.8  PROT 6.8  ALBUMIN 4.3   CBG: No results for input(s): GLUCAP in the last 168 hours.  Discharge time spent: greater than 30 minutes.  Signed: Elmarie Shiley, MD [facility] 07/12/2021

## 2021-07-12 NOTE — TOC Progression Note (Signed)
Transition of Care (TOC) - Progression Note    Patient Details  Name: Jason Gates. MRN: [id number] Date of Birth: 07-16-1940  Transition of Care [facility]) CM/SW Contact  Purcell Mouton, RN Phone Number: 07/12/2021, 10:38 AM  Clinical Narrative:    Pt from home with Adult children. TOC will continue to follow for DC needs.    Expected Discharge Plan: [place] Barriers to Discharge: No Barriers Identified  Expected Discharge Plan and Services Expected Discharge Plan: [place] Choice: Durable Medical Equipment, Home Health Living arrangements for the past 2 months: Single Family Home                                       Social Determinants of Health (SDOH) Interventions    Readmission Risk Interventions     View : No data to display.

## 2021-07-13 LAB — CULTURE, RESPIRATORY W GRAM STAIN
Culture: NORMAL
Special Requests: NORMAL

## 2021-07-15 LAB — LEGIONELLA PNEUMOPHILA SEROGP 1 UR AG: L. pneumophila Serogp 1 Ur Ag: NEGATIVE

## 2021-07-15 LAB — CULTURE, BLOOD (ROUTINE X 2)
Culture: NO GROWTH
Culture: NO GROWTH

## 2021-07-26 ENCOUNTER — Other Ambulatory Visit: Payer: Self-pay | Admitting: Internal Medicine

## 2021-07-29 ENCOUNTER — Telehealth: Payer: Self-pay | Admitting: Internal Medicine

## 2021-07-29 NOTE — Telephone Encounter (Signed)
Patient had a death in the family and is not going to be able to make it to his 6/16 appt.  He would like to speak to the nurse about a medication the doctor had changed, and some blood work that was ordered.

## 2021-07-29 NOTE — Telephone Encounter (Signed)
I spoke with the patient. He was scheduled to see Dr. Saunders Revel on 08/09/21 for a follow up BMP & OV. Per the patient, he is going to head to [place] at that time due to the death of his oldest son.  He was calling to advise [place] that he had lab work on 07/24/21 with his PCP and wanted to know if that would suffice for what Dr. Saunders Revel needed.  Patient seen on 06/27/21 with Dr. Saunders Revel. Recommendations were to increase valsartan- HCTZ to 160/25 mg once daily.  07/24/21- BMP w/ his PCP in [place]. Sodium- 132 Potassium- 4.5 BUN- 32 Creatinine- 1.8  I advised the patient I would forward to Dr. Saunders Revel to review/ for further recommendations. He is aware we will call him back once MD recommendations received. He states he will call when he is back from [place] to r/s his follow up appointment.  The patient voices understanding and was appreciative of the call. Condolences offered.

## 2021-07-30 NOTE — Telephone Encounter (Signed)
I called and spoke with the patient.  Offered condolences on behalf of Dr. Saunders Revel. I have also advised him of Dr. Darnelle Bos recommendations as stated below. The patient voices understanding and is agreeable.  He was very appreciative of the call back.

## 2021-07-30 NOTE — Telephone Encounter (Signed)
I am sorry to hear about [name] son.  I have reviewed his labs from 07/24/21, which show slight rise in his creatinine from 1.6->1.8.  Creatinine is still within his baseline range in the setting of his CKD.  I recommend that he continue his current medications.  We do not need to repeat labs as previously scheduled on 6/16.  [name] can follow-up with [place] at his convenience when he returns from [place].  Nelva Bush, MD [facility]

## 2021-08-05 NOTE — Telephone Encounter (Signed)
Rx request sent to pharmacy.  

## 2021-08-08 ENCOUNTER — Telehealth: Payer: Self-pay | Admitting: Internal Medicine

## 2021-08-08 NOTE — Telephone Encounter (Signed)
I spoke with patient and he will take the antibiotic.

## 2021-08-08 NOTE — Telephone Encounter (Signed)
Pt c/o medication issue:  1. Name of Medication: Zithromax  2. How are you currently taking this medication (dosage and times per day)?Not taking  '250mg'$   3. Are you having a reaction (difficulty breathing--STAT)? No  4. What is your medication issue?  Medication indicates "if you have a heart condition he should not take it".  Was prescribed as after being in hospital. Patient wants to check with Dr. Saunders Revel if it is OK to take.

## 2021-08-08 NOTE — Telephone Encounter (Signed)
Patient has a possible pneumonia diagnosis and is concerned regarding medication exacerbating his atrial fibrillation.  Was advised by [facility] that they recommended treating. Upon reviewing notes, I would also recommend he take antibiotic however it is up to him.

## 2021-08-08 NOTE — Telephone Encounter (Signed)
I will defer to PharmD.

## 2021-08-09 ENCOUNTER — Ambulatory Visit: Payer: Medicare Other | Admitting: Physician Assistant

## 2021-08-13 ENCOUNTER — Telehealth: Payer: Self-pay | Admitting: Internal Medicine

## 2021-08-13 MED ORDER — VALSARTAN-HYDROCHLOROTHIAZIDE 160-25 MG PO TABS: 1.0000 | ORAL_TABLET | Freq: Every day | ORAL | 0 refills | Status: DC

## 2021-08-13 NOTE — Telephone Encounter (Signed)
Attempted to call pt back. No answer. Lmtcb.  

## 2021-08-13 NOTE — Telephone Encounter (Signed)
Pt returned a call from [place], [place].

## 2021-08-13 NOTE — Telephone Encounter (Signed)
Patient would like to speak to nurse about valsartan-hydrochlorothiazide (DIOVAN-HCT) 160-25 MG tablet.

## 2021-08-13 NOTE — Telephone Encounter (Signed)
Called and spoke with pt. Pt states that he needed to request a refill of his valsartan-hctz 160-25 mg.  Pt had to cancel his 1 month follow up d/t death in family.   Notified pt I have refilled medication per request to mail order pharmacy and rescheduled his follow up to this Friday 08/16/21 at 1:30 PM with Christell Faith, PA-C.   Pt appreciative and has no further questions.

## 2021-08-16 ENCOUNTER — Other Ambulatory Visit: Payer: Self-pay

## 2021-08-16 ENCOUNTER — Ambulatory Visit: Payer: Medicare Other | Admitting: Physician Assistant

## 2021-08-16 MED ORDER — APIXABAN 2.5 MG PO TABS: 2.5000 mg | ORAL_TABLET | Freq: Two times a day (BID) | ORAL | 1 refills | Status: DC

## 2021-08-19 ENCOUNTER — Encounter: Payer: Self-pay | Admitting: Physician Assistant

## 2021-10-13 ENCOUNTER — Emergency Department
Admission: EM | Admit: 2021-10-13 | Discharge: 2021-10-13 | Disposition: A | Discharge status: Home / Self Care | Payer: Medicare Other | Attending: Emergency Medicine | Admitting: Emergency Medicine

## 2021-10-13 ENCOUNTER — Encounter: Payer: Self-pay | Admitting: Emergency Medicine

## 2021-10-13 ENCOUNTER — Emergency Department: Payer: Medicare Other

## 2021-10-13 ENCOUNTER — Other Ambulatory Visit: Payer: Self-pay

## 2021-10-13 DIAGNOSIS — I2510 Atherosclerotic heart disease of native coronary artery without angina pectoris: Secondary | ICD-10-CM | POA: Diagnosis not present

## 2021-10-13 DIAGNOSIS — N189 Chronic kidney disease, unspecified: Secondary | ICD-10-CM | POA: Diagnosis not present

## 2021-10-13 DIAGNOSIS — Z7901 Long term (current) use of anticoagulants: Secondary | ICD-10-CM | POA: Diagnosis not present

## 2021-10-13 DIAGNOSIS — R42 Dizziness and giddiness: Secondary | ICD-10-CM | POA: Insufficient documentation

## 2021-10-13 DIAGNOSIS — Z8551 Personal history of malignant neoplasm of bladder: Secondary | ICD-10-CM | POA: Insufficient documentation

## 2021-10-13 DIAGNOSIS — R002 Palpitations: Secondary | ICD-10-CM

## 2021-10-13 DIAGNOSIS — R0789 Other chest pain: Secondary | ICD-10-CM | POA: Diagnosis present

## 2021-10-13 DIAGNOSIS — I129 Hypertensive chronic kidney disease with stage 1 through stage 4 chronic kidney disease, or unspecified chronic kidney disease: Secondary | ICD-10-CM | POA: Diagnosis not present

## 2021-10-13 LAB — BASIC METABOLIC PANEL
Anion gap: 7 (ref 5–15)
BUN: 25 mg/dL — ABNORMAL HIGH (ref 8–23)
CO2: 27 mmol/L (ref 22–32)
Calcium: 9.4 mg/dL (ref 8.9–10.3)
Chloride: 107 mmol/L (ref 98–111)
Creatinine, Ser: 1.7 mg/dL — ABNORMAL HIGH (ref 0.61–1.24)
GFR, Estimated: 40 mL/min — ABNORMAL LOW (ref >60)
Glucose, Bld: 114 mg/dL — ABNORMAL HIGH (ref 70–99)
Potassium: 3.7 mmol/L (ref 3.5–5.1)
Sodium: 141 mmol/L (ref 135–145)

## 2021-10-13 LAB — CBC WITH DIFFERENTIAL/PLATELET
Abs Immature Granulocytes: 0.02 10*3/uL (ref 0.00–0.07)
Basophils Absolute: 0.1 10*3/uL (ref 0.0–0.1)
Basophils Relative: 1 %
Eosinophils Absolute: 0.3 10*3/uL (ref 0.0–0.5)
Eosinophils Relative: 3 %
HCT: 47.4 % (ref 39.0–52.0)
Hemoglobin: 15.6 g/dL (ref 13.0–17.0)
Immature Granulocytes: 0 %
Lymphocytes Relative: 30 %
Lymphs Abs: 2.6 10*3/uL (ref 0.7–4.0)
MCH: 34 pg (ref 26.0–34.0)
MCHC: 32.9 g/dL (ref 30.0–36.0)
MCV: 103.3 fL — ABNORMAL HIGH (ref 80.0–100.0)
Monocytes Absolute: 0.6 10*3/uL (ref 0.1–1.0)
Monocytes Relative: 7 %
Neutro Abs: 5.1 10*3/uL (ref 1.7–7.7)
Neutrophils Relative %: 59 %
Platelets: 226 10*3/uL (ref 150–400)
RBC: 4.59 MIL/uL (ref 4.22–5.81)
RDW: 13.3 % (ref 11.5–15.5)
WBC: 8.6 10*3/uL (ref 4.0–10.5)
nRBC: 0 % (ref 0.0–0.2)

## 2021-10-13 LAB — TROPONIN I (HIGH SENSITIVITY)
Troponin I (High Sensitivity): 17 ng/L (ref <18)
Troponin I (High Sensitivity): 18 ng/L — ABNORMAL HIGH (ref <18)

## 2021-10-13 LAB — MAGNESIUM: Magnesium: 2.1 mg/dL (ref 1.7–2.4)

## 2021-10-13 NOTE — ED Provider Notes (Signed)
[facility] Provider Note    Event Date/Time   First MD Initiated Contact with Patient 10/13/21 [phone]     (approximate)   History   Chief Complaint Chest Pain   HPI  Jason Gates. is a 81 y.o. male with past medical history of hypertension, hyperlipidemia, CAD, atrial fibrillation on Eliquis, CKD, GERD, and bladder cancer who presents to the ED complaining of chest pain.  Patient reports that he has been "feeling off" with some dizziness and lightheadedness for the past 2 days.  He states he began to have some palpitations with sensation that his heart is racing since yesterday evening.  This has been associated with pressure in the center of his chest intermittently since yesterday, which he describes as a pressure that last for a few seconds at a time before resolving on its own.  He denies any fevers or cough and has not had any difficulty breathing.  He has not noticed any pain or swelling in his legs.  He was additionally concerned that his blood pressure seem to be high this morning, despite taking his blood pressure medications as prescribed.     Physical Exam   Triage Vital Signs: ED Triage Vitals  Enc Vitals Group     BP 10/13/21 0759 (!) 188/96     Pulse Rate 10/13/21 0759 71     Resp 10/13/21 0759 (!) 24     Temp 10/13/21 0759 97.6 F (36.4 C)     Temp Source 10/13/21 0759 Oral     SpO2 10/13/21 0759 97 %     Weight 10/13/21 0757 225 lb (102.1 kg)     Height 10/13/21 0757 '5\' 8"'$  (1.727 m)     Head Circumference --      Peak Flow --      Pain Score 10/13/21 0756 3     Pain Loc --      Pain Edu? --      Excl. in [place]? --     Most recent vital signs: Vitals:   10/13/21 1000 10/13/21 1030  BP: (!) 143/76 (!) 142/69  Pulse: (!) 45 (!) 47  Resp: 17 16  Temp:    SpO2: 97% 95%    Constitutional: Alert and oriented. Eyes: Conjunctivae are normal. Head: Atraumatic. Nose: No congestion/rhinnorhea. Mouth/Throat: Mucous membranes are  moist.  Cardiovascular: Normal rate, regular rhythm. Grossly normal heart sounds.  2+ radial pulses bilaterally. Respiratory: Normal respiratory effort.  No retractions. Lungs CTAB.  No chest wall tenderness to palpation noted. Gastrointestinal: Soft and nontender. No distention. Musculoskeletal: No lower extremity tenderness nor edema.  Neurologic:  Normal speech and language. No gross focal neurologic deficits are appreciated.    ED Results / Procedures / Treatments   Labs (all labs ordered are listed, but only abnormal results are displayed) Labs Reviewed  CBC WITH DIFFERENTIAL/PLATELET - Abnormal; Notable for the following components:      Result Value   MCV 103.3 (*)    All other components within normal limits  BASIC METABOLIC PANEL - Abnormal; Notable for the following components:   Glucose, Bld 114 (*)    BUN 25 (*)    Creatinine, Ser 1.70 (*)    GFR, Estimated 40 (*)    All other components within normal limits  TROPONIN I (HIGH SENSITIVITY) - Abnormal; Notable for the following components:   Troponin I (High Sensitivity) 18 (*)    All other components within normal limits  MAGNESIUM  TROPONIN I (  HIGH SENSITIVITY)     EKG  ED ECG REPORT I, Blake Divine, the attending physician, personally viewed and interpreted this ECG.   Date: 10/13/2021  EKG Time: 7:55  Rate: 86  Rhythm: normal sinus rhythm, PACs  Axis: Normal  Intervals:none  ST&T Change: None  ED ECG REPORT I, Blake Divine, the attending physician, personally viewed and interpreted this ECG.   Date: 10/13/2021  EKG Time: 9:08  Rate: 67  Rhythm: normal sinus rhythm, PVC  Axis: Normal  Intervals:none  ST&T Change: None  RADIOLOGY Chest x-ray reviewed and interpreted by me with no infiltrate, edema, or effusion.  PROCEDURES:  Critical Care performed: No  .1-3 Lead EKG Interpretation  Performed by: Blake Divine, MD Authorized by: Blake Divine, MD     Interpretation: abnormal      ECG rate:  65-80   ECG rate assessment: normal     Rhythm: sinus rhythm     Ectopy: PAC and PVCs     Conduction: normal      MEDICATIONS ORDERED IN ED: Medications - No data to display   IMPRESSION / MDM / [place] / ED COURSE  I reviewed the triage vital signs and the nursing notes.                              81 y.o. male with past medical history of hypertension, hyperlipidemia, CAD, atrial fibrillation on Eliquis, CKD, GERD, and bladder cancer who presents to the ED with 2 days of lightheadedness and dizziness followed by palpitations and intermittent chest pressure since yesterday.  Patient's presentation is most consistent with acute presentation with potential threat to life or bodily function.  Differential diagnosis includes, but is not limited to, ACS, arrhythmia, PE, pneumonia, pneumothorax, electrolyte abnormality, AKI, anemia.  Patient nontoxic-appearing and in no acute distress, vital signs are remarkable for elevated blood pressure, however this is improving on my assessment without intervention.  EKG shows normal sinus rhythm with PACs, no ischemic changes noted.  We will continue to observe on cardiac monitor and screen troponin, but symptoms seem atypical for ACS.  Low suspicion for PE at this time as he currently denies any chest pain or shortness of breath.  Plan to check additional labs for electrolyte abnormality or anemia, also screen chest x-ray.  The patient is on the cardiac monitor to evaluate for evidence of arrhythmia and/or significant heart rate changes.  Labs are reassuring with stable chronic kidney disease, no significant electrolyte abnormality, leukocytosis, or anemia noted.  2 sets of troponin are stable and magnesium within normal limits.  Patient with occasional PACs and PVCs on monitor but no significant arrhythmia noted.  On reassessment, patient reports he feels better with no chest pain currently.  He is appropriate for discharge home,  states he has an appointment with his cardiologist tomorrow.  He was counseled to return to the ED for new or worsening symptoms, patient agrees with plan.      FINAL CLINICAL IMPRESSION(S) / ED DIAGNOSES   Final diagnoses:  Atypical chest pain  Palpitations     Rx / DC Orders   ED Discharge Orders     None        Note:  This document was prepared using Dragon voice recognition software and may include unintentional dictation errors.   Blake Divine, MD 10/13/21 1125

## 2021-10-13 NOTE — ED Triage Notes (Signed)
Pt here for central and left sided chest pain for 2 days described as tight. Non radiating, denies SHOB.  Unlabored on arrival.  Hx of stent X 2 from routine cath per pt. Pt was taken straight to room and EKG performed.

## 2021-10-14 ENCOUNTER — Ambulatory Visit (INDEPENDENT_AMBULATORY_CARE_PROVIDER_SITE_OTHER): Payer: Medicare Other | Admitting: Cardiology

## 2021-10-14 ENCOUNTER — Encounter: Payer: Self-pay | Admitting: Cardiology

## 2021-10-14 ENCOUNTER — Ambulatory Visit: Payer: Medicare Other | Admitting: Medical

## 2021-10-14 VITALS — BP 140/70 | HR 73 | Ht 68.0 in | Wt 231.8 lb

## 2021-10-14 DIAGNOSIS — R072 Precordial pain: Secondary | ICD-10-CM

## 2021-10-14 DIAGNOSIS — I480 Paroxysmal atrial fibrillation: Secondary | ICD-10-CM | POA: Diagnosis not present

## 2021-10-14 DIAGNOSIS — I10 Essential (primary) hypertension: Secondary | ICD-10-CM

## 2021-10-14 DIAGNOSIS — E785 Hyperlipidemia, unspecified: Secondary | ICD-10-CM | POA: Diagnosis not present

## 2021-10-14 DIAGNOSIS — I2510 Atherosclerotic heart disease of native coronary artery without angina pectoris: Secondary | ICD-10-CM | POA: Diagnosis not present

## 2021-10-14 MED ORDER — METOPROLOL SUCCINATE ER 25 MG PO TB24: 12.5000 mg | ORAL_TABLET | Freq: Every day | ORAL | 11 refills | Status: DC

## 2021-10-14 NOTE — Progress Notes (Signed)
Cardiology Clinic Note   Patient Name: Jason Gates. Date of Encounter: 10/14/2021  Primary Care Provider:  Wayland Denis, PA-C Primary Cardiologist:  Nelva Bush, MD  Patient Profile    81 year old male with a past medical history of coronary artery disease s/p remote PCI's to the LAD and diagonal branch that were complicated by subacute stent thrombosis, paroxysmal atrial fibrillation, PSVT, hypertension, hyperlipidemia, chronic kidney disease, obstructive sleep apnea on CPAP, stroke, bladder cancer, and GERD, who presents today for follow on CAD and PAF.   Past Medical History    Past Medical History:  Diagnosis Date   Atrial fibrillation ([place])    Bladder cancer ([place])    Chronic kidney disease (CKD)    Coronary artery disease    Past Surgical History:  Procedure Laterality Date   CARDIAC SURGERY     CHOLECYSTECTOMY     CORONARY ANGIOPLASTY     "two stents in 2007"   EYE SURGERY     NASAL SEPTUM SURGERY      Allergies  Allergies  Allergen Reactions   Elemental Sulfur Other (See Comments)    intolerance   Phosphorus Other (See Comments)    intolerance   Amoxicillin Rash    History of Present Illness    81 year old male with a previous mentioned past medical history of coronary disease status post remote PCI's to the LAD and diagonal branch that complicated by subacute stent thrombosis, paroxysmal atrial fibrillation, PSVT, hypertension, hyperlipidemia, chronic kidney disease, obstructive sleep apnea on CPAP, stroke, bladder cancer, and gastroesophageal reflux disease.  Last seen in clinic by Dr End on 06/27/21.  At that time he stated that he was doing well.  He was continued on apixaban 2.5 mg twice daily and valsartan HCTZ 160/25 mg once daily.  He was evaluated in the emergency department on 10/21/2021 where he had been feeling some dizziness and lightheadedness for the prior 2 days.  He states he began to have palpitations with a sensation that his  heart was racing since yesterday evening.  He also had associated chest pressure in the center of his chest intermittently since the day before which she is described as a pressure that lasted for few seconds at a time resolving on its own.  He had denied any fevers or cough and shortness of breath.  He was also additionally concerned as his blood pressure was found to be 188/96, pulse of 71, respirations of 24, temperature 97.6.  Pertinent labs were blood glucose of 114, BUN 25 creatinine of 1.70, GFR 40, high-sensitivity troponin was 17 and repeat was 18, EKG revealed sinus rhythm with a rate of 86 with PACs.  He returns to clinic today following up after his recent visit to the emergency department for elevated blood pressure and intermittent chest discomfort.  He states that the palpitations have improved but he has been having episodes of the more morning.  He was concerned about the elevated high-sensitivity troponins as he and his son have been looking those up on the Internet.  The last time that he had undergone cardiac catheterization was in 01/2013 and recently stated that he had undergone a chemical stress test but it was several years prior.  Today he is chest pain-free denies any worsening shortness of breath, continues to endorse having palpitations and peripheral edema.  Home Medications    Current Outpatient Medications  Medication Sig Dispense Refill   metoprolol succinate (TOPROL XL) 25 MG 24 hr tablet Take 0.5 tablets (  12.5 mg total) by mouth daily. 15 tablet 11   amLODipine (NORVASC) 5 MG tablet Take 5 mg by mouth at bedtime.     apixaban (ELIQUIS) 2.5 MG TABS tablet Take 1 tablet (2.5 mg total) by mouth 2 (two) times daily. 180 tablet 1   aspirin 81 MG tablet Take 81 mg by mouth at bedtime.     carboxymethylcellulose (REFRESH PLUS) 0.5 % SOLN Place 1 drop into the right eye daily as needed (dry eyes).     Cholecalciferol (VITAMIN D) 125 MCG (5000 UT) CAPS Take 5,000 Units by mouth  daily.     fluticasone (FLONASE) 50 MCG/ACT nasal spray Place 1 spray into both nostrils daily.     OVER THE COUNTER MEDICATION Take 1 tablet by mouth daily. Bladder 2.2     prednisoLONE acetate (PRED FORTE) 1 % ophthalmic suspension Place 1 drop into the left eye daily.     RABEprazole (ACIPHEX) 20 MG tablet Take 1 tablet (20 mg total) by mouth daily. (Patient taking differently: Take 20 mg by mouth at bedtime.) 90 tablet 3   simvastatin (ZOCOR) 20 MG tablet Take 20 mg by mouth daily.     valACYclovir (VALTREX) 500 MG tablet Take 500 mg by mouth at bedtime.     valsartan-hydrochlorothiazide (DIOVAN-HCT) 160-25 MG tablet Take 1 tablet by mouth daily. 90 tablet 0   vitamin B-12 100 MCG tablet Take 1 tablet (100 mcg total) by mouth daily. 30 tablet 0   No current facility-administered medications for this visit.     Family History    Family History  Problem Relation Age of Onset   Obesity Mother    Diabetes Father    Heart attack Father    Heart disease Father    He indicated that his mother is deceased. He indicated that his father is deceased.  Social History    Social History   Socioeconomic History   Marital status: Widowed    Spouse name: Not on file   Number of children: Not on file   Years of education: Not on file   Highest education level: Not on file  Occupational History   Not on file  Tobacco Use   Smoking status: Former    Packs/day: 1.00    Years: 15.00    Total pack years: 15.00    Types: Cigarettes    Quit date: 53    Years since quitting: 47.6   Smokeless tobacco: Not on file   Tobacco comments:    Quit smoking in 1976  Vaping Use   Vaping Use: Never used  Substance and Sexual Activity   Alcohol use: Not Currently    Comment: quit in 1976   Drug use: Never   Sexual activity: Not on file  Other Topics Concern   Not on file  Social History Narrative   Not on file   Social Determinants of Health   Financial Resource Strain: Not on file  Food  Insecurity: Not on file  Transportation Needs: Not on file  Physical Activity: Not on file  Stress: Not on file  Social Connections: Not on file  Intimate Partner Violence: Not on file     Review of Systems    General:  No chills, fever, night sweats or endorses weight changes and fatigue. Cardiovascular: Endorses intermittent chest pain, dyspnea on exertion, edema, and denies orthopnea, palpitations, paroxysmal nocturnal dyspnea. Dermatological: No rash, lesions/masses Respiratory: No cough, endorses dyspnea Urologic: No hematuria, dysuria Abdominal:   No nausea, vomiting,  diarrhea, bright red blood per rectum, melena, or hematemesis Neurologic:  No visual changes, wkns, changes in mental status. All other systems reviewed and are otherwise negative except as noted above.   Physical Exam    VS:  BP (!) 140/70 (BP Location: Left Arm, Patient Position: Sitting, Cuff Size: Normal)   Pulse 73   Ht '5\' 8"'$  (1.727 m)   Wt 231 lb 12.8 oz (105.1 kg)   SpO2 93%   BMI 35.25 kg/m  , BMI Body mass index is 35.25 kg/m.     GEN: Well nourished, well developed, in no acute distress. HEENT: normal. Neck: Supple, no JVD, carotid bruits, or masses. Cardiac: RRR, no murmurs, rubs, or gallops. No clubbing, cyanosis, 1+ edema to the bilateral lower extremities.  Radials/DP/PT 2+ and equal bilaterally.  Respiratory:  Respirations regular and unlabored, clear to auscultation bilaterally. GI: Soft, nontender, nondistended, BS + x 4. MS: no deformity or atrophy. Skin: warm and dry, no rash. Neuro:  Strength and sensation are intact. Psych: Normal affect.  Accessory Clinical Findings    ECG personally reviewed by me today-sinus rhythm with sinus arrhythmia with a rate of 73 with LVH and PACs- No acute changes  Lab Results  Component Value Date   WBC 8.6 10/13/2021   HGB 15.6 10/13/2021   HCT 47.4 10/13/2021   MCV 103.3 (H) 10/13/2021   PLT 226 10/13/2021   Lab Results  Component Value Date    CREATININE 1.70 (H) 10/13/2021   BUN 25 (H) 10/13/2021   NA 141 10/13/2021   K 3.7 10/13/2021   CL 107 10/13/2021   CO2 27 10/13/2021   Lab Results  Component Value Date   ALT 19 07/09/2021   AST 27 07/09/2021   ALKPHOS 47 07/09/2021   BILITOT 0.8 07/09/2021   Lab Results  Component Value Date   CHOL 137 01/31/2021   HDL 35 (L) 01/31/2021   LDLCALC 68 01/31/2021   TRIG 204 (H) 01/31/2021   CHOLHDL 3.9 01/31/2021    No results found for: "HGBA1C"  Assessment & Plan   1.  Coronary artery disease with recent emergency department visit with intermittent chest discomfort located midsternal without radiation, without aggravating or alleviating factors, and occasional shortness of breath.  High-sensitivity troponins were trended in the emergency department and 17, 17, and 18.  His last heart catheterization was in 2014 and his last chemical stress test was prior to moving from [place], [place] to [place].  With the episode of chest discomfort that he had overall been feeling well for 3 to 4 days in a row and elevated high-sensitivity troponins in the emergency department he has been scheduled for [facility].  EKG today was completed without any ischemic concerns noted.  2.  Paroxysmal atrial fibrillation who is in sinus rhythm today with a rate of 73 with PACs and sinus arrhythmia.  He is continued on Eliquis 2.5 mg twice daily, with reduced dosing , based on age and creatinine.  With increased amount of palpitations that he has been having PACs and PVCs noted on EKG between the office in the emergency department he will also be started on Toprol-XL 12.5 mg daily.  30-day supply has been sent into the pharmacy of choice if he tolerates the medication will need a 90-day prescription sent to his mail order pharmacy.  3.  Hyperlipidemia with an LDL of 71 on 08/30/2021.  He is continued on simvastatin 20 mg daily.  This has been followed by his PCP.  4.  Hypertension with his blood  pressure today noted 140/70.  He was greater than 007 systolic when he was in the emergency department.  He has been continued on his reduced valsartan HCTZ 160/25 mg daily amlodipine 5 mg daily and started Toprol-XL 12.5 mg daily.  He is to continue taking his blood pressures at home and keep a blood pressure log to bring to his next appointment.  5.  Chronic kidney disease with a baseline serum creatinine of 1.6-1.8.  Last serum creatinine of 1.70 done 10/13/2021. Followed by PCP.  6.  Disposition patient to follow-up in clinic after Lexiscan is completed with Dr. Saunders Revel if possible  Jorden Mahl, NP 10/14/2021, 3:42 PM

## 2021-10-14 NOTE — Patient Instructions (Addendum)
Medication Instructions:  Your physician has recommended you make the following change in your medication:  1) START taking Toprol XL (metoprolol succinate) 12.5 mg daily *If you need a refill on your cardiac medications before your next appointment, please call your pharmacy*  Testing/Procedures: Your physician has requested that you have a lexiscan myoview. For further information please visit [email or web address]. Please follow instruction sheet, as given.  Follow-Up: At [facility], you and your health needs are our priority.  As part of our continuing mission to provide you with exceptional heart care, we have created designated Provider Care Teams.  These Care Teams include your primary Cardiologist (physician) and Advanced Practice Providers (APPs -  Physician Assistants and Nurse Practitioners) who all work together to provide you with the care you need, when you need it.   Your next appointment:   After stress test  The format for your next appointment:   In Person  Provider:   Nelva Bush, MD    Other Instructions [place]  Your caregiver has ordered a Stress Test with nuclear imaging. The purpose of this test is to evaluate the blood supply to your heart muscle. This procedure is referred to as a "Non-Invasive Stress Test." This is because other than having an IV started in your vein, nothing is inserted or "invades" your body. Cardiac stress tests are done to find areas of poor blood flow to the heart by determining the extent of coronary artery disease (CAD). Some patients exercise on a treadmill, which naturally increases the blood flow to your heart, while others who are  unable to walk on a treadmill due to physical limitations have a pharmacologic/chemical stress agent called Lexiscan . This medicine will mimic walking on a treadmill by temporarily increasing your coronary blood flow.   Please note: these test may take anywhere between 2-4 hours to  complete  PLEASE REPORT TO [place] AT THE FIRST DESK WILL DIRECT YOU WHERE TO GO  Date of Procedure:_____________________________________  Arrival Time for Procedure:______________________________  PLEASE NOTIFY THE OFFICE AT LEAST 24 HOURS IN ADVANCE IF YOU ARE UNABLE TO KEEP YOUR APPOINTMENT.  [phone] AND  PLEASE NOTIFY NUCLEAR MEDICINE AT [facility] AT LEAST 24 HOURS IN ADVANCE IF YOU ARE UNABLE TO KEEP YOUR APPOINTMENT. [phone]  How to prepare for your Myoview test:  Do not eat or drink after midnight No caffeine for 24 hours prior to test No smoking 24 hours prior to test. Your medication may be taken with water.  If your doctor stopped a medication because of this test, do not take that medication. Ladies, please do not wear dresses.  Skirts or pants are appropriate. Please wear a short sleeve shirt. No perfume, cologne or lotion. Wear comfortable walking shoes. No heels!  Important Information About Sugar

## 2021-10-15 ENCOUNTER — Telehealth: Payer: Self-pay | Admitting: Internal Medicine

## 2021-10-15 NOTE — Telephone Encounter (Signed)
   Pre-operative Risk Assessment    Patient Name: Tyleek Smick.  DOB: [birth year] MRN: [id number]      Request for Surgical Clearance    Procedure:   Colonoscopy/EGD at [facility]  Date of Surgery:  Clearance 01/24/22                                 Surgeon:  not indicated  Surgeon's Group or Practice Name:  [facility] Phone number:  [phone] Fax number:  [phone]   Type of Clearance Requested:   - Pharmacy:  Hold Rivaroxaban (Xarelto) instructions    Type of Anesthesia:  Not Indicated   Additional requests/questions:    Manfred Arch   10/15/2021, 3:13 PM

## 2021-10-16 ENCOUNTER — Telehealth: Payer: Self-pay | Admitting: Cardiology

## 2021-10-16 NOTE — Telephone Encounter (Signed)
Pt c/o medication issue:  1. Name of Medication: metoprolol succinate (TOPROL XL) 25 MG 24 hr tablet  2. How are you currently taking this medication (dosage and times per day)?   3. Are you having a reaction (difficulty breathing--STAT)?   4. What is your medication issue? Patient is called stating Jason Gates told him to take a pill of this medication.  However, the instructions on the medication label it states to take 1/2 tablet.  He wants to clarify what he should be taking. Please advise.

## 2021-10-16 NOTE — Telephone Encounter (Signed)
Patient with diagnosis of afib on Eliquis, not Xarelto as clearance request indicates, for anticoagulation.    Procedure: colonoscopy/EGD Date of procedure: 01/24/22  CHA2DS2-VASc Score = 6  This indicates a 9.7% annual risk of stroke. The patient's score is based upon: CHF History: 0 HTN History: 1 Diabetes History: 0 Stroke History: 2 Vascular Disease History: 1 Age Score: 2 Gender Score: 0   CrCl 10m/min Platelet count 226K  Per office protocol, patient can hold Eliquis for 1 day prior to procedure. He should resume as soon as safely possible after given his elevated CV risk.  **This guidance is not considered finalized until pre-operative APP has relayed final recommendations.**

## 2021-10-16 NOTE — Telephone Encounter (Signed)
Per Sherri Hammock's, NP note 10/14/21 pt to start metoprolol succinate 12.5 mg daily.  Clarified with pt to take 1/2 tablet daily for 12.5 mg daily.  Pt voiced understanding and has no further questions at this time.

## 2021-10-24 ENCOUNTER — Other Ambulatory Visit: Payer: Self-pay | Admitting: Internal Medicine

## 2021-10-29 ENCOUNTER — Encounter
Admission: RE | Admit: 2021-10-29 | Discharge: 2021-10-29 | Disposition: A | Discharge status: Home / Self Care | Payer: Medicare Other | Source: Ambulatory Visit | Attending: Cardiology | Admitting: Cardiology

## 2021-10-29 DIAGNOSIS — R072 Precordial pain: Secondary | ICD-10-CM | POA: Diagnosis present

## 2021-10-29 MED ORDER — TECHNETIUM TC 99M TETROFOSMIN IV KIT
10.0800 | PACK | Freq: Once | INTRAVENOUS | Status: AC | PRN
  Administered 2021-10-29: 10.08 via INTRAVENOUS

## 2021-10-29 MED ORDER — TECHNETIUM TC 99M TETROFOSMIN IV KIT
30.0000 | PACK | Freq: Once | INTRAVENOUS | Status: AC
  Administered 2021-10-29: 31.55 via INTRAVENOUS

## 2021-10-29 MED ORDER — REGADENOSON 0.4 MG/5ML IV SOLN
0.4000 mg | Freq: Once | INTRAVENOUS | Status: AC
Start: 2021-10-29 — End: 2021-10-29
  Administered 2021-10-29: 0.4 mg via INTRAVENOUS

## 2021-10-30 LAB — NM MYOCAR MULTI W/SPECT W/WALL MOTION / EF
Base ST Depression (mm): 0 mm
LV dias vol: 87 mL (ref 62–150)
LV sys vol: 33 mL
Nuc Stress EF: 62 %
Peak HR: 94 {beats}/min
Percent HR: 68 %
Rest HR: 63 {beats}/min
Rest Nuclear Isotope Dose: 10.1 mCi
SDS: 1
SRS: 1
SSS: 1
ST Depression (mm): 0 mm
Stress Nuclear Isotope Dose: 31.6 mCi
TID: 0.89

## 2021-10-30 NOTE — Telephone Encounter (Signed)
Patient has an upcoming appointment with Melinda Crutch, NP on 11/08/21 for follow-up of nuclear stress test results. I have noted request for clearance in the appointment notes.

## 2021-10-31 ENCOUNTER — Telehealth: Payer: Self-pay | Admitting: *Deleted

## 2021-10-31 NOTE — Telephone Encounter (Signed)
-----   Message from Kavin Leech, RN sent at 10/30/2021  3:34 PM EDT -----  ----- Message ----- From: Gerrie Nordmann, NP Sent: 10/30/2021   3:26 PM EDT To: Rebeca Alert [facility] Triage  Please let Mr. Regnier know that his test did not show any new changes. Which is reassuring. His test is considered to be a low risk scan. Thanks.

## 2021-10-31 NOTE — Telephone Encounter (Signed)
Patient is returning call.  °

## 2021-10-31 NOTE — Telephone Encounter (Signed)
Left voicemail message to call back for review of results and recommendations.  

## 2021-10-31 NOTE — Telephone Encounter (Signed)
Reviewed results with patient. He verbalized understanding confirmed upcoming appointment with no further questions at this time.

## 2021-11-06 NOTE — Progress Notes (Signed)
Cardiology Clinic Note   Patient Name: Jason Gates. Date of Encounter: 11/08/2021  Primary Care Provider:  Wayland Denis, PA-C Primary Cardiologist:  Nelva Bush, MD  Patient Profile     81 year old male with a past medical history of coronary artery disease status post remote PCI to the LAD and diagonal branch that were complicated by subacute stent thrombosis, paroxysmal atrial fibrillation, PSVT, hypertension, hyperlipidemia, chronic kidney disease, obstructive sleep apnea on CPAP, stroke, bladder cancer, and GERD, who presents today for follow-up on his CAD and PAF.  Past Medical History    Past Medical History:  Diagnosis Date   Atrial fibrillation ([place])    Bladder cancer ([place])    Chronic kidney disease (CKD)    Coronary artery disease    Past Surgical History:  Procedure Laterality Date   CARDIAC SURGERY     CHOLECYSTECTOMY     CORONARY ANGIOPLASTY     "two stents in 2007"   EYE SURGERY     NASAL SEPTUM SURGERY      Allergies  Allergies  Allergen Reactions   Elemental Sulfur Other (See Comments)    intolerance   Phosphorus Other (See Comments)    intolerance   Amoxicillin Rash    History of Present Illness    81 year old male with a previous mentioned past medical history of coronary disease status post remote PCI's to the LAD and diagonal branch complicated by subacute stent thrombosis, paroxysmal atrial fibrillation, PSVT, hypertension, hyperlipidemia, chronic kidney disease, obstructive sleep apnea on CPAP, stroke, bladder cancer, gastroesophageal reflux disease.  He was evaluated in the emergency department 10/21/2021 where he had been feeling some dizziness lightheadedness for the prior 2 days.  He states he began and palpitations with a sensation of his heart was racing since yesterday evening.  He had also associated chest pressure centimeters chest intermittently since the day before which he described as a pressure that lasted for few  seconds at a time resolving on its own.  He had denied any fevers or cough or shortness of breath.  He was also additionally concerned as his blood pressure was found to be 188/96, pulse of 71, respirations of 24, temperature of 97.6.  Pertinent labs at that time a blood glucose of 114, BUN 25, creatinine of 1.70, GFR 40, high-sensitivity troponin was 17 and repeat was 18, EKG revealed sinus rhythm with a rate of 86 with PACs.  He was last seen in the office on 10/14/2021 to follow-up on that emergency department visit for his intermittent chest discomfort and elevated blood pressure.  At that time with his high-sensitivity troponin was trended in the emergency department 17, 17, and 18 with his last heart catheterization being in 2014 he was scheduled for a Myoview Lexiscan.  His study was low risk, probably normal pharmacologic myocardial perfusion stress test.  There was a small in size moderate in severity fixed apical and apical lateral defect most consistent with artifact but cannot rule out scar.  There was no significant ischemia that was identified, left ventricular systolic function was normal by visual estimation of 65%, coronary artery calcification and/or stent were noted as well as aortic atherosclerosis.  He returns to clinic today to follow-up on the changes that were made to his medications for his high blood pressure, to go over his outpatient Myoview results, and will also need clearance for upcoming colonoscopy.  He states that he has been doing fairly well without any other complaints.  He did have  an intolerance to Toprol-XL as it made him feel but overall sluggish.  Does not take medication for approximately 5 days he stated that he then stopped the medication at that time is to continue to have same symptoms several days after following the symptoms have resolved since has been off of the medication.  But denies any chest pain, shortness of breath, palpitations, and endorses some edema to  his bilateral lower extremities as well as weight gain.  Home Medications    Current Outpatient Medications  Medication Sig Dispense Refill   amLODipine (NORVASC) 5 MG tablet Take 5 mg by mouth at bedtime.     apixaban (ELIQUIS) 2.5 MG TABS tablet Take 1 tablet (2.5 mg total) by mouth 2 (two) times daily. 180 tablet 1   aspirin 81 MG tablet Take 81 mg by mouth at bedtime.     carboxymethylcellulose (REFRESH PLUS) 0.5 % SOLN Place 1 drop into the right eye daily as needed (dry eyes).     Cholecalciferol (VITAMIN D) 125 MCG (5000 UT) CAPS Take 5,000 Units by mouth daily.     DIOVAN HCT 160-25 MG tablet TAKE 1 TABLET DAILY 90 tablet 0   fluticasone (FLONASE) 50 MCG/ACT nasal spray Place 1 spray into both nostrils daily.     OVER THE COUNTER MEDICATION Take 1 tablet by mouth daily. Bladder 2.2     prednisoLONE acetate (PRED FORTE) 1 % ophthalmic suspension Place 1 drop into the left eye daily.     RABEprazole (ACIPHEX) 20 MG tablet Take 1 tablet (20 mg total) by mouth daily. (Patient taking differently: Take 20 mg by mouth at bedtime.) 90 tablet 3   simvastatin (ZOCOR) 20 MG tablet Take 20 mg by mouth daily.     valACYclovir (VALTREX) 500 MG tablet Take 500 mg by mouth at bedtime.     vitamin B-12 100 MCG tablet Take 1 tablet (100 mcg total) by mouth daily. 30 tablet 0   No current facility-administered medications for this visit.     Family History    Family History  Problem Relation Age of Onset   Obesity Mother    Diabetes Father    Heart attack Father    Heart disease Father    He indicated that his mother is deceased. He indicated that his father is deceased.  Social History    Social History   Socioeconomic History   Marital status: Widowed    Spouse name: Not on file   Number of children: Not on file   Years of education: Not on file   Highest education level: Not on file  Occupational History   Not on file  Tobacco Use   Smoking status: Former    Packs/day: 1.00     Years: 15.00    Total pack years: 15.00    Types: Cigarettes    Quit date: 58    Years since quitting: 47.7   Smokeless tobacco: Not on file   Tobacco comments:    Quit smoking in 1976  Vaping Use   Vaping Use: Never used  Substance and Sexual Activity   Alcohol use: Not Currently    Comment: quit in 1976   Drug use: Never   Sexual activity: Not on file  Other Topics Concern   Not on file  Social History Narrative   Not on file   Social Determinants of Health   Financial Resource Strain: Not on file  Food Insecurity: Not on file  Transportation Needs: Not on file  Physical Activity: Not on file  Stress: Not on file  Social Connections: Not on file  Intimate Partner Violence: Not on file     Review of Systems    General:  No chills, fever, night sweats or endorses weight gain.  Cardiovascular:  No chest pain, dyspnea on exertion, endorses peripheral edema, orthopnea, palpitations, paroxysmal nocturnal dyspnea. Dermatological: No rash, lesions/masses Respiratory: No cough, dyspnea Urologic: No hematuria, dysuria Abdominal:   No nausea, vomiting, diarrhea, bright red blood per rectum, melena, or hematemesis Neurologic:  No visual changes, wkns, changes in mental status. All other systems reviewed and are otherwise negative except as noted above.   Physical Exam    VS:  BP 130/70 (BP Location: Left Arm, Patient Position: Sitting, Cuff Size: Normal)   Pulse 73   Ht '5\' 8"'$  (1.727 m)   Wt 236 lb 4 oz (107.2 kg)   SpO2 96%   BMI 35.92 kg/m  , BMI Body mass index is 35.92 kg/m.     GEN: Well nourished, well developed, in no acute distress. HEENT: normal. Neck: Supple, no JVD, carotid bruits, or masses. Cardiac: RRR, no murmurs, rubs, or gallops. No clubbing, cyanosis,1+ edema to BLE.  Radials/DP/PT 2+ and equal bilaterally.  Respiratory:  Respirations regular and unlabored, clear to auscultation bilaterally. GI: Soft, nontender, nondistended, BS + x 4. MS: no  deformity or atrophy. Skin: warm and dry, no rash. Neuro:  Strength and sensation are intact. Psych: Normal affect.  Accessory Clinical Findings    ECG personally reviewed by me today-sinus rhythm with rate of 73 with sinus arrhythmia and PACs as well as LVH- No acute changes  Lab Results  Component Value Date   WBC 8.6 10/13/2021   HGB 15.6 10/13/2021   HCT 47.4 10/13/2021   MCV 103.3 (H) 10/13/2021   PLT 226 10/13/2021   Lab Results  Component Value Date   CREATININE 1.70 (H) 10/13/2021   BUN 25 (H) 10/13/2021   NA 141 10/13/2021   K 3.7 10/13/2021   CL 107 10/13/2021   CO2 27 10/13/2021   Lab Results  Component Value Date   ALT 19 07/09/2021   AST 27 07/09/2021   ALKPHOS 47 07/09/2021   BILITOT 0.8 07/09/2021   Lab Results  Component Value Date   CHOL 137 01/31/2021   HDL 35 (L) 01/31/2021   LDLCALC 68 01/31/2021   TRIG 204 (H) 01/31/2021   CHOLHDL 3.9 01/31/2021    No results found for: "HGBA1C"  Assessment & Plan   1.  Coronary artery disease with recent [place] MPI completed for intermittent chest discomfort located midsternal without radiation that has now resolved.  Lexiscan MPI was considered low risk with no significant ischemia identified, he also had a left ventricular systolic function greater than 65%.  Coronary artery calcification and stent were noted as well as aortic atherosclerosis.  EKG was completed today without ischemic concerns noted.  He has been continued on aspirin 81 mg daily, simvastatin 20 mg daily, and unfortunately had an intolerance to beta-blocker therapy.  2.  Paroxysmal atrial fibrillation with currently been in sinus rhythm today with PACs and sinus arrhythmia with a rate of 73.  He denies any increased palpitations today.  He is continued on Eliquis 2.5 mg twice daily with reduced dosing based on age and serum creatinine.  He was tried on Toprol-XL 12.5 mg daily for his palpitations but unfortunately was unable to tolerate the side  effects of this medication and stopped taking this medication  on his own.  3.  Hyperlipidemia with LDL of 71 on 08/30/2021.  Was continued on simvastatin 20 mg daily.  This continues to be followed by his PCP.  4.  Hypertension with blood pressure today of 130/70.  He is continued on Norvasc 5 mg a daily and valsartan HCTZ 160/25 mg daily.  He is to continue taking his blood pressures at home and keep a blood pressure log as well.  There were no other medication changes that were made today and no refills that were required.  5.  Chronic kidney disease with a baseline serum creatinine of 1.6-1.8 last serum creatinine of 1.70 done 10/13/2021.  This continues to be followed by PCP.  6.  Disposition patient to follow-up with Dr. Saunders Revel at 6 months return appointment.  He has been advised that he can have his colonoscopy on 01/24/2022 with holding his Eliquis 2 days prior to his procedure date.  We did discuss restarting his Eliquis the day after the procedure unless he has told otherwise from the gastroenterologist.  Macalister Arnaud, NP 11/08/2021, 2:39 PM

## 2021-11-08 ENCOUNTER — Ambulatory Visit: Payer: Medicare Other | Attending: Cardiology | Admitting: Cardiology

## 2021-11-08 ENCOUNTER — Encounter: Payer: Self-pay | Admitting: Cardiology

## 2021-11-08 VITALS — BP 130/70 | HR 73 | Ht 68.0 in | Wt 236.2 lb

## 2021-11-08 DIAGNOSIS — I2510 Atherosclerotic heart disease of native coronary artery without angina pectoris: Secondary | ICD-10-CM

## 2021-11-08 DIAGNOSIS — N1832 Chronic kidney disease, stage 3b: Secondary | ICD-10-CM

## 2021-11-08 DIAGNOSIS — E785 Hyperlipidemia, unspecified: Secondary | ICD-10-CM | POA: Diagnosis not present

## 2021-11-08 DIAGNOSIS — I480 Paroxysmal atrial fibrillation: Secondary | ICD-10-CM

## 2021-11-08 DIAGNOSIS — I10 Essential (primary) hypertension: Secondary | ICD-10-CM

## 2021-11-08 NOTE — Patient Instructions (Signed)
Medication Instructions:  Your physician recommends that you continue on your current medications as directed. Please refer to the Current Medication list given to you today.  *If you need a refill on your cardiac medications before your next appointment, please call your pharmacy*   Follow-Up: At [facility], you and your health needs are our priority.  As part of our continuing mission to provide you with exceptional heart care, we have created designated Provider Care Teams.  These Care Teams include your primary Cardiologist (physician) and Advanced Practice Providers (APPs -  Physician Assistants and Nurse Practitioners) who all work together to provide you with the care you need, when you need it.  We recommend signing up for the patient portal called "MyChart".  Sign up information is provided on this After Visit Summary.  MyChart is used to connect with patients for Virtual Visits (Telemedicine).  Patients are able to view lab/test results, encounter notes, upcoming appointments, etc.  Non-urgent messages can be sent to your provider as well.   To learn more about what you can do with MyChart, go to [email or web address].    Your next appointment:   6 month(s)  The format for your next appointment:   In Person  Provider:   You may see Nelva Bush, MD or one of the following Advanced Practice Providers on your designated Care Team:   Murray Hodgkins, NP Christell Faith, PA-C Cadence Kathlen Mody, PA-C Gerrie Nordmann, NP    Other Instructions  Important Information About Sugar

## 2021-11-25 ENCOUNTER — Inpatient Hospital Stay
Admission: EM | Admit: 2021-11-25 | Discharge: 2021-11-28 | DRG: 205 | Disposition: A | Discharge status: Home / Self Care | Payer: Medicare Other | Attending: Internal Medicine | Admitting: Internal Medicine

## 2021-11-25 ENCOUNTER — Emergency Department: Payer: Medicare Other

## 2021-11-25 ENCOUNTER — Other Ambulatory Visit: Payer: Self-pay

## 2021-11-25 DIAGNOSIS — Z8701 Personal history of pneumonia (recurrent): Secondary | ICD-10-CM

## 2021-11-25 DIAGNOSIS — Z7982 Long term (current) use of aspirin: Secondary | ICD-10-CM

## 2021-11-25 DIAGNOSIS — J690 Pneumonitis due to inhalation of food and vomit: Secondary | ICD-10-CM | POA: Diagnosis present

## 2021-11-25 DIAGNOSIS — J9601 Acute respiratory failure with hypoxia: Secondary | ICD-10-CM | POA: Diagnosis present

## 2021-11-25 DIAGNOSIS — J680 Bronchitis and pneumonitis due to chemicals, gases, fumes and vapors: Principal | ICD-10-CM | POA: Diagnosis present

## 2021-11-25 DIAGNOSIS — I10 Essential (primary) hypertension: Secondary | ICD-10-CM | POA: Diagnosis present

## 2021-11-25 DIAGNOSIS — J189 Pneumonia, unspecified organism: Secondary | ICD-10-CM | POA: Diagnosis present

## 2021-11-25 DIAGNOSIS — Z20822 Contact with and (suspected) exposure to covid-19: Secondary | ICD-10-CM | POA: Diagnosis present

## 2021-11-25 DIAGNOSIS — Z7901 Long term (current) use of anticoagulants: Secondary | ICD-10-CM

## 2021-11-25 DIAGNOSIS — I129 Hypertensive chronic kidney disease with stage 1 through stage 4 chronic kidney disease, or unspecified chronic kidney disease: Secondary | ICD-10-CM | POA: Diagnosis present

## 2021-11-25 DIAGNOSIS — R0602 Shortness of breath: Secondary | ICD-10-CM | POA: Diagnosis present

## 2021-11-25 DIAGNOSIS — A419 Sepsis, unspecified organism: Secondary | ICD-10-CM | POA: Diagnosis present

## 2021-11-25 DIAGNOSIS — K449 Diaphragmatic hernia without obstruction or gangrene: Secondary | ICD-10-CM | POA: Diagnosis present

## 2021-11-25 DIAGNOSIS — N1832 Chronic kidney disease, stage 3b: Secondary | ICD-10-CM | POA: Diagnosis present

## 2021-11-25 DIAGNOSIS — Z8551 Personal history of malignant neoplasm of bladder: Secondary | ICD-10-CM

## 2021-11-25 DIAGNOSIS — Z79899 Other long term (current) drug therapy: Secondary | ICD-10-CM

## 2021-11-25 DIAGNOSIS — I2510 Atherosclerotic heart disease of native coronary artery without angina pectoris: Secondary | ICD-10-CM | POA: Diagnosis present

## 2021-11-25 DIAGNOSIS — Z955 Presence of coronary angioplasty implant and graft: Secondary | ICD-10-CM

## 2021-11-25 DIAGNOSIS — K219 Gastro-esophageal reflux disease without esophagitis: Secondary | ICD-10-CM | POA: Diagnosis present

## 2021-11-25 DIAGNOSIS — Z87891 Personal history of nicotine dependence: Secondary | ICD-10-CM

## 2021-11-25 DIAGNOSIS — I480 Paroxysmal atrial fibrillation: Secondary | ICD-10-CM | POA: Diagnosis present

## 2021-11-25 DIAGNOSIS — E785 Hyperlipidemia, unspecified: Secondary | ICD-10-CM | POA: Diagnosis present

## 2021-11-25 DIAGNOSIS — K224 Dyskinesia of esophagus: Secondary | ICD-10-CM | POA: Diagnosis present

## 2021-11-25 LAB — TROPONIN I (HIGH SENSITIVITY)
Troponin I (High Sensitivity): 16 ng/L (ref <18)
Troponin I (High Sensitivity): 18 ng/L — ABNORMAL HIGH (ref <18)

## 2021-11-25 LAB — CBC WITH DIFFERENTIAL/PLATELET
Abs Immature Granulocytes: 0.02 10*3/uL (ref 0.00–0.07)
Basophils Absolute: 0.1 10*3/uL (ref 0.0–0.1)
Basophils Relative: 1 %
Eosinophils Absolute: 0.2 10*3/uL (ref 0.0–0.5)
Eosinophils Relative: 2 %
HCT: 43.1 % (ref 39.0–52.0)
Hemoglobin: 14.1 g/dL (ref 13.0–17.0)
Immature Granulocytes: 0 %
Lymphocytes Relative: 24 %
Lymphs Abs: 2.4 10*3/uL (ref 0.7–4.0)
MCH: 34.2 pg — ABNORMAL HIGH (ref 26.0–34.0)
MCHC: 32.7 g/dL (ref 30.0–36.0)
MCV: 104.6 fL — ABNORMAL HIGH (ref 80.0–100.0)
Monocytes Absolute: 0.6 10*3/uL (ref 0.1–1.0)
Monocytes Relative: 6 %
Neutro Abs: 6.6 10*3/uL (ref 1.7–7.7)
Neutrophils Relative %: 67 %
Platelets: 201 10*3/uL (ref 150–400)
RBC: 4.12 MIL/uL — ABNORMAL LOW (ref 4.22–5.81)
RDW: 14 % (ref 11.5–15.5)
WBC: 9.9 10*3/uL (ref 4.0–10.5)
nRBC: 0 % (ref 0.0–0.2)

## 2021-11-25 LAB — COMPREHENSIVE METABOLIC PANEL
ALT: 28 U/L (ref 0–44)
AST: 37 U/L (ref 15–41)
Albumin: 4 g/dL (ref 3.5–5.0)
Alkaline Phosphatase: 48 U/L (ref 38–126)
Anion gap: 9 (ref 5–15)
BUN: 27 mg/dL — ABNORMAL HIGH (ref 8–23)
CO2: 26 mmol/L (ref 22–32)
Calcium: 8.8 mg/dL — ABNORMAL LOW (ref 8.9–10.3)
Chloride: 107 mmol/L (ref 98–111)
Creatinine, Ser: 1.84 mg/dL — ABNORMAL HIGH (ref 0.61–1.24)
GFR, Estimated: 36 mL/min — ABNORMAL LOW (ref >60)
Glucose, Bld: 132 mg/dL — ABNORMAL HIGH (ref 70–99)
Potassium: 3.5 mmol/L (ref 3.5–5.1)
Sodium: 142 mmol/L (ref 135–145)
Total Bilirubin: 0.8 mg/dL (ref 0.3–1.2)
Total Protein: 6.8 g/dL (ref 6.5–8.1)

## 2021-11-25 LAB — RESP PANEL BY RT-PCR (FLU A&B, COVID) ARPGX2
Influenza A by PCR: NEGATIVE
Influenza B by PCR: NEGATIVE
SARS Coronavirus 2 by RT PCR: NEGATIVE

## 2021-11-25 LAB — PROCALCITONIN: Procalcitonin: <0.1 ng/mL

## 2021-11-25 LAB — LACTIC ACID, PLASMA: Lactic Acid, Venous: 1.4 mmol/L (ref 0.5–1.9)

## 2021-11-25 LAB — BRAIN NATRIURETIC PEPTIDE: B Natriuretic Peptide: 89.5 pg/mL (ref 0.0–100.0)

## 2021-11-25 MED ORDER — ASPIRIN 81 MG PO TBEC
81.0000 mg | DELAYED_RELEASE_TABLET | Freq: Every day | ORAL | Status: DC
  Administered 2021-11-25 – 2021-11-27 (×3): 81 mg via ORAL
  Filled 2021-11-25 (×3): qty 1

## 2021-11-25 MED ORDER — LACTATED RINGERS IV SOLN: INTRAVENOUS | Status: DC

## 2021-11-25 MED ORDER — AMLODIPINE BESYLATE 5 MG PO TABS
5.0000 mg | ORAL_TABLET | Freq: Every day | ORAL | Status: DC
  Administered 2021-11-25 – 2021-11-27 (×3): 5 mg via ORAL
  Filled 2021-11-25 (×3): qty 1

## 2021-11-25 MED ORDER — ONDANSETRON HCL 4 MG/2ML IJ SOLN: 4.0000 mg | Freq: Four times a day (QID) | INTRAMUSCULAR | Status: DC | PRN

## 2021-11-25 MED ORDER — IRBESARTAN 150 MG PO TABS
150.0000 mg | ORAL_TABLET | Freq: Every day | ORAL | Status: DC
  Administered 2021-11-26 – 2021-11-28 (×3): 150 mg via ORAL
  Filled 2021-11-25 (×3): qty 1

## 2021-11-25 MED ORDER — ACETAMINOPHEN 325 MG PO TABS: 650.0000 mg | ORAL_TABLET | Freq: Four times a day (QID) | ORAL | Status: DC | PRN

## 2021-11-25 MED ORDER — HYDROCHLOROTHIAZIDE 25 MG PO TABS
25.0000 mg | ORAL_TABLET | Freq: Every day | ORAL | Status: DC
  Administered 2021-11-26 – 2021-11-28 (×3): 25 mg via ORAL
  Filled 2021-11-25 (×3): qty 1

## 2021-11-25 MED ORDER — GUAIFENESIN ER 600 MG PO TB12
600.0000 mg | ORAL_TABLET | Freq: Two times a day (BID) | ORAL | Status: DC | PRN
  Administered 2021-11-25: 600 mg via ORAL
  Filled 2021-11-25: qty 1

## 2021-11-25 MED ORDER — ONDANSETRON HCL 4 MG PO TABS: 4.0000 mg | ORAL_TABLET | Freq: Four times a day (QID) | ORAL | Status: DC | PRN

## 2021-11-25 MED ORDER — APIXABAN 2.5 MG PO TABS
2.5000 mg | ORAL_TABLET | Freq: Two times a day (BID) | ORAL | Status: DC
  Administered 2021-11-25 – 2021-11-28 (×6): 2.5 mg via ORAL
  Filled 2021-11-25 (×7): qty 1

## 2021-11-25 MED ORDER — VALSARTAN-HYDROCHLOROTHIAZIDE 160-25 MG PO TABS: 1.0000 | ORAL_TABLET | Freq: Every day | ORAL | Status: DC

## 2021-11-25 MED ORDER — ALBUTEROL SULFATE (2.5 MG/3ML) 0.083% IN NEBU: 2.5000 mg | INHALATION_SOLUTION | RESPIRATORY_TRACT | Status: DC | PRN

## 2021-11-25 MED ORDER — ALBUTEROL SULFATE (2.5 MG/3ML) 0.083% IN NEBU
3.0000 mL | INHALATION_SOLUTION | RESPIRATORY_TRACT | Status: DC | PRN
  Administered 2021-11-25 (×2): 3 mL via RESPIRATORY_TRACT
  Filled 2021-11-25 (×2): qty 3

## 2021-11-25 MED ORDER — HYDRALAZINE HCL 20 MG/ML IJ SOLN: 5.0000 mg | INTRAMUSCULAR | Status: DC | PRN

## 2021-11-25 MED ORDER — ACETAMINOPHEN 650 MG RE SUPP: 650.0000 mg | Freq: Four times a day (QID) | RECTAL | Status: DC | PRN

## 2021-11-25 MED ORDER — SODIUM CHLORIDE 0.9 % IV SOLN
500.0000 mg | Freq: Once | INTRAVENOUS | Status: AC
  Administered 2021-11-25: 500 mg via INTRAVENOUS
  Filled 2021-11-25: qty 5

## 2021-11-25 MED ORDER — SODIUM CHLORIDE 0.9 % IV SOLN
500.0000 mg | INTRAVENOUS | Status: DC
  Administered 2021-11-26: 500 mg via INTRAVENOUS
  Filled 2021-11-25: qty 5
  Filled 2021-11-25: qty 500

## 2021-11-25 MED ORDER — IOHEXOL 350 MG/ML SOLN
75.0000 mL | Freq: Once | INTRAVENOUS | Status: AC | PRN
  Administered 2021-11-25: 75 mL via INTRAVENOUS

## 2021-11-25 MED ORDER — SODIUM CHLORIDE 0.9 % IV SOLN
2.0000 g | Freq: Once | INTRAVENOUS | Status: AC
  Administered 2021-11-25: 2 g via INTRAVENOUS
  Filled 2021-11-25: qty 20

## 2021-11-25 MED ORDER — SODIUM CHLORIDE 0.9 % IV SOLN
2.0000 g | INTRAVENOUS | Status: DC
  Administered 2021-11-26: 2 g via INTRAVENOUS
  Filled 2021-11-25: qty 20
  Filled 2021-11-25: qty 2

## 2021-11-25 MED ORDER — SIMVASTATIN 20 MG PO TABS
20.0000 mg | ORAL_TABLET | Freq: Every day | ORAL | Status: DC
  Administered 2021-11-25 – 2021-11-27 (×3): 20 mg via ORAL
  Filled 2021-11-25: qty 2
  Filled 2021-11-25 (×2): qty 1

## 2021-11-25 NOTE — Assessment & Plan Note (Signed)
Pt presenting with SOB for past few days.  He is acutely ill and coughing/   congested.  We will cont with supportive care and supplemental oxygen as needed. Nebs and MDI as needed.

## 2021-11-25 NOTE — Assessment & Plan Note (Signed)
IV ppi.  Aspiration precaution.

## 2021-11-25 NOTE — Assessment & Plan Note (Signed)
SpO2: 95 % O2 Flow Rate (L/min): 3 L/min Pt found ot have CAP and hypoxia required 3 L [place] . We will continue oxygen and IV abx with CAP protocol.  Cont albuterol MDI and Nebs.

## 2021-11-25 NOTE — ED Provider Notes (Addendum)
[facility] Provider Note    Event Date/Time   First MD Initiated Contact with Patient 11/25/21 1640     (approximate)   History   Shortness of Breath   HPI  Jason Kamel. is a 81 y.o. male  here with SOB. Pt has a h/o recurrent PNA per report. He reports that over the past several days he has had worsening cough SOB and weakness. He has had decreased appetite. He has felt generally very weak and is having difficulty getting around due to weakness. He checked his O2 at home and it was 88-90. He wears oxygen at night with his CPAP but not normally during the day. No known sick contacts. Pt has had some nasal congestion, lightheadedness with standing.     Physical Exam   Triage Vital Signs: ED Triage Vitals  Enc Vitals Group     BP 11/25/21 1605 (!) 172/86     Pulse Rate 11/25/21 1605 82     Resp 11/25/21 1605 20     Temp 11/25/21 1605 98.2 F (36.8 C)     Temp Source 11/25/21 1605 Oral     SpO2 11/25/21 1605 (!) 86 %     Weight 11/25/21 1611 235 lb 14.3 oz (107 kg)     Height 11/25/21 1611 '5\' 8"'$  (1.727 m)     Head Circumference --      Peak Flow --      Pain Score 11/25/21 1611 0     Pain Loc --      Pain Edu? --      Excl. in [place]? --     Most recent vital signs: Vitals:   11/25/21 1915 11/25/21 1923  BP:    Pulse: 73 87  Resp: (!) 31 (!) 26  Temp:    SpO2: (!) 89% 91%     General: Awake, no distress.  CV:  Good peripheral perfusion. RRR. No murmurs. Resp:  Normal effort. Rales/rhonchi in RLL. Abd:  No distention. No tenderness. Other:  No LE edema. Mildly dry MM.   ED Results / Procedures / Treatments   Labs (all labs ordered are listed, but only abnormal results are displayed) Labs Reviewed  CBC WITH DIFFERENTIAL/PLATELET - Abnormal; Notable for the following components:      Result Value   RBC 4.12 (*)    MCV 104.6 (*)    MCH 34.2 (*)    All other components within normal limits  COMPREHENSIVE METABOLIC PANEL -  Abnormal; Notable for the following components:   Glucose, Bld 132 (*)    BUN 27 (*)    Creatinine, Ser 1.84 (*)    Calcium 8.8 (*)    GFR, Estimated 36 (*)    All other components within normal limits  TROPONIN I (HIGH SENSITIVITY) - Abnormal; Notable for the following components:   Troponin I (High Sensitivity) 18 (*)    All other components within normal limits  RESP PANEL BY RT-PCR (FLU A&B, COVID) ARPGX2  CULTURE, BLOOD (ROUTINE X 2)  CULTURE, BLOOD (ROUTINE X 2)  BRAIN NATRIURETIC PEPTIDE  LACTIC ACID, PLASMA  PROCALCITONIN  LACTIC ACID, PLASMA  TROPONIN I (HIGH SENSITIVITY)     EKG Atrial fibrillation, VR 67. QRS 93, QTc 421. No acute st elevations or depressions. No ischemia or infarct.   RADIOLOGY CXR: RLL infiltrate   I also independently reviewed and agree with radiologist interpretations.   PROCEDURES:  Critical Care performed: No  .1-3 Lead EKG Interpretation  Performed by: Duffy Bruce, MD Authorized by: Duffy Bruce, MD     Interpretation: normal     ECG rate:  70-90   ECG rate assessment: normal     Rhythm: sinus rhythm     Ectopy: none     Conduction: normal   Comments:     Indication: SOB     MEDICATIONS ORDERED IN ED: Medications  albuterol (PROVENTIL) (2.5 MG/3ML) 0.083% nebulizer solution 3 mL (3 mLs Nebulization Given 11/25/21 1926)  cefTRIAXone (ROCEPHIN) 2 g in sodium chloride 0.9 % 100 mL IVPB (0 g Intravenous Stopped 11/25/21 1748)  azithromycin (ZITHROMAX) 500 mg in sodium chloride 0.9 % 250 mL IVPB (500 mg Intravenous New Bag/Given 11/25/21 1808)     IMPRESSION / MDM / [place] / ED COURSE  I reviewed the triage vital signs and the nursing notes.                               The patient is on the cardiac monitor to evaluate for evidence of arrhythmia and/or significant heart rate changes.   Ddx:  Differential includes the following, with pertinent life- or limb-threatening emergencies considered:  CAP,  CHF, ACS, PE, dissection, anemia  Patient's presentation is most consistent with acute presentation with potential threat to life or bodily function.  MDM:  81 yo M with h/o HTN, HLD, CAD, AFib, CKD, GERD here with cough and SOB. CXR shows R basilar infiltrate, and pt is hypoxic here. Lungs with R Basilar rales, no wheezing. No h/o COPD but reportedly has had recurrent PNA in his R lower lung. No known h/o CHF but he does have some edema. EKG is nonischemic. LA normal, CBC without leukocytosis. Given his XR findings, will cover empirically for CAP and admit, will also add on CT Angio for evaluation given his recurrence of PNA in this area as well as hypoxia somewhat out of proportion to hsi exam. Query possible CHF as well.     MEDICATIONS GIVEN IN ED: Medications  albuterol (PROVENTIL) (2.5 MG/3ML) 0.083% nebulizer solution 3 mL (3 mLs Nebulization Given 11/25/21 1926)  cefTRIAXone (ROCEPHIN) 2 g in sodium chloride 0.9 % 100 mL IVPB (0 g Intravenous Stopped 11/25/21 1748)  azithromycin (ZITHROMAX) 500 mg in sodium chloride 0.9 % 250 mL IVPB (500 mg Intravenous New Bag/Given 11/25/21 1808)     Consults:  Hospitalist   EMR reviewed  Prior echocardiogram, recent stress test which was negative     FINAL CLINICAL IMPRESSION(S) / ED DIAGNOSES   Final diagnoses:  Acute respiratory failure with hypoxia ([place])  Community acquired pneumonia of right lower lobe of lung     Rx / DC Orders   ED Discharge Orders     None        Note:  This document was prepared using Dragon voice recognition software and may include unintentional dictation errors.   Duffy Bruce, MD 11/25/21 Jeananne Rama    Duffy Bruce, MD 11/25/21 [phone]

## 2021-11-25 NOTE — ED Triage Notes (Signed)
First Nurse Note:  C/O SOB, HTN today.  States wears CPAP at night with oxygen.  Patient with congested cough noted.  No SOB/ DOE seen.  Skin warm and dry.

## 2021-11-25 NOTE — Assessment & Plan Note (Signed)
Lab Results  Component Value Date   CREATININE 1.84 (H) 11/25/2021   CREATININE 1.70 (H) 10/13/2021   CREATININE 1.55 (H) 07/12/2021  mild worsening of creatinine. We will avoid contrast.  renally dose all needed meds. Cautious diuretic therapy as needed.

## 2021-11-25 NOTE — Assessment & Plan Note (Signed)
Pt meets sepsis criteria due to Blood pressure (!) 145/73, pulse 99, temperature 97.8 F (36.6 C), temperature source Oral, resp. rate (!) 22, height '5\' 8"'$  (1.727 m), weight 107 kg, SpO2 95 %. respiration/ source of infection/ HR and new oxygen requirement.  Continue supportive care/.  IVF bolus not initiated as pt is normotensive not dehydrated appearing.

## 2021-11-25 NOTE — Assessment & Plan Note (Signed)
Stable no complaints of chest pain or pressure.  Cont asa 81 / Simvastatin '20mg'$ / eliquis 2.5 mg.

## 2021-11-25 NOTE — Assessment & Plan Note (Signed)
Cont rocephin and azithromycin.

## 2021-11-25 NOTE — Assessment & Plan Note (Addendum)
currently in a.fib rvr intermittently.  Cont eliquis.  Cont Diovan hct and amlodipine.

## 2021-11-25 NOTE — ED Provider Triage Note (Signed)
Emergency Medicine Provider Triage Evaluation Note  Jason Gates , a 81 y.o. male  was evaluated in triage.  Pt complains of shortness of breath, feels weak.  History of A-fib..  Review of Systems  Positive: Weakness, shortness of breath Negative: Fever  Physical Exam  BP (!) 172/86 (BP Location: Left Arm)   Pulse 82   Temp 98.2 F (36.8 C) (Oral)   Resp 20   Ht '5\' 8"'$  (1.727 m)   Wt 107 kg   SpO2 (!) 86%   BMI 35.87 kg/m  Gen:   Awake, no distress   Resp:  Without cough, low O2 MSK:   Moves extremities without difficulty  Other:    Medical Decision Making  Medically screening exam initiated at 4:16 PM.  Appropriate orders placed.  Bronwen Betters Jason Gates. was informed that the remainder of the evaluation will be completed by another provider, this initial triage assessment does not replace that evaluation, and the importance of remaining in the ED until their evaluation is complete.  Nursing staff to place patient in a bed immediately.  O2 saturation on room air has been in the mid 80s, only got up to 90% room air with 3 L O2.   Versie Starks, PA-C 11/25/21 1617

## 2021-11-25 NOTE — ED Triage Notes (Signed)
Pt states sob, hypertension, lightheaded congested started today.

## 2021-11-25 NOTE — H&P (Signed)
History and Physical    Chief Complaint: SOB.   HISTORY OF PRESENT ILLNESS: Jason Gates. is an 81 y.o. male  seen in ed for SOB for past few days.  Pt has h/OBJECTIVE: recurrent PNA.Pt has cpap at night and no ill contacts.  Noted his oxygen going down at home on pulse oximetry.    Pt has PMH as below: Past Medical History:  Diagnosis Date   Atrial fibrillation ([place])    Bladder cancer ([place])    Chronic kidney disease (CKD)    Coronary artery disease     Review Of Systems: Review of Systems  Constitutional:  Positive for fatigue.  Respiratory:  Positive for cough, shortness of breath and wheezing.   Neurological:  Positive for weakness.  All other systems reviewed and are negative.    ALLERGIES: Allergies  Allergen Reactions   K Phos Mono-Sod Phos Di & Mono Diarrhea   Sulfur Dioxide Hives   Elemental Sulfur Other (See Comments)    intolerance   Phosphorus Other (See Comments)    intolerance   Potassium Phosphate     Other reaction(s): Diarrhea   Amoxicillin Rash and Hives    PAST SURGICAL HISTORY: Past Surgical History:  Procedure Laterality Date   CARDIAC SURGERY     CHOLECYSTECTOMY     CORONARY ANGIOPLASTY     "two stents in 2007"   EYE SURGERY     NASAL SEPTUM SURGERY       SOCIAL HISTORY: Social History   Socioeconomic History   Marital status: Widowed    Spouse name: Not on file   Number of children: Not on file   Years of education: Not on file   Highest education level: Not on file  Occupational History   Not on file  Tobacco Use   Smoking status: Former    Packs/day: 1.00    Years: 15.00    Total pack years: 15.00    Types: Cigarettes    Quit date: 38    Years since quitting: 47.7   Smokeless tobacco: Not on file   Tobacco comments:    Quit smoking in 1976  Vaping Use   Vaping Use: Never used  Substance and Sexual Activity   Alcohol use: Not Currently    Comment: quit in 1976   Drug use: Never   Sexual activity: Not  on file  Other Topics Concern   Not on file  Social History Narrative   Not on file   Social Determinants of Health   Financial Resource Strain: Not on file  Food Insecurity: Not on file  Transportation Needs: Not on file  Physical Activity: Not on file  Stress: Not on file  Social Connections: Not on file      CURRENT MEDS:    Current Facility-Administered Medications (Cardiovascular):    amLODipine (NORVASC) tablet 5 mg   hydrALAZINE (APRESOLINE) injection 5 mg   [START ON 11/26/2021] irbesartan (AVAPRO) tablet 150 mg **AND** [START ON 11/26/2021] hydrochlorothiazide (HYDRODIURIL) tablet 25 mg   simvastatin (ZOCOR) tablet 20 mg  Current Outpatient Medications (Cardiovascular):    amLODipine (NORVASC) 5 MG tablet, Take 5 mg by mouth at bedtime.   DIOVAN HCT 160-25 MG tablet, TAKE 1 TABLET DAILY   simvastatin (ZOCOR) 20 MG tablet, Take 20 mg by mouth daily.  Current Facility-Administered Medications (Respiratory):    albuterol (PROVENTIL) (2.5 MG/3ML) 0.083% nebulizer solution 2.5 mg   albuterol (PROVENTIL) (2.5 MG/3ML) 0.083% nebulizer solution 3 mL   guaiFENesin (MUCINEX) 12  hr tablet 600 mg  Current Outpatient Medications (Respiratory):    fluticasone (FLONASE) 50 MCG/ACT nasal spray, Place 1 spray into both nostrils daily.  Current Facility-Administered Medications (Analgesics):    acetaminophen (TYLENOL) tablet 650 mg **OR** acetaminophen (TYLENOL) suppository 650 mg   aspirin EC tablet 81 mg  Current Outpatient Medications (Analgesics):    aspirin 81 MG tablet, Take 81 mg by mouth at bedtime.  Current Facility-Administered Medications (Hematological):    apixaban (ELIQUIS) tablet 2.5 mg  Current Outpatient Medications (Hematological):    apixaban (ELIQUIS) 2.5 MG TABS tablet, Take 1 tablet (2.5 mg total) by mouth 2 (two) times daily.   vitamin B-12 100 MCG tablet, Take 1 tablet (100 mcg total) by mouth daily. (Patient not taking: Reported on  11/25/2021)  Current Facility-Administered Medications (Other):    [START ON 11/26/2021] azithromycin (ZITHROMAX) 500 mg in sodium chloride 0.9 % 250 mL IVPB   [START ON 11/26/2021] cefTRIAXone (ROCEPHIN) 2 g in sodium chloride 0.9 % 100 mL IVPB   lactated ringers infusion   ondansetron (ZOFRAN) tablet 4 mg **OR** ondansetron (ZOFRAN) injection 4 mg  Current Outpatient Medications (Other):    carboxymethylcellulose (REFRESH PLUS) 0.5 % SOLN, Place 1 drop into the right eye daily as needed (dry eyes).   Cholecalciferol (VITAMIN D) 125 MCG (5000 UT) CAPS, Take 5,000 Units by mouth daily.   Nutritional Supplements (BLADDER 2.2 PO), Take 1 tablet by mouth daily.   prednisoLONE acetate (PRED FORTE) 1 % ophthalmic suspension, Place 1 drop into the left eye daily.   RABEprazole (ACIPHEX) 20 MG tablet, Take 1 tablet (20 mg total) by mouth daily. (Patient taking differently: Take 20 mg by mouth at bedtime.)   valACYclovir (VALTREX) 500 MG tablet, Take 500 mg by mouth at bedtime.    ED Course: Pt in Ed is ill appearing hypoxic but afebrile.meeting sepsis criteria.  Vitals:   11/25/21 1915 11/25/21 1923 11/25/21 1930 11/25/21 2110  BP:   (!) 148/79 (!) 145/73  Pulse: 73 87 79 99  Resp: (!) 31 (!) 26 (!) 22   Temp:    97.8 F (36.6 C)  TempSrc:    Oral  SpO2: (!) 89% 91% 100% 95%  Weight:      Height:       Total I/O In: 250 [IV Piggyback:250] Out: -  SpO2: 95 % O2 Flow Rate (L/min): 3 L/min Blood work in ed shows macrocytosis , mild worsening of creatinine.  Results for orders placed or performed during the hospital encounter of 11/25/21 (from the past 24 hour(s))  CBC with Differential     Status: Abnormal   Collection Time: 11/25/21  5:00 PM  Result Value Ref Range   WBC 9.9 4.0 - 10.5 K/uL   RBC 4.12 (L) 4.22 - 5.81 MIL/uL   Hemoglobin 14.1 13.0 - 17.0 g/dL   HCT 43.1 39.0 - 52.0 %   MCV 104.6 (H) 80.0 - 100.0 fL   MCH 34.2 (H) 26.0 - 34.0 pg   MCHC 32.7 30.0 - 36.0 g/dL   RDW  14.0 11.5 - 15.5 %   Platelets 201 150 - 400 K/uL   nRBC 0.0 0.0 - 0.2 %   Neutrophils Relative % 67 %   Neutro Abs 6.6 1.7 - 7.7 K/uL   Lymphocytes Relative 24 %   Lymphs Abs 2.4 0.7 - 4.0 K/uL   Monocytes Relative 6 %   Monocytes Absolute 0.6 0.1 - 1.0 K/uL   Eosinophils Relative 2 %   Eosinophils Absolute  0.2 0.0 - 0.5 K/uL   Basophils Relative 1 %   Basophils Absolute 0.1 0.0 - 0.1 K/uL   Immature Granulocytes 0 %   Abs Immature Granulocytes 0.02 0.00 - 0.07 K/uL  Brain natriuretic peptide     Status: None   Collection Time: 11/25/21  5:00 PM  Result Value Ref Range   B Natriuretic Peptide 89.5 0.0 - 100.0 pg/mL  Resp Panel by RT-PCR (Flu A&B, Covid) Anterior Nasal Swab     Status: None   Collection Time: 11/25/21  5:00 PM   Specimen: Anterior Nasal Swab  Result Value Ref Range   SARS Coronavirus 2 by RT PCR NEGATIVE NEGATIVE   Influenza A by PCR NEGATIVE NEGATIVE   Influenza B by PCR NEGATIVE NEGATIVE  Troponin I (High Sensitivity)     Status: None   Collection Time: 11/25/21  5:00 PM  Result Value Ref Range   Troponin I (High Sensitivity) 16 <18 ng/L  Comprehensive metabolic panel     Status: Abnormal   Collection Time: 11/25/21  5:00 PM  Result Value Ref Range   Sodium 142 135 - 145 mmol/L   Potassium 3.5 3.5 - 5.1 mmol/L   Chloride 107 98 - 111 mmol/L   CO2 26 22 - 32 mmol/L   Glucose, Bld 132 (H) 70 - 99 mg/dL   BUN 27 (H) 8 - 23 mg/dL   Creatinine, Ser 1.84 (H) 0.61 - 1.24 mg/dL   Calcium 8.8 (L) 8.9 - 10.3 mg/dL   Total Protein 6.8 6.5 - 8.1 g/dL   Albumin 4.0 3.5 - 5.0 g/dL   AST 37 15 - 41 U/L   ALT 28 0 - 44 U/L   Alkaline Phosphatase 48 38 - 126 U/L   Total Bilirubin 0.8 0.3 - 1.2 mg/dL   GFR, Estimated 36 (L) >60 mL/min   Anion gap 9 5 - 15  Lactic acid, plasma     Status: None   Collection Time: 11/25/21  5:00 PM  Result Value Ref Range   Lactic Acid, Venous 1.4 0.5 - 1.9 mmol/L  Procalcitonin - Baseline     Status: None   Collection Time:  11/25/21  5:00 PM  Result Value Ref Range   Procalcitonin <0.10 ng/mL  Troponin I (High Sensitivity)     Status: Abnormal   Collection Time: 11/25/21  6:10 PM  Result Value Ref Range   Troponin I (High Sensitivity) 18 (H) <18 ng/L   In Ed pt received  Meds ordered this encounter  Medications   albuterol (PROVENTIL) (2.5 MG/3ML) 0.083% nebulizer solution 3 mL   cefTRIAXone (ROCEPHIN) 2 g in sodium chloride 0.9 % 100 mL IVPB    Order Specific Question:   Antibiotic Indication:    Answer:   CAP   azithromycin (ZITHROMAX) 500 mg in sodium chloride 0.9 % 250 mL IVPB   iohexol (OMNIPAQUE) 350 MG/ML injection 75 mL   apixaban (ELIQUIS) tablet 2.5 mg   amLODipine (NORVASC) tablet 5 mg   aspirin EC tablet 81 mg   DISCONTD: valsartan-hydrochlorothiazide (DIOVAN-HCT) 160-25 MG per tablet 1 tablet   simvastatin (ZOCOR) tablet 20 mg   cefTRIAXone (ROCEPHIN) 2 g in sodium chloride 0.9 % 100 mL IVPB    Order Specific Question:   Antibiotic Indication:    Answer:   CAP   azithromycin (ZITHROMAX) 500 mg in sodium chloride 0.9 % 250 mL IVPB    Order Specific Question:   Antibiotic Indication:    Answer:   CAP  OR Linked Order Group    acetaminophen (TYLENOL) tablet 650 mg    acetaminophen (TYLENOL) suppository 650 mg   OR Linked Order Group    ondansetron (ZOFRAN) tablet 4 mg    ondansetron (ZOFRAN) injection 4 mg   albuterol (PROVENTIL) (2.5 MG/3ML) 0.083% nebulizer solution 2.5 mg   guaiFENesin (MUCINEX) 12 hr tablet 600 mg   hydrALAZINE (APRESOLINE) injection 5 mg   lactated ringers infusion   AND Linked Order Group    irbesartan (AVAPRO) tablet 150 mg    hydrochlorothiazide (HYDRODIURIL) tablet 25 mg    Unresulted Labs (From admission, onward)     Start     Ordered   11/25/21 2035  Strep pneumoniae urinary antigen  (COPD / Pneumonia / Cellulitis / Lower Extremity Wound)  Once,   URGENT        11/25/21 2036   11/25/21 1648  Blood culture (routine x 2)  BLOOD CULTURE X 2,   STAT       11/25/21 1647             Admission Imaging : CT Angio Chest PE W and/or Wo Contrast  Result Date: 11/25/2021 CLINICAL DATA:  Short of breath, hypertension, sleep apnea EXAM: CT ANGIOGRAPHY CHEST WITH CONTRAST TECHNIQUE: Multidetector CT imaging of the chest was performed using the standard protocol during bolus administration of intravenous contrast. Multiplanar CT image reconstructions and MIPs were obtained to evaluate the vascular anatomy. RADIATION DOSE REDUCTION: This exam was performed according to the departmental dose-optimization program which includes automated exposure control, adjustment of the mA and/or kV according to patient size and/or use of iterative reconstruction technique. CONTRAST:  82m OMNIPAQUE IOHEXOL 350 MG/ML SOLN COMPARISON:  11/25/2021 FINDINGS: Cardiovascular: This is a technically adequate evaluation of the pulmonary vasculature. No filling defects or pulmonary emboli. The heart is unremarkable without pericardial effusion. Atherosclerosis throughout the coronary vasculature greatest in the LAD distribution. No evidence of thoracic aortic aneurysm or dissection. Atherosclerosis of the aortic arch and descending thoracic aorta. Mediastinum/Nodes: Right paratracheal and right hilar adenopathy identified. Largest lymph node at the right hilum measures 19 mm in short axis. Thyroid, trachea, and esophagus are grossly unremarkable. Small hiatal hernia. Lungs/Pleura: There is bilateral bronchial wall thickening, greatest in the lower lobes. Multifocal bilateral airspace disease is identified, greatest at the lung bases, most pronounced within the right lower lobe and lingula. No effusion or pneumothorax. Upper Abdomen: No acute abnormality. Musculoskeletal: No acute or destructive bony lesions. Reconstructed images demonstrate no additional findings. Review of the MIP images confirms the above findings. IMPRESSION: 1. No evidence of pulmonary embolus. 2. Bilateral bronchial wall  thickening and multifocal bilateral airspace disease, greatest at the lung bases. Favor multifocal pneumonia over aspiration. 3. Likely reactive mediastinal and right hilar lymphadenopathy. 4. Aortic Atherosclerosis (ICD10-I70.0). Coronary artery atherosclerosis. Electronically Signed   By: MRanda NgoM.D.   On: 11/25/2021 20:15   DG Chest Port 1 View  Result Date: 11/25/2021 CLINICAL DATA:  Shortness of breath. EXAM: PORTABLE CHEST 1 VIEW COMPARISON:  10/13/2021 FINDINGS: Heart size is normal. There is focal patchy opacity in the RIGHT LOWER lobe, consistent with atelectasis or early infiltrate. LEFT lung is clear. No pulmonary edema. IMPRESSION: Suspect early RIGHT LOWER lobe infiltrate. Electronically Signed   By: ENolon NationsM.D.   On: 11/25/2021 16:41      Physical Examination: Vitals:   11/25/21 1915 11/25/21 1923 11/25/21 1930 11/25/21 2110  BP:   (!) 148/79 (!) 145/73  Pulse: 73 87 79  99  Temp:    97.8 F (36.6 C)  Resp: (!) 31 (!) 26 (!) 22   Height:      Weight:      SpO2: (!) 89% 91% 100% 95%  TempSrc:    Oral  BMI (Calculated):       Physical Exam Vitals and nursing note reviewed.  Constitutional:      General: He is not in acute distress.    Appearance: He is obese. He is not ill-appearing, toxic-appearing or diaphoretic.     Interventions: Nasal cannula in place.  HENT:     Head: Normocephalic and atraumatic.     Right Ear: Hearing and external ear normal.     Left Ear: Hearing and external ear normal. Tympanic membrane is perforated.     Ears:      Nose: Nose normal. No nasal deformity.     Mouth/Throat:     Lips: Pink.     Mouth: Mucous membranes are moist.     Tongue: No lesions.     Pharynx: Oropharynx is clear.  Eyes:     Extraocular Movements: Extraocular movements intact.     Pupils: Pupils are equal, round, and reactive to light.  Cardiovascular:     Rate and Rhythm: Regular rhythm. Tachycardia present.     Pulses: Normal pulses.     Heart  sounds: Normal heart sounds.  Pulmonary:     Effort: Pulmonary effort is normal.     Breath sounds: Wheezing present.  Abdominal:     General: Bowel sounds are normal. There is no distension.     Palpations: Abdomen is soft. There is no mass.     Tenderness: There is no abdominal tenderness. There is no guarding.     Hernia: No hernia is present.  Musculoskeletal:     Right lower leg: No edema.     Left lower leg: No edema.  Skin:    General: Skin is warm.  Neurological:     General: No focal deficit present.     Mental Status: He is alert and oriented to person, place, and time.     Cranial Nerves: Cranial nerves 2-12 are intact.     Motor: Motor function is intact.  Psychiatric:        Attention and Perception: Attention normal.        Mood and Affect: Mood normal.        Speech: Speech normal.        Behavior: Behavior normal. Behavior is cooperative.        Cognition and Memory: Cognition normal.        Assessment and Plan: * SOB (shortness of breath) Pt presenting with SOB for past few days.  He is acutely ill and coughing/   congested.  We will cont with supportive care and supplemental oxygen as needed. Nebs and MDI as needed.    Acute respiratory failure with hypoxia (HCC) SpO2: 95 % O2 Flow Rate (L/min): 3 L/min Pt found ot have CAP and hypoxia required 3 L [place] . We will continue oxygen and IV abx with CAP protocol.  Cont albuterol MDI and Nebs.   CAP (community acquired pneumonia) Cont rocephin and azithromycin.   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 We will avoid contrast.  renally dose all needed meds. Cautious diuretic  therapy as needed.    Sepsis due to pneumonia ([place]) Pt meets sepsis criteria due to Blood pressure (!) 145/73, pulse 99, temperature 97.8 F (36.6 C),  temperature source Oral, resp. rate (!) 22, height '5\' 8"'$  (1.727 m), weight 107 kg, SpO2 95 %. respiration/ source of infection/ HR and new oxygen requirement.  Continue supportive care/.  IVF bolus not initiated as pt is normotensive not dehydrated appearing.    Gastroesophageal reflux disease IV ppi.  Aspiration precaution.   Essential hypertension Cont  Vitals:   11/25/21 1605 11/25/21 1715 11/25/21 1730 11/25/21 1800  BP: (!) 172/86 139/75 (!) 158/69 (!) 159/80   11/25/21 1900 11/25/21 1930 11/25/21 2110  BP: (!) 168/103 (!) 148/79 (!) 145/73  cont diovan hctz/ amlodipine and PRN hydralazine.    Paroxysmal atrial fibrillation (HCC) currently in a.fib rvr intermittently.  Cont eliquis.  Cont Diovan hct and amlodipine.   Coronary artery disease involving native coronary artery of native heart without angina pectoris Stable no complaints of chest pain or pressure.  Cont asa 81 / Simvastatin '20mg'$ / eliquis 2.5 mg.     DVT prophylaxis:  Eliquis.   Code Status:  Full Code    Family Communication:  Deran, Barro (Son)  [phone] [facility] Phone)   Disposition Plan:  Home    Consults called:  None   Admission status: Observation    Unit/ Expected LOS: Med tel.    Para Skeans MD [facility]  6 PM- 2 AM. Please contact me via secure Chat 6 PM-2 AM. [phone] ( Pager ) To contact the [facility] Attending or Consulting provider [place] or covering provider during after hours [place], for this patient.   Check the care team in [facility] and look for a) attending/consulting [facility] provider listed and b) the [facility] team listed Log into [email or web address] and use 's universal password to access. If you do not have the password, please contact the hospital operator. Locate the [facility] provider you are looking for under [facility] and page to a number that you can be directly reached. If you still have difficulty reaching the provider, please page the [facility] (Director on Call)  for the Hospitalists listed on amion for assistance. [email or web address] 11/25/2021, 10:14 PM

## 2021-11-25 NOTE — Assessment & Plan Note (Signed)
Cont  Vitals:   11/25/21 1605 11/25/21 1715 11/25/21 1730 11/25/21 1800  BP: (!) 172/86 139/75 (!) 158/69 (!) 159/80   11/25/21 1900 11/25/21 1930 11/25/21 2110  BP: (!) 168/103 (!) 148/79 (!) 145/73  cont diovan hctz/ amlodipine and PRN hydralazine.

## 2021-11-26 ENCOUNTER — Encounter: Payer: Self-pay | Admitting: Internal Medicine

## 2021-11-26 DIAGNOSIS — N1832 Chronic kidney disease, stage 3b: Secondary | ICD-10-CM | POA: Diagnosis present

## 2021-11-26 DIAGNOSIS — J690 Pneumonitis due to inhalation of food and vomit: Secondary | ICD-10-CM | POA: Diagnosis present

## 2021-11-26 DIAGNOSIS — J9601 Acute respiratory failure with hypoxia: Secondary | ICD-10-CM | POA: Diagnosis present

## 2021-11-26 DIAGNOSIS — K449 Diaphragmatic hernia without obstruction or gangrene: Secondary | ICD-10-CM | POA: Diagnosis present

## 2021-11-26 DIAGNOSIS — Z7901 Long term (current) use of anticoagulants: Secondary | ICD-10-CM | POA: Diagnosis not present

## 2021-11-26 DIAGNOSIS — Z7982 Long term (current) use of aspirin: Secondary | ICD-10-CM | POA: Diagnosis not present

## 2021-11-26 DIAGNOSIS — Z955 Presence of coronary angioplasty implant and graft: Secondary | ICD-10-CM | POA: Diagnosis not present

## 2021-11-26 DIAGNOSIS — E785 Hyperlipidemia, unspecified: Secondary | ICD-10-CM | POA: Diagnosis present

## 2021-11-26 DIAGNOSIS — I2510 Atherosclerotic heart disease of native coronary artery without angina pectoris: Secondary | ICD-10-CM | POA: Diagnosis present

## 2021-11-26 DIAGNOSIS — J680 Bronchitis and pneumonitis due to chemicals, gases, fumes and vapors: Secondary | ICD-10-CM | POA: Diagnosis present

## 2021-11-26 DIAGNOSIS — I129 Hypertensive chronic kidney disease with stage 1 through stage 4 chronic kidney disease, or unspecified chronic kidney disease: Secondary | ICD-10-CM | POA: Diagnosis present

## 2021-11-26 DIAGNOSIS — Z8551 Personal history of malignant neoplasm of bladder: Secondary | ICD-10-CM | POA: Diagnosis not present

## 2021-11-26 DIAGNOSIS — J189 Pneumonia, unspecified organism: Secondary | ICD-10-CM | POA: Diagnosis present

## 2021-11-26 DIAGNOSIS — Z20822 Contact with and (suspected) exposure to covid-19: Secondary | ICD-10-CM | POA: Diagnosis present

## 2021-11-26 DIAGNOSIS — K219 Gastro-esophageal reflux disease without esophagitis: Secondary | ICD-10-CM | POA: Diagnosis present

## 2021-11-26 DIAGNOSIS — Z8701 Personal history of pneumonia (recurrent): Secondary | ICD-10-CM | POA: Diagnosis not present

## 2021-11-26 DIAGNOSIS — Z87891 Personal history of nicotine dependence: Secondary | ICD-10-CM | POA: Diagnosis not present

## 2021-11-26 DIAGNOSIS — I480 Paroxysmal atrial fibrillation: Secondary | ICD-10-CM | POA: Diagnosis present

## 2021-11-26 DIAGNOSIS — K224 Dyskinesia of esophagus: Secondary | ICD-10-CM | POA: Diagnosis present

## 2021-11-26 DIAGNOSIS — R0602 Shortness of breath: Secondary | ICD-10-CM | POA: Diagnosis present

## 2021-11-26 DIAGNOSIS — Z79899 Other long term (current) drug therapy: Secondary | ICD-10-CM | POA: Diagnosis not present

## 2021-11-26 LAB — BASIC METABOLIC PANEL
Anion gap: 7 (ref 5–15)
BUN: 28 mg/dL — ABNORMAL HIGH (ref 8–23)
CO2: 29 mmol/L (ref 22–32)
Calcium: 8.6 mg/dL — ABNORMAL LOW (ref 8.9–10.3)
Chloride: 105 mmol/L (ref 98–111)
Creatinine, Ser: 1.7 mg/dL — ABNORMAL HIGH (ref 0.61–1.24)
GFR, Estimated: 40 mL/min — ABNORMAL LOW (ref >60)
Glucose, Bld: 146 mg/dL — ABNORMAL HIGH (ref 70–99)
Potassium: 3.9 mmol/L (ref 3.5–5.1)
Sodium: 141 mmol/L (ref 135–145)

## 2021-11-26 MED ORDER — CARBOXYMETHYLCELLULOSE SODIUM 0.5 % OP SOLN: 1.0000 [drp] | Freq: Every day | OPHTHALMIC | Status: DC | PRN

## 2021-11-26 MED ORDER — CARBOXYMETHYLCELLULOSE SODIUM 1 % OP SOLN: 1.0000 [drp] | Freq: Every day | OPHTHALMIC | Status: DC | PRN

## 2021-11-26 MED ORDER — PREDNISOLONE ACETATE 1 % OP SUSP
1.0000 [drp] | Freq: Every day | OPHTHALMIC | Status: DC
  Administered 2021-11-26 – 2021-11-28 (×3): 1 [drp] via OPHTHALMIC
  Filled 2021-11-26: qty 1

## 2021-11-26 MED ORDER — FLUTICASONE PROPIONATE 50 MCG/ACT NA SUSP
1.0000 | Freq: Every day | NASAL | Status: DC
  Administered 2021-11-26 – 2021-11-28 (×3): 1 via NASAL
  Filled 2021-11-26: qty 16

## 2021-11-26 MED ORDER — DM-GUAIFENESIN ER 30-600 MG PO TB12
1.0000 | ORAL_TABLET | Freq: Two times a day (BID) | ORAL | Status: DC
  Administered 2021-11-26 – 2021-11-27 (×3): 1 via ORAL
  Filled 2021-11-26 (×4): qty 1

## 2021-11-26 MED ORDER — BENZONATATE 100 MG PO CAPS
200.0000 mg | ORAL_CAPSULE | Freq: Three times a day (TID) | ORAL | Status: DC
  Administered 2021-11-26 – 2021-11-27 (×3): 200 mg via ORAL
  Filled 2021-11-26 (×4): qty 2

## 2021-11-26 MED ORDER — PANTOPRAZOLE SODIUM 40 MG PO TBEC
40.0000 mg | DELAYED_RELEASE_TABLET | Freq: Every day | ORAL | Status: DC
  Administered 2021-11-26 – 2021-11-28 (×3): 40 mg via ORAL
  Filled 2021-11-26 (×3): qty 1

## 2021-11-26 MED ORDER — HYDROCOD POLI-CHLORPHE POLI ER 10-8 MG/5ML PO SUER
5.0000 mL | Freq: Every day | ORAL | Status: DC
  Administered 2021-11-26 – 2021-11-27 (×2): 5 mL via ORAL
  Filled 2021-11-26 (×2): qty 5

## 2021-11-26 MED ORDER — VITAMIN D 25 MCG (1000 UNIT) PO TABS
5000.0000 [IU] | ORAL_TABLET | Freq: Every day | ORAL | Status: DC
  Administered 2021-11-26 – 2021-11-28 (×3): 5000 [IU] via ORAL
  Filled 2021-11-26 (×3): qty 5

## 2021-11-26 MED ORDER — VALACYCLOVIR HCL 500 MG PO TABS
500.0000 mg | ORAL_TABLET | Freq: Every day | ORAL | Status: DC
  Administered 2021-11-26 – 2021-11-27 (×2): 500 mg via ORAL
  Filled 2021-11-26 (×2): qty 1

## 2021-11-26 MED ORDER — REFRESH P.M. OP OINT: TOPICAL_OINTMENT | OPHTHALMIC | Status: DC | PRN

## 2021-11-26 MED ORDER — POLYVINYL ALCOHOL 1.4 % OP SOLN
1.0000 [drp] | OPHTHALMIC | Status: DC | PRN
  Filled 2021-11-26: qty 15

## 2021-11-26 NOTE — Progress Notes (Signed)
[place] at [place] NAME: Jason Gates    MR#:  [id number]  DATE OF BIRTH:  03-12-1940  SUBJECTIVE:   In with generalized weakness in and chronic cough with increasing phlegm production. No fever. Complains of coughing while swallowing. Son at bedside. Unable to sleep mainly at bedtime due to constant coughing.   VITALS:  Blood pressure (!) 152/66, pulse 74, temperature 97.9 F (36.6 C), resp. rate 16, height '5\' 8"'$  (1.727 m), weight 107 kg, SpO2 96 %.  PHYSICAL EXAMINATION:   GENERAL:  81 y.o.-year-old patient lying in the bed with no acute distress. weak LUNGS: coarse breath sounds bilaterally, no wheezing, rales, rhonchi.  CARDIOVASCULAR: S1, S2 normal. No murmurs, rubs, or gallops.  ABDOMEN: Soft, nontender, nondistended. Bowel sounds present.  EXTREMITIES: No  edema b/l.    NEUROLOGIC: nonfocal  patient is alert and awake SKIN: No obvious rash, lesion, or ulcer.   LABORATORY PANEL:  CBC Recent Labs  Lab 11/25/21 1700  WBC 9.9  HGB 14.1  HCT 43.1  PLT 201    Chemistries  Recent Labs  Lab 11/25/21 1700 11/26/21 0913  NA 142 141  K 3.5 3.9  CL 107 105  CO2 26 29  GLUCOSE 132* 146*  BUN 27* 28*  CREATININE 1.84* 1.70*  CALCIUM 8.8* 8.6*  AST 37  --   ALT 28  --   ALKPHOS 48  --   BILITOT 0.8  --    Cardiac Enzymes No results for input(s): "TROPONINI" in the last 168 hours. RADIOLOGY:  CT Angio Chest PE W and/or Wo Contrast  Result Date: 11/25/2021 CLINICAL DATA:  Short of breath, hypertension, sleep apnea EXAM: CT ANGIOGRAPHY CHEST WITH CONTRAST TECHNIQUE: Multidetector CT imaging of the chest was performed using the standard protocol during bolus administration of intravenous contrast. Multiplanar CT image reconstructions and MIPs were obtained to evaluate the vascular anatomy. RADIATION DOSE REDUCTION: This exam was performed according to the departmental dose-optimization program which includes automated  exposure control, adjustment of the mA and/or kV according to patient size and/or use of iterative reconstruction technique. CONTRAST:  51m OMNIPAQUE IOHEXOL 350 MG/ML SOLN COMPARISON:  11/25/2021 FINDINGS: Cardiovascular: This is a technically adequate evaluation of the pulmonary vasculature. No filling defects or pulmonary emboli. The heart is unremarkable without pericardial effusion. Atherosclerosis throughout the coronary vasculature greatest in the LAD distribution. No evidence of thoracic aortic aneurysm or dissection. Atherosclerosis of the aortic arch and descending thoracic aorta. Mediastinum/Nodes: Right paratracheal and right hilar adenopathy identified. Largest lymph node at the right hilum measures 19 mm in short axis. Thyroid, trachea, and esophagus are grossly unremarkable. Small hiatal hernia. Lungs/Pleura: There is bilateral bronchial wall thickening, greatest in the lower lobes. Multifocal bilateral airspace disease is identified, greatest at the lung bases, most pronounced within the right lower lobe and lingula. No effusion or pneumothorax. Upper Abdomen: No acute abnormality. Musculoskeletal: No acute or destructive bony lesions. Reconstructed images demonstrate no additional findings. Review of the MIP images confirms the above findings. IMPRESSION: 1. No evidence of pulmonary embolus. 2. Bilateral bronchial wall thickening and multifocal bilateral airspace disease, greatest at the lung bases. Favor multifocal pneumonia over aspiration. 3. Likely reactive mediastinal and right hilar lymphadenopathy. 4. Aortic Atherosclerosis (ICD10-I70.0). Coronary artery atherosclerosis. Electronically Signed   By: MRanda NgoM.D.   On: 11/25/2021 20:15   DG Chest Port 1 View  Result Date: 11/25/2021 CLINICAL DATA:  Shortness of breath. EXAM: PORTABLE CHEST 1 VIEW  COMPARISON:  10/13/2021 FINDINGS: Heart size is normal. There is focal patchy opacity in the RIGHT LOWER lobe, consistent with atelectasis  or early infiltrate. LEFT lung is clear. No pulmonary edema. IMPRESSION: Suspect early RIGHT LOWER lobe infiltrate. Electronically Signed   By: Nolon Nations M.D.   On: 11/25/2021 16:41    Assessment and Plan   81yo male with PMHx including Afib, bladder cancer, HTN, HLD, CKD, hx recurrent PNA, and CAD, admitted for worsening cough, SOB, and weakness.  He checked his O2 at home and it was 88-90. He wears oxygen at night with his CPAP but not normally during the day.   Acute hypoxic respiratory failure secondary to multifocal pneumonia -- patient has history of recurrent pneumonia had similar symptoms in May 2023 -- he was found to have sats 88 to 90% on room air. Placed on oxygen 2 L. Sats more than 92%. - Continue IV Rocephin and Zithromax -- PRN cough supplements -- PR nebs and inhalers -- incentive spirometer and flutter valve -- Pro calcitonin negative. 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 appointment follow-up in December 2023 with Dr. Alice Reichert  CKD stage IIIB -- creatinine baseline 1.5-- .8  Hypertension -- continue amlodipine, hydrochlorothiazide and Avapro  History of paroxysmal a fib -- heart rate stable -- continue eliquis  History of CAD -- continue statins, aspirin  Procedures: Family communication : son at bedside Consults : none CODE STATUS: full DVT Prophylaxis eliquis Level of care: Med-Surg Status is: Inpatient Remains inpatient appropriate because: multifocal pneumonia, speech eval    TOTAL TIME TAKING CARE OF THIS PATIENT: 35 minutes.  >50% time spent on counselling and coordination of care  Note: This dictation was prepared with Dragon dictation along with smaller phrase technology. Any transcriptional errors that result from this process  are unintentional.  Fritzi Mandes M.D    [facility]   CC: Primary care physician; Wayland Denis, PA-C

## 2021-11-26 NOTE — Evaluation (Signed)
Occupational Therapy Evaluation Patient Details Name: Jason Gates. MRN: [id number] DOB: 03/26/1940 Today's Date: 11/26/2021   History of Present Illness 81yo male with PMHx including Afib, bladder cancer, HTN, HLD, CKD, hx recurrent PNA, and CAD, admitted for worsening cough, SOB, and weakness.   Clinical Impression   Pt was seen for OT evaluation this date. Prior to hospital admission, pt was independent and just recently participated in a golf tournament with his son. Pt lives with his son and grandson, on the 2nd fl bonus room and typically manges the flight of stairs without issue. Pt presents to acute OT demonstrating impaired ADL performance and functional mobility 2/2 decreased activity tolerance and cardiopulmonary status (See OT problem list). Pt currently requires supervision for ADL transfers and CGA for mobility without AD, no LOB noted. SpO2 >93% on 3L at rest, desats to 87-88% after walking lap around nurses station with 3L. Sits briefly and with VC for PLB, improves to >94% within <88mn. Pt/family deny concerns. Pt educated in ECS including activity pacing and PLB to support breath recovery following exertion. Pt would benefit from skilled OT services while hospitalized to address noted impairments and functional limitations (see below for any additional details) in order to maximize safety and independence while minimizing falls risk and caregiver burden. Do not anticipate skilled OT needs upon hospital discharge.     Recommendations for follow up therapy are one component of a multi-disciplinary discharge planning process, led by the attending physician.  Recommendations may be updated based on patient status, additional functional criteria and insurance authorization.   Follow Up Recommendations  No OT follow up    Assistance Recommended at Discharge PRN  Patient can return home with the following Assistance with cooking/housework;Help with stairs or ramp for entrance     Functional Status Assessment  Patient has had a recent decline in their functional status and demonstrates the ability to make significant improvements in function in a reasonable and predictable amount of time.  Equipment Recommendations  None recommended by OT    Recommendations for Other Services       Precautions / Restrictions Precautions Precautions: Fall Restrictions Weight Bearing Restrictions: No      Mobility Bed Mobility               General bed mobility comments: got assist from son for trunk elevation, pt endorsing he could have done it himself but had been in bed for a while    Transfers Overall transfer level: Needs assistance Equipment used: None Transfers: Sit to/from Stand Sit to Stand: Supervision                  Balance Overall balance assessment: Mild deficits observed, not formally tested                                         ADL either performed or assessed with clinical judgement   ADL Overall ADL's : Modified independent                                       General ADL Comments: Pt completes with increased time, no overt assist required     Vision         Perception     Praxis      Pertinent Vitals/Pain  Pain Assessment Pain Assessment: No/denies pain     Hand Dominance     Extremity/Trunk Assessment Upper Extremity Assessment Upper Extremity Assessment: Overall WFL for tasks assessed   Lower Extremity Assessment Lower Extremity Assessment: Defer to PT evaluation   Cervical / Trunk Assessment Cervical / Trunk Assessment: Normal   Communication     Cognition Arousal/Alertness: Awake/alert Behavior During Therapy: WFL for tasks assessed/performed Overall Cognitive Status: Within Functional Limits for tasks assessed                                       General Comments  SpO2 >93% on 3L at rest, desats to 87-88% after walking lap around nurses station  with 3L. Sits briefly and with VC for PLB, improves to >94% within <34mn    Exercises Other Exercises Other Exercises: Pt educated in falls prevention, home/routines modifications, PLB and ECS to maximize safety/indep   Shoulder Instructions      Home Living Family/patient expects to be discharged to:: Private residence Living Arrangements: Children (adult son, grandson) Available Help at Discharge: Family;Available 24 hours/day;Available PRN/intermittently   Home Access: Stairs to enter Entrance Stairs-Number of Steps: 2-3; flight to 2nd floor where pt stays   Home Layout: Two level               Home Equipment: None          Prior Functioning/Environment Prior Level of Function : Independent/Modified Independent;Driving               ADLs Comments: indep, just competed in golf tournament with son lat weekend, no falls        OT Problem List: Decreased activity tolerance;Decreased knowledge of use of DME or AE      OT Treatment/Interventions: Self-care/ADL training;Therapeutic exercise;Therapeutic activities;Energy conservation;DME and/or AE instruction;Patient/family education    OT Goals(Current goals can be found in the care plan section) Acute Rehab OT Goals Patient Stated Goal: get better OT Goal Formulation: With patient/family Time For Goal Achievement: 12/10/21 Potential to Achieve Goals: Good ADL Goals Additional ADL Goal #1: Pt will complete all aspects of bathing, combo of sitting/standing, with mod indep, SpO2 >90% on RA, PRE<5/10, 2/2 opportunities. Additional ADL Goal #2: Pt will verbalize plan to implement at least 2 learned ECS into ADL/IADL routines.  OT Frequency: Min 2X/week    Co-evaluation              AM-PAC OT "6 Clicks" Daily Activity     Outcome Measure Help from another person eating meals?: None Help from another person taking care of personal grooming?: None Help from another person toileting, which includes using  toliet, bedpan, or urinal?: A Little Help from another person bathing (including washing, rinsing, drying)?: A Little Help from another person to put on and taking off regular upper body clothing?: None Help from another person to put on and taking off regular lower body clothing?: A Little 6 Click Score: 21   End of Session Equipment Utilized During Treatment: Gait belt;Oxygen Nurse Communication: Mobility status  Activity Tolerance: Patient tolerated treatment well Patient left: in bed;with call bell/phone within reach;with bed alarm set;with family/visitor present  OT Visit Diagnosis: Other abnormalities of gait and mobility (R26.89)                Time: 0950-1009 OT Time Calculation (min): 19 min Charges:  OT General Charges $OT Visit: 1 Visit OT  Evaluation $OT Eval Low Complexity: 1 Low OT Treatments $Self Care/Home Management : 8-22 mins  Ardeth Perfect., MPH, MS, OTR/L ascom [phone] 11/26/21, 12:52 PM

## 2021-11-26 NOTE — Progress Notes (Signed)
PT Cancellation Note  Patient Details Name: Jason Gates. MRN: [id number] DOB: 15-Jul-1940   Cancelled Treatment:    Reason Eval/Treat Not Completed:  (Consult received and chart reviewed.  Patient sleeping soundly.  Family member (son) at bedside requests therapist allow patient to rest (did not sleep well overnight).  Will continue efforts next date as appropriate.)  [place] Merry H. Owens Shark, PT, DPT, NCS 11/26/21, 2:34 PM [phone]

## 2021-11-26 NOTE — Progress Notes (Signed)
   11/26/21 0930  Clinical Encounter Type  Visited With Patient and family together  Visit Type Initial  Referral From Nurse  Consult/Referral To Chaplain   Chaplain responded to [facility] consult to update advance directive. Information was given and Chaplain will follow up.

## 2021-11-26 NOTE — Plan of Care (Signed)

## 2021-11-26 NOTE — Progress Notes (Signed)
Patient refuses cpap/bipap tonight. RN at bedside. Patient will let covering RN know if he changes his mind

## 2021-11-27 ENCOUNTER — Inpatient Hospital Stay: Payer: Medicare Other

## 2021-11-27 DIAGNOSIS — R0602 Shortness of breath: Secondary | ICD-10-CM | POA: Diagnosis not present

## 2021-11-27 LAB — BASIC METABOLIC PANEL
Anion gap: 5 (ref 5–15)
BUN: 27 mg/dL — ABNORMAL HIGH (ref 8–23)
CO2: 29 mmol/L (ref 22–32)
Calcium: 8.5 mg/dL — ABNORMAL LOW (ref 8.9–10.3)
Chloride: 106 mmol/L (ref 98–111)
Creatinine, Ser: 1.68 mg/dL — ABNORMAL HIGH (ref 0.61–1.24)
GFR, Estimated: 40 mL/min — ABNORMAL LOW (ref >60)
Glucose, Bld: 105 mg/dL — ABNORMAL HIGH (ref 70–99)
Potassium: 3.8 mmol/L (ref 3.5–5.1)
Sodium: 140 mmol/L (ref 135–145)

## 2021-11-27 MED ORDER — CEFDINIR 300 MG PO CAPS
300.0000 mg | ORAL_CAPSULE | Freq: Two times a day (BID) | ORAL | Status: DC
  Administered 2021-11-28: 300 mg via ORAL
  Filled 2021-11-27: qty 1

## 2021-11-27 NOTE — Progress Notes (Signed)
   11/27/21 1300  Clinical Encounter Type  Visited With Patient  Visit Type Follow-up   Chaplain facilitated completion of AD

## 2021-11-27 NOTE — Progress Notes (Signed)
[place] [facility] [place] NAME: Jason Gates    MR#:  [id number]  DATE OF BIRTH:  Jun 30, 1940  SUBJECTIVE:  patient reports feeling much better today slept well. Son at bedside. Jason Gates cough improved with cough medicine. Able to swallow. No choking spell. Shortness of breath improving. VITALS:  Blood pressure 135/65, pulse 76, temperature 98 F (36.7 C), resp. rate 16, height '5\' 8"'$  (1.727 m), weight 107 kg, SpO2 95 %.  PHYSICAL EXAMINATION:   GENERAL:  81 y.o.-year-old patient lying in the bed with no acute distress. weak LUNGS: normal breath sounds bilaterally, no wheezing, rales, rhonchi.  CARDIOVASCULAR: S1, S2 normal. No murmurs, rubs, or gallops.  ABDOMEN: Soft, nontender, nondistended. Bowel sounds present.  EXTREMITIES: No  edema b/l.    NEUROLOGIC: nonfocal  patient is alert and awake SKIN: No obvious rash, lesion, or ulcer.   LABORATORY PANEL:  CBC Recent Labs  Lab 11/25/21 1700  WBC 9.9  HGB 14.1  HCT 43.1  PLT 201     Chemistries  Recent Labs  Lab 11/25/21 1700 11/26/21 0913 11/27/21 0407  NA 142   < > 140  K 3.5   < > 3.8  CL 107   < > 106  CO2 26   < > 29  GLUCOSE 132*   < > 105*  BUN 27*   < > 27*  CREATININE 1.84*   < > 1.68*  CALCIUM 8.8*   < > 8.5*  AST 37  --   --   ALT 28  --   --   ALKPHOS 48  --   --   BILITOT 0.8  --   --    < > = values in this interval not displayed.    Cardiac Enzymes No results for input(s): "TROPONINI" in the last 168 hours. RADIOLOGY:  DG ESOPHAGUS W SINGLE CM (SOL OR THIN BA)  Result Date: 11/27/2021 CLINICAL DATA:  Patient with complaint of chronic cough, difficulty swallowing and multiple instances of pneumonia this year. Request received for fluoroscopic esophagram study. EXAM: ESOPHAGUS/BARIUM SWALLOW/TABLET STUDY TECHNIQUE: Combined double and single contrast examination was performed using effervescent crystals, high-density barium, and thin liquid barium. This exam  was performed by Narda Rutherford, NP, and was supervised and interpreted by Kathreen Devoid, MD. FLUOROSCOPY: Radiation Exposure Index (as provided by the fluoroscopic device): 85.60 mGy Kerma COMPARISON:  Esophagram dated Jul 12, 2021 FINDINGS: Swallowing: Appears normal. No vestibular penetration or aspiration seen. Pharynx: Unremarkable. Esophagus: Normal appearance. Esophageal motility: Esophageal dysmotility with tertiary contractions causing delay in transit of barium. Hiatal Hernia: Small hiatal hernia noted. Gastroesophageal reflux: Trace gastroesophageal reflux with provocation maneuvers. Ingested 54m barium tablet: 13 mm barium tablet passed normally. Other: None. IMPRESSION: 1. Esophageal dysmotility. 2. Small hiatal hernia 3. Trace gastroesophageal reflux. Electronically Signed   By: HKathreen DevoidM.D.   On: 11/27/2021 12:43   CT Angio Chest PE W and/or Wo Contrast  Result Date: 11/25/2021 CLINICAL DATA:  Short of breath, hypertension, sleep apnea EXAM: CT ANGIOGRAPHY CHEST WITH CONTRAST TECHNIQUE: Multidetector CT imaging of the chest was performed using the standard protocol during bolus administration of intravenous contrast. Multiplanar CT image reconstructions and MIPs were obtained to evaluate the vascular anatomy. RADIATION DOSE REDUCTION: This exam was performed according to the departmental dose-optimization program which includes automated exposure control, adjustment of the mA and/or kV according to patient size and/or use of iterative reconstruction technique. CONTRAST:  731mOMNIPAQUE IOHEXOL 350 MG/ML  SOLN COMPARISON:  11/25/2021 FINDINGS: Cardiovascular: This is a technically adequate evaluation of the pulmonary vasculature. No filling defects or pulmonary emboli. The heart is unremarkable without pericardial effusion. Atherosclerosis throughout the coronary vasculature greatest in the LAD distribution. No evidence of thoracic aortic aneurysm or dissection. Atherosclerosis of the aortic arch  and descending thoracic aorta. Mediastinum/Nodes: Right paratracheal and right hilar adenopathy identified. Largest lymph node at the right hilum measures 19 mm in short axis. Thyroid, trachea, and esophagus are grossly unremarkable. Small hiatal hernia. Lungs/Pleura: There is bilateral bronchial wall thickening, greatest in the lower lobes. Multifocal bilateral airspace disease is identified, greatest at the lung bases, most pronounced within the right lower lobe and lingula. No effusion or pneumothorax. Upper Abdomen: No acute abnormality. Musculoskeletal: No acute or destructive bony lesions. Reconstructed images demonstrate no additional findings. Review of the MIP images confirms the above findings. IMPRESSION: 1. No evidence of pulmonary embolus. 2. Bilateral bronchial wall thickening and multifocal bilateral airspace disease, greatest at the lung bases. Favor multifocal pneumonia over aspiration. 3. Likely reactive mediastinal and right hilar lymphadenopathy. 4. Aortic Atherosclerosis (ICD10-I70.0). Coronary artery atherosclerosis. Electronically Signed   By: Randa Ngo M.D.   On: 11/25/2021 20:15   DG Chest Port 1 View  Result Date: 11/25/2021 CLINICAL DATA:  Shortness of breath. EXAM: PORTABLE CHEST 1 VIEW COMPARISON:  10/13/2021 FINDINGS: Heart size is normal. There is focal patchy opacity in the RIGHT LOWER lobe, consistent with atelectasis or early infiltrate. LEFT lung is clear. No pulmonary edema. IMPRESSION: Suspect early RIGHT LOWER lobe infiltrate. Electronically Signed   By: Nolon Nations M.D.   On: 11/25/2021 16:41    Assessment and Plan   81yo male with PMHx including Afib, bladder cancer, HTN, HLD, CKD, hx recurrent PNA, and CAD, admitted for worsening cough, SOB, and weakness.  He checked his O2 at home and it was 88-90. He wears oxygen at night with his CPAP but not normally during the day.   Acute hypoxic respiratory failure secondary to multifocal pneumonia--chemical  pneumonitis -- patient has history of recurrent pneumonia had similar symptoms in May 2023 -- he was found to have sats 88 to 90% on room air. Placed on oxygen 2 L. Sats more than 92%. - Continue IV Rocephin and Zithromax--change to po cefdinir -- PRN cough supplements -- PR nebs and inhalers -- incentive spirometer and flutter valve -- Pro calcitonin negative. 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 appointment follow-up in December 2023 with Dr. Alice Reichert --DG esophagus : Esophageal dysmotility with tertiary contractions causing delay in transit of barium.  Hiatal Hernia: Small hiatal hernia noted.  Gastroesophageal reflux: Trace gastroesophageal reflux with provocation maneuvers.  CKD stage IIIB -- creatinine baseline 1.5--1.8  Hypertension -- continue amlodipine, hydrochlorothiazide and Avapro  History of paroxysmal a fib -- heart rate stable -- continue eliquis  History of CAD -- continue statins, aspirin  Procedures: Family communication : son at bedside Consults : none CODE STATUS: full DVT Prophylaxis eliquis Level of care: Med-Surg Status is: Inpatient Remains inpatient appropriate because: multifocal pneumonia, speech eval   EDD 10/5  TOTAL TIME TAKING CARE OF THIS PATIENT: 35 minutes.  >50% time spent on counselling and coordination of care  Note: This dictation was prepared with Dragon dictation along with smaller phrase technology.  Any transcriptional errors that result from this process are unintentional.  Fritzi Mandes M.D    [facility]   CC: Primary care physician; Wayland Denis, PA-C

## 2021-11-27 NOTE — Evaluation (Signed)
Physical Therapy Evaluation Patient Details Name: Jason Gates. MRN: [id number] DOB: 1940-06-15 Today's Date: 11/27/2021  History of Present Illness  81yo male with PMHx including Afib, bladder cancer, HTN, HLD, CKD, hx recurrent PNA, and CAD, admitted for worsening cough, SOB, and weakness.  Clinical Impression  Pt is a pleasant 82 year old male who was admitted for ARF secondary to pneumonia.  Pt demonstrates all bed mobility/transfers/ambulation at baseline level. No AD required at this time. Pt does not require any further PT needs at this time. Pt will be dc in house and does not require follow up. RN aware. Will dc current orders. SaO2 on room air at rest = 94% SaO2 on room air while ambulating = 83% SaO2 on 2 liters of O2 while ambulating = 89%       Recommendations for follow up therapy are one component of a multi-disciplinary discharge planning process, led by the attending physician.  Recommendations may be updated based on patient status, additional functional criteria and insurance authorization.  Follow Up Recommendations No PT follow up      Assistance Recommended at Discharge None  Patient can return home with the following       Equipment Recommendations None recommended by PT  Recommendations for Other Services       Functional Status Assessment Patient has not had a recent decline in their functional status     Precautions / Restrictions Precautions Precautions: Fall Restrictions Weight Bearing Restrictions: No      Mobility  Bed Mobility Overal bed mobility: Independent             General bed mobility comments: safe technique with ease of mobility    Transfers Overall transfer level: Independent Equipment used: None Transfers: Sit to/from Stand Sit to Stand: Independent           General transfer comment: safe technique with ease of effort    Ambulation/Gait Ambulation/Gait assistance: Independent Gait Distance (Feet): 350  Feet Assistive device: None Gait Pattern/deviations: WFL(Within Functional Limits)       General Gait Details: ambulated 2 laps around RN station with ability to push own O2 tank. Ambulated with and without O2  Stairs            Wheelchair Mobility    Modified Rankin (Stroke Patients Only)       Balance Overall balance assessment: No apparent balance deficits (not formally assessed)                                           Pertinent Vitals/Pain Pain Assessment Pain Assessment: No/denies pain    Home Living Family/patient expects to be discharged to:: Private residence Living Arrangements: Children (adult son and grandson) Available Help at Discharge: Family;Available 24 hours/day;Available PRN/intermittently Type of Home: House Home Access: Stairs to enter   [employer] of Steps: 2-3; flight to 2nd floor where pt stays   Home Layout: Two level Home Equipment: None      Prior Function Prior Level of Function : Independent/Modified Independent;Driving             Mobility Comments: very active, no recent falls ADLs Comments: indep, just competed in golf tournament with son lat weekend, no falls     Hand Dominance        Extremity/Trunk Assessment   Upper Extremity Assessment Upper Extremity Assessment: Overall WFL for tasks assessed  Lower Extremity Assessment Lower Extremity Assessment: Overall WFL for tasks assessed       Communication   Communication: No difficulties  Cognition Arousal/Alertness: Awake/alert Behavior During Therapy: WFL for tasks assessed/performed Overall Cognitive Status: Within Functional Limits for tasks assessed                                          General Comments      Exercises     Assessment/Plan    PT Assessment Patient does not need any further PT services  PT Problem List         PT Treatment Interventions      PT Goals (Current goals can be  found in the Care Plan section)  Acute Rehab PT Goals Patient Stated Goal: to go home PT Goal Formulation: All assessment and education complete, DC therapy Time For Goal Achievement: 11/27/21 Potential to Achieve Goals: Good    Frequency       Co-evaluation               AM-PAC PT "6 Clicks" Mobility  Outcome Measure Help needed turning from your back to your side while in a flat bed without using bedrails?: None Help needed moving from lying on your back to sitting on the side of a flat bed without using bedrails?: None Help needed moving to and from a bed to a chair (including a wheelchair)?: None Help needed standing up from a chair using your arms (e.g., wheelchair or bedside chair)?: None Help needed to walk in hospital room?: None Help needed climbing 3-5 steps with a railing? : None 6 Click Score: 24    End of Session Equipment Utilized During Treatment: Oxygen Activity Tolerance: Patient tolerated treatment well Patient left: in bed (seated at EOB) Nurse Communication: Mobility status PT Visit Diagnosis: Difficulty in walking, not elsewhere classified (R26.2)    Time: 0981-1914 PT Time Calculation (min) (ACUTE ONLY): 11 min   Charges:   PT Evaluation $PT Eval Low Complexity: 1 Low          Jason Gates, PT, DPT, GCS [phone]   Karia Ehresman 11/27/2021, 11:12 AM

## 2021-11-27 NOTE — Plan of Care (Signed)
  Problem: Nutrition: Goal: Adequate nutrition will be maintained Outcome: Progressing   Problem: Coping: Goal: Level of anxiety will decrease Outcome: Progressing   Problem: Pain Managment: Goal: General experience of comfort will improve Outcome: Progressing   

## 2021-11-27 NOTE — Evaluation (Addendum)
Clinical/Bedside Swallow Evaluation Patient Details  Name: Jason Gates. MRN: [id number] Date of Birth: 1940-11-01  Today's Date: 11/27/2021 Time: SLP Start Time (ACUTE ONLY): 33 SLP Stop Time (ACUTE ONLY): 1330 SLP Time Calculation (min) (ACUTE ONLY): 50 min  Past Medical History:  Past Medical History:  Diagnosis Date   Atrial fibrillation ([place])    Bladder cancer ([place])    Chronic kidney disease (CKD)    Coronary artery disease    Past Surgical History:  Past Surgical History:  Procedure Laterality Date   CARDIAC SURGERY     CHOLECYSTECTOMY     CORONARY ANGIOPLASTY     "two stents in 2007"   EYE SURGERY     NASAL SEPTUM SURGERY     HPI:  Pt is a 81 y.o. male  here with SOB. Pt has a h/o recurrent PNA per report. He reports that over the past several days he has had worsening cough SOB and weakness. He has had decreased appetite. He has felt generally very weak and is having difficulty getting around due to weakness. He checked his O2 at home and it was 88-90. He wears oxygen at night with his CPAP but not normally during the day. No known sick contacts. Pt has had some nasal congestion, lightheadedness with standing.   PMH: coronary artery disease status post remote PCI to the LAD and diagonal branch that were complicated by subacute stent thrombosis, paroxysmal atrial fibrillation, PSVT, hypertension, hyperlipidemia, chronic kidney disease, obstructive sleep apnea on CPAP, stroke, bladder cancer, and GERD, and Esophageal Dilation.     Imaging: Chest CT: Bilateral bronchial wall thickening and multifocal bilateral  airspace disease, greatest at the lung bases. Favor multifocal  pneumonia over aspiration.   DG Esophagus: 1. Esophageal dysmotility.  2. Small hiatal hernia  3. Trace gastroesophageal reflux during stud (similar was seen on DG Esophagus on 06/2021). Esophageal Dilation per history.    Assessment / Plan / Recommendation  Clinical Impression   Pt seen for BSE. Pt  A/O x4; engaged easily and eager to learn information re: his issues. Son arrived. Both asked how pt's phlegm/issues could be "fixed". [place] O2 support- 1L; afebrile, WBC not elevated.    OF NOTE: Pt strongly describes s/s of REFLUX and Esophageal phase Dysmotility at home for "many years" and has been on Aciphex for his REFLUX for "many years now". Per pt/Son report and previous workups and assessment including BSE in 06/2021 at another Facility, pt demonstrated "no immediate signs of aspiration. Son reports pt has been observed to have chronic throat clearing at home that the pt didn't even notice until family mentioned it. 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 Given constellation of symptoms suggesting a primary Esophageal dysphagia.". Pt stated he has been  He endorses coughing w/ the feeling of much phlegm and coughing post meals. He has not had f/u w/ GI yet(outpt appt 01/2022 scheduled per pt).  DG Esophagus today, 11/27/2021: 1. Esophageal dysmotility.  2. Small hiatal hernia  3. Trace gastroesophageal reflux during stud (similar was seen on DG Esophagus on 06/2021).  NO LARYNGEAL PENETRATION/ASPIRATION SEEN DURING DG ESOPHAGUS STUDY.   Pt appears to present w/ adequate oropharyngeal phase swallowing function w/ No overt oropharyngeal phase dysphagia appreciated during po trials; No neuromuscular swallowing deficits appreciated. Pt appears at reduced risk for aspiration from an oropharyngeal phase standpoint following general aspiration precautions. HOWEVER, pt  has a baseline presentation of REFLUX and Esophageal phase Dysmotility -- see history of Dilation; DG Esophagus x2.  ANY Esophageal Dysmotility or Regurgitation of Reflux/Phlegm material can increase risk for aspiration of such material during Retrograde backflow thus impact Voicing and Pulmonary status - including pneumonia of REFLUX material. Pt  described h/o pneumonia in 06/2021.     Pt sat upright EOB and consumed several trials of thin liquids Via Straw, purees, and solid moistened foods w/ No immediate, overt clinical s/s of aspiration noted; clear vocal quality b/t trials, no decline in pulmonary status, no multiple swallows noted post initial pharyngeal swallow. Oral phase appeared [facility] for bolus management, mastication, and timely A-P transfer/clearing of material. Mastication appropriate for boluses. OM exam was [facility] for oral clearing; lingual/labial movements. No unilateral weakness. Speech clear.    Recommend continue a Regular diet (moistened foods CUT SMALL) w/ thin liquids. General aspiration precautions. REFLUX precautions strongly recommended to lessen risk for Regurgitation -- HOB elevated at night when sleeping. Rest Breaks during meals/oral intake to allow for Esophageal clearing.  Pt is on a PPI.    Recommend pt f/u w/ GI for assessment/management of Reflux/GERD and tx as indicated. Discussion and handouts given on REFLUX, impact of Esophageal dysmotility and REFLUX on swallowing and breathing, PPI use, behaviors to manage REFLUX, and foods/diet. MD to reconsult ST services if any new needs while admitted. NSG updated. Pt/Son appreciative of Education information. SLP Visit Diagnosis: Dysphagia, unspecified (R13.10) (Esophageal phase Dysmotility; GERD effects)    Aspiration Risk   (reduced following precautions)    Diet Recommendation   Regular diet (moistened foods CUT SMALL) w/ thin liquids. General aspiration precautions. REFLUX precautions strongly recommended to lessen risk for Regurgitation -- HOB elevated at night when sleeping. Rest Breaks during meals/oral intake to allow for Esophageal clearing.  Pt is on a PPI.  Medication Administration: Whole meds with puree    Other  Recommendations Recommended Consults: Consider GI evaluation;Consider esophageal assessment (has an appt in 01/2022) Oral Care Recommendations:  Oral care BID;Oral care before and after PO;Patient independent with oral care Other Recommendations:  (n/a)    Recommendations for follow up therapy are one component of a multi-disciplinary discharge planning process, led by the attending physician.  Recommendations may be updated based on patient status, additional functional criteria and insurance authorization.  Follow up Recommendations No SLP follow up      Assistance Recommended at Discharge None  Functional Status Assessment Patient has not had a recent decline in their functional status (no decline in swallowing ability; increased phlegm reported)  Frequency and Duration  (n/a)   (n/a)       Prognosis Prognosis for Safe Diet Advancement: Fair (-Good) Barriers to Reach Goals: Time post onset;Severity of deficits;Motivation;Behavior Barriers/Prognosis Comment: Esophageal phase Dysmotility; GERD effects      Swallow Study   General Date of Onset: 11/25/21 HPI: Pt is a 81 y.o. male  here with SOB. Pt has a h/o recurrent PNA per report. He reports that over the past several days he has had worsening cough SOB and weakness. He has had decreased appetite. He has felt generally very weak and is having difficulty getting around due to weakness. He checked his O2 at home and it was 88-90. He wears oxygen at night with his CPAP but not normally during the day. No known sick contacts. Pt has had some nasal congestion, lightheadedness with standing.  PMH: coronary artery disease status post remote PCI to the LAD and diagonal  branch that were complicated by subacute stent thrombosis, paroxysmal atrial fibrillation, PSVT, hypertension, hyperlipidemia, chronic kidney disease, obstructive sleep apnea on CPAP, stroke, bladder cancer, and GERD, and Esophageal Dilation.   Imaging: Chest CT: Bilateral bronchial wall thickening and multifocal bilateral  airspace disease, greatest at the lung bases. Favor multifocal  pneumonia over aspiration.  DG  Esophagus: 1. Esophageal dysmotility.  2. Small hiatal hernia  3. Trace gastroesophageal reflux during stud (similar was seen on DG Esophagus on 06/2021). Esophageal Dilation per history. Type of Study: Bedside Swallow Evaluation Previous Swallow Assessment: 06/2021 - regular diet Diet Prior to this Study: Regular;Thin liquids Temperature Spikes Noted: No (wbc 9.9) Respiratory Status: Nasal cannula (1L) History of Recent Intubation: No Behavior/Cognition: Alert;Cooperative;Pleasant mood Oral Cavity Assessment: Within Functional Limits Oral Care Completed by SLP: Recent completion by staff Oral Cavity - Dentition: Adequate natural dentition Vision: Functional for self-feeding Self-Feeding Abilities: Able to feed self (encouraged to sit EOB) Patient Positioning: Upright in bed (EOB) Baseline Vocal Quality: Normal Volitional Cough: Strong Volitional Swallow: Able to elicit    Oral/Motor/Sensory Function Overall Oral Motor/Sensory Function: Within functional limits   Ice Chips Ice chips: Not tested   Thin Liquid Thin Liquid: Within functional limits Presentation: Self Fed;Straw (10+ trials)    Nectar Thick Nectar Thick Liquid: Not tested   Honey Thick Honey Thick Liquid: Not tested   Puree Puree: Within functional limits Presentation: Self Fed;Spoon (8+ trials)   Solid     Solid: Within functional limits Presentation: Self Fed (4-5 trials)        Orinda Kenner, MS, CCC-SLP Speech Language Pathologist Rehab Services; [place] [phone] (ascom) Arisbeth Purrington 11/27/2021,3:57 PM

## 2021-11-27 NOTE — Progress Notes (Signed)
Occupational Therapy Treatment Patient Details Name: Jason Gates. MRN: [id number] DOB: [birth year] Today's Date: 11/27/2021   History of present illness 81yo male with PMHx including Afib, bladder cancer, HTN, HLD, CKD, hx recurrent PNA, and CAD, admitted for worsening cough, SOB, and weakness.   OT comments  Chart reviewed, pt greeted in bed reporting dizziness/nausea. BP taken 120/59 (MAP 75) HR 56, spo2 95% on 2L via [place]. BP 129/66 (MAP 85) HR in 60s while standing. Pt performed ADL amb to bathroom, simulated grooming tasks with supervision. Pt endorses feeling unwell throughout, requesting to rest. Good performance of ec techniques, PLB throughout. OT will continue to follow acutely.    Recommendations for follow up therapy are one component of a multi-disciplinary discharge planning process, led by the attending physician.  Recommendations may be updated based on patient status, additional functional criteria and insurance authorization.    Follow Up Recommendations  No OT follow up    Assistance Recommended at Discharge PRN  Patient can return home with the following  Assistance with cooking/housework;Help with stairs or ramp for entrance   Equipment Recommendations  None recommended by OT    Recommendations for Other Services      Precautions / Restrictions Precautions Precautions: Fall Restrictions Weight Bearing Restrictions: No       Mobility Bed Mobility Overal bed mobility: Independent                  Transfers Overall transfer level: Modified independent   Transfers: Sit to/from Stand Sit to Stand: Modified independent (Device/Increase time)                 Balance Overall balance assessment: No apparent balance deficits (not formally assessed)                                         ADL either performed or assessed with clinical judgement   ADL Overall ADL's : Needs assistance/impaired     Grooming: Wash/dry  hands;Sitting;Standing;Supervision/safety                   Toilet Transfer: Supervision/safety;Ambulation             General ADL Comments: good demo of PLB    Extremity/Trunk Assessment Upper Extremity Assessment Upper Extremity Assessment: Overall WFL for tasks assessed   Lower Extremity Assessment Lower Extremity Assessment: Overall WFL for tasks assessed        Vision       Perception     Praxis      Cognition Arousal/Alertness: Awake/alert Behavior During Therapy: WFL for tasks assessed/performed Overall Cognitive Status: Within Functional Limits for tasks assessed                                          Exercises      Shoulder Instructions       General Comments spo2 >95% on 2 L via [place]    Pertinent Vitals/ Pain       Pain Assessment Pain Assessment: No/denies pain  Home Living Family/patient expects to be discharged to:: Private residence Living Arrangements: Children (adult son and grandson) Available Help at Discharge: Family;Available 24 hours/day;Available PRN/intermittently Type of Home: House Home Access: Stairs to enter [employer] of Steps: 2-3; flight to 2nd floor where pt  stays   Home Layout: Two level               Home Equipment: None          Prior Functioning/Environment              Frequency  Min 2X/week        Progress Toward Goals  OT Goals(current goals can now be found in the care plan section)  Progress towards OT goals: Progressing toward goals     Plan Discharge plan remains appropriate    Co-evaluation                 AM-PAC OT "6 Clicks" Daily Activity     Outcome Measure   Help from another person eating meals?: None Help from another person taking care of personal grooming?: None Help from another person toileting, which includes using toliet, bedpan, or urinal?: None Help from another person bathing (including washing, rinsing, drying)?: A  Little Help from another person to put on and taking off regular upper body clothing?: None Help from another person to put on and taking off regular lower body clothing?: A Little 6 Click Score: 22    End of Session Equipment Utilized During Treatment: Oxygen  OT Visit Diagnosis: Other abnormalities of gait and mobility (R26.89)   Activity Tolerance Patient tolerated treatment well   Patient Left in bed;with call bell/phone within reach;with bed alarm set;with family/visitor present   Nurse Communication Mobility status        Time: 0946-1000 OT Time Calculation (min): 14 min  Charges: OT General Charges $OT Visit: 1 Visit OT Treatments $Self Care/Home Management : 8-22 mins  Shanon Payor, OTD OTR/L  11/27/21, 12:11 PM

## 2021-11-28 DIAGNOSIS — R0602 Shortness of breath: Secondary | ICD-10-CM | POA: Diagnosis not present

## 2021-11-28 MED ORDER — BENZONATATE 200 MG PO CAPS: 200.0000 mg | ORAL_CAPSULE | Freq: Three times a day (TID) | ORAL | 0 refills | Status: DC | PRN

## 2021-11-28 MED ORDER — PANTOPRAZOLE SODIUM 40 MG PO TBEC: 40.0000 mg | DELAYED_RELEASE_TABLET | Freq: Every day | ORAL | 3 refills | Status: DC

## 2021-11-28 MED ORDER — CEFDINIR 300 MG PO CAPS: 300.0000 mg | ORAL_CAPSULE | Freq: Two times a day (BID) | ORAL | 0 refills | Status: DC

## 2021-11-28 MED ORDER — CEFDINIR 300 MG PO CAPS: 300.0000 mg | ORAL_CAPSULE | Freq: Two times a day (BID) | ORAL | 0 refills | Status: AC

## 2021-11-28 MED ORDER — HYDROCOD POLI-CHLORPHE POLI ER 10-8 MG/5ML PO SUER: 5.0000 mL | Freq: Every day | ORAL | 0 refills | Status: DC

## 2021-11-28 MED ORDER — DM-GUAIFENESIN ER 30-600 MG PO TB12: 1.0000 | ORAL_TABLET | Freq: Two times a day (BID) | ORAL | 0 refills | Status: AC

## 2021-11-28 NOTE — Discharge Summary (Addendum)
Physician Discharge Summary   Patient: Jason Gates. MRN: [id number] DOB: 04-Feb-1941  Admit date:     11/25/2021  Discharge date: 11/28/21  Discharge Physician: Fritzi Mandes   PCP: Wayland Denis, PA-C   Recommendations at discharge:   F/u GI on your appt in December F/u pcp in 1-2 weeks Use your oxygen as per instructions  Discharge Diagnoses: Acute Hypoxic respiratory failure due to Chemical pneumonitis/pneumonia with suspected aspiration Esophageal dysmotility  Hospital Course:   81yo male with PMHx including Afib, bladder cancer, HTN, HLD, CKD, hx recurrent PNA, and CAD, admitted for worsening cough, SOB, and weakness.  He checked his O2 at home and it was 88-90. He wears oxygen at night with his CPAP but not normally during the day.    Acute hypoxic respiratory failure secondary to multifocal pneumonia/chemical pneumonitis -- patient has history of recurrent pneumonia had similar symptoms in May 2023 -- he was found to have sats 88 to 90% on room air. Placed on oxygen 2 L. Sats more than 92%. - Continue IV Rocephin and Zithromax--change to po cefdinir -- PRN cough supplements -- PR nebs and inhalers -- incentive spirometer and flutter valve -- Pro calcitonin negative. --CT chest shows bilateral airspcae dz --pt qualifies for home oxygen   Esophageal dysmotility with history of difficulty swallowing -- patient is high risk for aspiration -- speech therapy recs appreciated -- continue calcium channel blocker -- patient has G.I. appointment follow-up in December 2023 with Dr. Alice Reichert --DG esophagus : Esophageal dysmotility with tertiary contractions causing delay in transit of barium.  Hiatal Hernia: Small hiatal hernia noted.  Gastroesophageal reflux: Trace gastroesophageal reflux with provocation maneuvers. --cont ppi qhs   CKD stage IIIB -- creatinine baseline 1.5--1.8   Hypertension -- continue amlodipine, hydrochlorothiazide and Avapro   History of  paroxysmal a fib -- heart rate stable -- continue eliquis   History of CAD -- continue statins, aspirin   Procedures: Family communication : son at bedside Consults : none CODE STATUS: full DVT Prophylaxis eliquis  D/c home--overall improving     Diet recommendation:  Discharge Diet Orders (From admission, onward)     Start     Ordered   11/28/21 0000  Diet - low sodium heart healthy        11/28/21 0827           Cardiac diet DISCHARGE MEDICATION: Allergies as of 11/28/2021       Reactions   K Phos Mono-sod Phos Di & Mono Diarrhea   Sulfur Dioxide Hives   Elemental Sulfur Other (See Comments)   intolerance   Phosphorus Other (See Comments)   intolerance   Potassium Phosphate    Other reaction(s): Diarrhea   Amoxicillin Rash, Hives        Medication List     STOP taking these medications    RABEprazole 20 MG tablet Commonly known as: ACIPHEX       TAKE these medications    amLODipine 5 MG tablet Commonly known as: NORVASC Take 5 mg by mouth at bedtime.   apixaban 2.5 MG Tabs tablet Commonly known as: ELIQUIS Take 1 tablet (2.5 mg total) by mouth 2 (two) times daily.   aspirin 81 MG tablet Take 81 mg by mouth at bedtime.   benzonatate 200 MG capsule Commonly known as: TESSALON Take 1 capsule (200 mg total) by mouth 3 (three) times daily as needed for cough.   BLADDER 2.2 PO Take 1 tablet by mouth daily.   carboxymethylcellulose  0.5 % Soln Commonly known as: REFRESH PLUS Place 1 drop into the right eye daily as needed (dry eyes).   cefdinir 300 MG capsule Commonly known as: OMNICEF Take 1 capsule (300 mg total) by mouth every 12 (twelve) hours for 2 days.   chlorpheniramine-HYDROcodone 10-8 MG/5ML Commonly known as: TUSSIONEX Take 5 mLs by mouth at bedtime.   dextromethorphan-guaiFENesin 30-600 MG 12hr tablet Commonly known as: MUCINEX DM Take 1 tablet by mouth 2 (two) times daily for 7 days.   Diovan HCT 160-25 MG  tablet Generic drug: valsartan-hydrochlorothiazide TAKE 1 TABLET DAILY   fluticasone 50 MCG/ACT nasal spray Commonly known as: FLONASE Place 1 spray into both nostrils daily.   pantoprazole 40 MG tablet Commonly known as: PROTONIX Take 1 tablet (40 mg total) by mouth at bedtime.   prednisoLONE acetate 1 % ophthalmic suspension Commonly known as: PRED FORTE Place 1 drop into the left eye daily.   simvastatin 20 MG tablet Commonly known as: ZOCOR Take 20 mg by mouth daily.   valACYclovir 500 MG tablet Commonly known as: VALTREX Take 500 mg by mouth at bedtime.   Vitamin D 125 MCG (5000 UT) Caps Take 5,000 Units by mouth daily.               Durable Medical Equipment  (From admission, onward)           Start     Ordered   11/28/21 0824  For home use only DME oxygen  Once       Question Answer Comment  Length of Need 6 Months   Mode or (Route) Nasal cannula   Liters per Minute 2   Frequency Continuous (stationary and portable oxygen unit needed)   Oxygen conserving device Yes   Oxygen delivery system Gas      11/28/21 0823            Follow-up Information     Wayland Denis, PA-C. Schedule an appointment as soon as possible for a visit in 1 week(s).   Specialty: Physician Assistant Why: hospital f/u Contact information: [place] [address] [phone]         Nelva Bush, MD .   Specialty: Cardiology Contact information: [place] [address] [phone]                Discharge Exam: Danley Danker Weights   11/25/21 1611  Weight: 107 kg     Condition at discharge: fair  The results of significant diagnostics from this hospitalization (including imaging, microbiology, ancillary and laboratory) are listed below for reference.   Imaging Studies: DG ESOPHAGUS W SINGLE CM (SOL OR THIN BA)  Result Date: 11/27/2021 CLINICAL DATA:  Patient with complaint of  chronic cough, difficulty swallowing and multiple instances of pneumonia this year. Request received for fluoroscopic esophagram study. EXAM: ESOPHAGUS/BARIUM SWALLOW/TABLET STUDY TECHNIQUE: Combined double and single contrast examination was performed using effervescent crystals, high-density barium, and thin liquid barium. This exam was performed by Narda Rutherford, NP, and was supervised and interpreted by Kathreen Devoid, MD. FLUOROSCOPY: Radiation Exposure Index (as provided by the fluoroscopic device): 85.60 mGy Kerma COMPARISON:  Esophagram dated Jul 12, 2021 FINDINGS: Swallowing: Appears normal. No vestibular penetration or aspiration seen. Pharynx: Unremarkable. Esophagus: Normal appearance. Esophageal motility: Esophageal dysmotility with tertiary contractions causing delay in transit of barium. Hiatal Hernia: Small hiatal hernia noted. Gastroesophageal reflux: Trace gastroesophageal reflux with provocation maneuvers. Ingested 48m barium tablet: 13 mm barium  tablet passed normally. Other: None. IMPRESSION: 1. Esophageal dysmotility. 2. Small hiatal hernia 3. Trace gastroesophageal reflux. Electronically Signed   By: Kathreen Devoid M.D.   On: 11/27/2021 12:43   CT Angio Chest PE W and/or Wo Contrast  Result Date: 11/25/2021 CLINICAL DATA:  Short of breath, hypertension, sleep apnea EXAM: CT ANGIOGRAPHY CHEST WITH CONTRAST TECHNIQUE: Multidetector CT imaging of the chest was performed using the standard protocol during bolus administration of intravenous contrast. Multiplanar CT image reconstructions and MIPs were obtained to evaluate the vascular anatomy. RADIATION DOSE REDUCTION: This exam was performed according to the departmental dose-optimization program which includes automated exposure control, adjustment of the mA and/or kV according to patient size and/or use of iterative reconstruction technique. CONTRAST:  13m OMNIPAQUE IOHEXOL 350 MG/ML SOLN COMPARISON:  11/25/2021 FINDINGS: Cardiovascular: This is  a technically adequate evaluation of the pulmonary vasculature. No filling defects or pulmonary emboli. The heart is unremarkable without pericardial effusion. Atherosclerosis throughout the coronary vasculature greatest in the LAD distribution. No evidence of thoracic aortic aneurysm or dissection. Atherosclerosis of the aortic arch and descending thoracic aorta. Mediastinum/Nodes: Right paratracheal and right hilar adenopathy identified. Largest lymph node at the right hilum measures 19 mm in short axis. Thyroid, trachea, and esophagus are grossly unremarkable. Small hiatal hernia. Lungs/Pleura: There is bilateral bronchial wall thickening, greatest in the lower lobes. Multifocal bilateral airspace disease is identified, greatest at the lung bases, most pronounced within the right lower lobe and lingula. No effusion or pneumothorax. Upper Abdomen: No acute abnormality. Musculoskeletal: No acute or destructive bony lesions. Reconstructed images demonstrate no additional findings. Review of the MIP images confirms the above findings. IMPRESSION: 1. No evidence of pulmonary embolus. 2. Bilateral bronchial wall thickening and multifocal bilateral airspace disease, greatest at the lung bases. Favor multifocal pneumonia over aspiration. 3. Likely reactive mediastinal and right hilar lymphadenopathy. 4. Aortic Atherosclerosis (ICD10-I70.0). Coronary artery atherosclerosis. Electronically Signed   By: MRanda NgoM.D.   On: 11/25/2021 20:15   DG Chest Port 1 View  Result Date: 11/25/2021 CLINICAL DATA:  Shortness of breath. EXAM: PORTABLE CHEST 1 VIEW COMPARISON:  10/13/2021 FINDINGS: Heart size is normal. There is focal patchy opacity in the RIGHT LOWER lobe, consistent with atelectasis or early infiltrate. LEFT lung is clear. No pulmonary edema. IMPRESSION: Suspect early RIGHT LOWER lobe infiltrate. Electronically Signed   By: ENolon NationsM.D.   On: 11/25/2021 16:41   NM Myocar Multi W/Spect W/Wall Motion  / EF  Result Date: 10/30/2021   Low risk, probably normal pharmacologic myocardial perfusion stress test.   There is a small in size, moderate in severity, fixed apical and apical lateral defect most consistent with artifact but cannot rule out scar.   No significant ischemia is identified.   Left ventricular systolic function is normal by visual estimation and Siemens calculation (LVEF greater than 65%).   Coronary artery calcification and/or stent are noted, as well as aortic atherosclerosis.    Microbiology: Results for orders placed or performed during the hospital encounter of 11/25/21  Resp Panel by RT-PCR (Flu A&B, Covid) Anterior Nasal Swab     Status: None   Collection Time: 11/25/21  5:00 PM   Specimen: Anterior Nasal Swab  Result Value Ref Range Status   SARS Coronavirus 2 by RT PCR NEGATIVE NEGATIVE Final    Comment: (NOTE) SARS-CoV-2 target nucleic acids are NOT DETECTED.  The SARS-CoV-2 RNA is generally detectable in upper respiratory specimens during the acute phase of infection. The lowest  concentration of SARS-CoV-2 viral copies this assay can detect is 138 copies/mL. A negative result does not preclude SARS-Cov-2 infection and should not be used as the sole basis for treatment or other patient management decisions. A negative result may occur with  improper specimen collection/handling, submission of specimen other than nasopharyngeal swab, presence of viral mutation(s) within the areas targeted by this assay, and inadequate number of viral copies(<138 copies/mL). A negative result must be combined with clinical observations, patient history, and epidemiological information. The expected result is Negative.  Fact Sheet for Patients:  EntrepreneurPulse.com.au  Fact Sheet for Healthcare Providers:  IncredibleEmployment.be  This test is no t yet approved or cleared by the [place] FDA and  has been authorized for detection and/or  diagnosis of SARS-CoV-2 by FDA under an Emergency Use Authorization (EUA). This EUA will remain  in effect (meaning this test can be used) for the duration of the COVID-19 declaration under Section 564(b)(1) of the Act, 21 U.S.C.section 360bbb-3(b)(1), unless the authorization is terminated  or revoked sooner.       Influenza A by PCR NEGATIVE NEGATIVE Final   Influenza B by PCR NEGATIVE NEGATIVE Final    Comment: (NOTE) The Xpert Xpress SARS-CoV-2/FLU/RSV plus assay is intended as an aid in the diagnosis of influenza from Nasopharyngeal swab specimens and should not be used as a sole basis for treatment. Nasal washings and aspirates are unacceptable for Xpert Xpress SARS-CoV-2/FLU/RSV testing.  Fact Sheet for Patients: EntrepreneurPulse.com.au  Fact Sheet for Healthcare Providers: IncredibleEmployment.be  This test is not yet approved or cleared by the [place] FDA and has been authorized for detection and/or diagnosis of SARS-CoV-2 by FDA under an Emergency Use Authorization (EUA). This EUA will remain in effect (meaning this test can be used) for the duration of the COVID-19 declaration under Section 564(b)(1) of the Act, 21 U.S.C. section 360bbb-3(b)(1), unless the authorization is terminated or revoked.  Performed at [facility], [address] [place], [place] [address]   Blood culture (routine x 2)     Status: None (Preliminary result)   Collection Time: 11/25/21  5:00 PM   Specimen: BLOOD  Result Value Ref Range Status   Specimen Description BLOOD BLOOD LEFT ARM  Final   Special Requests   Final    BOTTLES DRAWN AEROBIC AND ANAEROBIC Blood Culture adequate volume   Culture   Final    NO GROWTH 3 DAYS Performed at [facility], [address] [place], [place] [address]    Report Status PENDING  Incomplete  Blood culture (routine x 2)     Status: None (Preliminary result)   Collection Time: 11/25/21   5:00 PM   Specimen: BLOOD  Result Value Ref Range Status   Specimen Description BLOOD BLOOD RIGHT ARM  Final   Special Requests   Final    BOTTLES DRAWN AEROBIC AND ANAEROBIC Blood Culture adequate volume   Culture   Final    NO GROWTH 3 DAYS Performed at [facility], [place], [place] [address]    Report Status PENDING  Incomplete    Labs: CBC: Recent Labs  Lab 11/25/21 1700  WBC 9.9  NEUTROABS 6.6  HGB 14.1  HCT 43.1  MCV 104.6*  PLT 681   Basic Metabolic Panel: Recent Labs  Lab 11/25/21 1700 11/26/21 0913 11/27/21 0407  NA 142 141 140  K 3.5 3.9 3.8  CL 107 105 106  CO2 '26 29 29  '$ GLUCOSE 132* 146* 105*  BUN 27* 28* 27*  CREATININE 1.84* 1.70* 1.68*  CALCIUM 8.8* 8.6* 8.5*   Liver Function Tests: Recent Labs  Lab 11/25/21 1700  AST 37  ALT 28  ALKPHOS 48  BILITOT 0.8  PROT 6.8  ALBUMIN 4.0   CBG: No results for input(s): "GLUCAP" in the last 168 hours.  Discharge time spent: greater than 30 minutes.  Signed: Fritzi Mandes, MD [facility] 11/28/2021

## 2021-11-28 NOTE — Discharge Instructions (Signed)
Follow speech therapy recs at home Use your oxygen as per instructions

## 2021-11-28 NOTE — Progress Notes (Signed)
Occupational Therapy Treatment Patient Details Name: Jason Gates. MRN: [id number] DOB: [birth year] Today's Date: 11/28/2021   History of present illness 81yo male with PMHx including Afib, bladder cancer, HTN, HLD, CKD, hx recurrent PNA, and CAD, admitted for worsening cough, SOB, and weakness.   OT comments  Chart reviewed, pt greeted in room, agreeable to OT tx session. Pt is performing ADL at baseline. Spo2 down to 87% on RA while amb in room, recovered with PLB and standing rest break. No further OT recommended. OT will sign off.    Recommendations for follow up therapy are one component of a multi-disciplinary discharge planning process, led by the attending physician.  Recommendations may be updated based on patient status, additional functional criteria and insurance authorization.    Follow Up Recommendations  No OT follow up    Assistance Recommended at Discharge PRN  Patient can return home with the following  Assistance with cooking/housework;Help with stairs or ramp for entrance   Equipment Recommendations  None recommended by OT    Recommendations for Other Services      Precautions / Restrictions Precautions Precautions: Fall Restrictions Weight Bearing Restrictions: No       Mobility Bed Mobility Overal bed mobility: Independent                  Transfers Overall transfer level: Modified independent Equipment used: None Transfers: Sit to/from Stand Sit to Stand: Modified independent (Device/Increase time) (from multiple surfaces)                 Balance Overall balance assessment: No apparent balance deficits (not formally assessed)                                         ADL either performed or assessed with clinical judgement   ADL Overall ADL's : At baseline                                            Extremity/Trunk Assessment              Vision       Perception     Praxis       Cognition Arousal/Alertness: Awake/alert Behavior During Therapy: WFL for tasks assessed/performed Overall Cognitive Status: Within Functional Limits for tasks assessed                                          Exercises      Shoulder Instructions       General Comments spo2 down to 87% on RA after amb in room, recovered >90% with rest and PLB    Pertinent Vitals/ Pain       Pain Assessment Pain Assessment: No/denies pain  Home Living                                          Prior Functioning/Environment              Frequency  Min 2X/week        Progress Toward Goals  OT Goals(current goals can  now be found in the care plan section)  Progress towards OT goals: Progressing toward goals     Plan Discharge plan remains appropriate    Co-evaluation                 AM-PAC OT "6 Clicks" Daily Activity     Outcome Measure   Help from another person eating meals?: None Help from another person taking care of personal grooming?: None Help from another person toileting, which includes using toliet, bedpan, or urinal?: None   Help from another person to put on and taking off regular upper body clothing?: None   6 Click Score: 16    End of Session    OT Visit Diagnosis: Other abnormalities of gait and mobility (R26.89)   Activity Tolerance Patient tolerated treatment well   Patient Left with call bell/phone within reach;with family/visitor present;in chair;Other (comment) (case management present)   Nurse Communication Mobility status        Time: 0272-5366 OT Time Calculation (min): 10 min  Charges: OT General Charges $OT Visit: 1 Visit OT Treatments $Therapeutic Activity: 8-22 mins Shanon Payor, OTD OTR/L  11/28/21, 12:25 PM

## 2021-11-30 LAB — CULTURE, BLOOD (ROUTINE X 2)
Culture: NO GROWTH
Culture: NO GROWTH
Special Requests: ADEQUATE
Special Requests: ADEQUATE

## 2022-01-22 ENCOUNTER — Other Ambulatory Visit: Payer: Self-pay | Admitting: Internal Medicine

## 2022-01-23 ENCOUNTER — Telehealth: Payer: Self-pay | Admitting: *Deleted

## 2022-01-23 ENCOUNTER — Telehealth: Payer: Self-pay | Admitting: Internal Medicine

## 2022-01-23 ENCOUNTER — Other Ambulatory Visit: Payer: Self-pay | Admitting: *Deleted

## 2022-01-23 MED ORDER — PANTOPRAZOLE SODIUM 40 MG PO TBEC: 40.0000 mg | DELAYED_RELEASE_TABLET | Freq: Every day | ORAL | 3 refills | Status: AC

## 2022-01-23 NOTE — Telephone Encounter (Signed)
Pt is set up for telephone clearance. Med rec and consent done.

## 2022-01-23 NOTE — Telephone Encounter (Signed)
Pt takes Eliquis, not Xarelto, for her afib.    Procedure: Colonoscopy and Upper Endoscopy  Date of procedure: 04/21/22  CHA2DS2-VASc Score = 6  This indicates a 9.7% annual risk of stroke. The patient's score is based upon: CHF History: 0 HTN History: 1 Diabetes History: 0 Stroke History: 2 Vascular Disease History: 1 Age Score: 2 Gender Score: 0   CrCl 8m/min Platelet count 194K  Per office protocol, patient can hold Eliquis for 1 day prior to procedure. He should resume as soon as safely possible after given history of afib and stroke.  **This guidance is not considered finalized until pre-operative APP has relayed final recommendations.**

## 2022-01-23 NOTE — Telephone Encounter (Signed)
   Pre-operative Risk Assessment    Patient Name: Jason Gates.  DOB: May 17, 1940 MRN: [id number]      Request for Surgical Clearance    Procedure:   Colonoscopy and Upper Endoscopy  Date of Surgery:  Clearance 04/21/22                                 Surgeon:  Dr. Haig Prophet Surgeon's Group or Practice Name:  [facility]  Phone number:  [phone]  Fax number:  [phone]   Type of Clearance Requested:   - Pharmacy:  Hold Rivaroxaban (Xarelto)     Type of Anesthesia:   Propofol   Additional requests/questions:  Caller stated they will need to know how many days to hold the patient's medication.  Caller stated clearance previously requested in September but surgery now postponed to February.  Signed, Heloise Beecham   01/23/2022, 9:00 AM

## 2022-01-23 NOTE — Telephone Encounter (Signed)
   Name: Jason Gates.  DOB: 07/14/1940  MRN: [id number]  Primary Cardiologist: Nelva Bush, MD   Preoperative team, please contact this patient and set up a phone call appointment for further preoperative risk assessment. Please obtain consent and complete medication review. Thank you for your help.  I confirm that guidance regarding antiplatelet and oral anticoagulation therapy has been completed and, if necessary, noted below.  Per Pharm D:  Procedure: Colonoscopy and Upper Endoscopy  Date of procedure: 04/21/22   CHA2DS2-VASc Score = 6  This indicates a 9.7% annual risk of stroke. The patient's score is based upon: CHF History: 0 HTN History: 1 Diabetes History: 0 Stroke History: 2 Vascular Disease History: 1 Age Score: 2 Gender Score: 0   CrCl 23m/min Platelet count 194K   Per office protocol, patient can hold Eliquis for 1 day prior to procedure. He should resume as soon as safely possible after given history of afib and stroke.   DMayra Reel NP 01/23/2022, 3:51 PM [place]

## 2022-01-23 NOTE — Telephone Encounter (Signed)
  Patient Consent for Virtual Visit            Jason Gates. has provided verbal consent on 01/23/2022 for a virtual visit (video or telephone).   CONSENT FOR VIRTUAL VISIT FOR:  Jason Gates.  By participating in this virtual visit I agree to the following:  I hereby voluntarily request, consent and authorize [facility] and its employed or contracted physicians, physician assistants, nurse practitioners or other licensed health care professionals (the Practitioner), to provide me with telemedicine health care services (the "Services") as deemed necessary by the treating Practitioner. I acknowledge and consent to receive the Services by the Practitioner via telemedicine. I understand that the telemedicine visit will involve communicating with the Practitioner through live audiovisual communication technology and the disclosure of certain medical information by electronic transmission. I acknowledge that I have been given the opportunity to request an in-person assessment or other available alternative prior to the telemedicine visit and am voluntarily participating in the telemedicine visit.  I understand that I have the right to withhold or withdraw my consent to the use of telemedicine in the course of my care at any time, without affecting my right to future care or treatment, and that the Practitioner or I may terminate the telemedicine visit at any time. I understand that I have the right to inspect all information obtained and/or recorded in the course of the telemedicine visit and may receive copies of available information for a reasonable fee.  I understand that some of the potential risks of receiving the Services via telemedicine include:  Delay or interruption in medical evaluation due to technological equipment failure or disruption; Information transmitted may not be sufficient (e.g. poor resolution of images) to allow for appropriate medical decision making by  the Practitioner; and/or  In rare instances, security protocols could fail, causing a breach of personal health information.  Furthermore, I acknowledge that it is my responsibility to provide information about my medical history, conditions and care that is complete and accurate to the best of my ability. I acknowledge that Practitioner's advice, recommendations, and/or decision may be based on factors not within their control, such as incomplete or inaccurate data provided by me or distortions of diagnostic images or specimens that may result from electronic transmissions. I understand that the practice of medicine is not an exact science and that Practitioner makes no warranties or guarantees regarding treatment outcomes. I acknowledge that a copy of this consent can be made available to me via my patient portal ([facility]), or I can request a printed copy by calling the office of [facility].    I understand that my insurance will be billed for this visit.   I have read or had this consent read to me. I understand the contents of this consent, which adequately explains the benefits and risks of the Services being provided via telemedicine.  I have been provided ample opportunity to ask questions regarding this consent and the Services and have had my questions answered to my satisfaction. I give my informed consent for the services to be provided through the use of telemedicine in my medical care

## 2022-02-07 ENCOUNTER — Ambulatory Visit: Payer: Medicare Other | Attending: Cardiovascular Disease | Admitting: Nurse Practitioner

## 2022-02-07 ENCOUNTER — Encounter: Payer: Self-pay | Admitting: Nurse Practitioner

## 2022-02-07 DIAGNOSIS — Z01810 Encounter for preprocedural cardiovascular examination: Secondary | ICD-10-CM | POA: Diagnosis not present

## 2022-02-07 NOTE — Progress Notes (Signed)
Virtual Visit via Telephone Note   Because of Jason SANGER Jr.'s co-morbid illnesses, he is at least at moderate risk for complications without adequate follow up.  This format is felt to be most appropriate for this patient at this time.  The patient did not have access to video technology/had technical difficulties with video requiring transitioning to audio format only (telephone).  All issues noted in this document were discussed and addressed.  No physical exam could be performed with this format.  Please refer to the patient's chart for his consent to telehealth for [facility].  Evaluation Performed:  Preoperative cardiovascular risk assessment _____________   Date:  02/07/2022   Patient ID:  Jason Gates., DOB [birth year], MRN [id number] Patient Location:  Home Provider location:   Office  Primary Care Provider:  Wayland Denis, PA-C Primary Cardiologist:  Nelva Bush, MD  Chief Complaint / Patient Profile   81 y.o. y/o male with a h/o CAD s/p remote PCI to LAD and diagonal branch complicated by subacute stent thrombosis, PAF, PSVT, HTN, HLD, CKD, OSA on CPAP, stroke who is pending colonoscopy and EGD and presents today for telephonic preoperative cardiovascular risk assessment.  History of Present Illness    Jason Gates. is a 81 y.o. male who presents via audio/video conferencing for a telehealth visit today.  Pt was last seen in cardiology clinic on 11/08/21 by Gerrie Nordmann, NP.  At that time Jason Gates. was doing well.  The patient is now pending procedure as outlined above. Since his last visit, he  denies chest pain, shortness of breath, lower extremity edema, fatigue, palpitations, melena, hematuria, hemoptysis, diaphoresis, weakness, presyncope, syncope, orthopnea, and PND. He lives on the [address]nd level and is frequently up and down the stairs multiple times per day as well as playing golf several times per week.   Past Medical History     Past Medical History:  Diagnosis Date   Atrial fibrillation ([place])    Bladder cancer ([place])    Chronic kidney disease (CKD)    Coronary artery disease    Past Surgical History:  Procedure Laterality Date   CARDIAC SURGERY     CHOLECYSTECTOMY     CORONARY ANGIOPLASTY     "two stents in 2007"   EYE SURGERY     NASAL SEPTUM SURGERY      Allergies  Allergies  Allergen Reactions   K Phos Mono-Sod Phos Di & Mono Diarrhea   Sulfur Dioxide Hives   Elemental Sulfur Other (See Comments)    intolerance   Phosphorus Other (See Comments)    intolerance   Potassium Phosphate     Other reaction(s): Diarrhea   Amoxicillin Rash and Hives    Home Medications    Prior to Admission medications   Medication Sig Start Date End Date Taking? Authorizing Provider  amLODipine (NORVASC) 5 MG tablet Take 5 mg by mouth at bedtime. 04/26/15   [provider]  apixaban (ELIQUIS) 2.5 MG TABS tablet Take 1 tablet (2.5 mg total) by mouth 2 (two) times daily. 08/16/21   End, Harrell Gave, MD  aspirin 81 MG tablet Take 81 mg by mouth at bedtime.    [provider]  carboxymethylcellulose (REFRESH PLUS) 0.5 % SOLN Place 1 drop into the right eye daily as needed (dry eyes).    [provider]  Cholecalciferol (VITAMIN D) 125 MCG (5000 UT) CAPS Take 5,000 Units by mouth daily.    [provider]  fluticasone (FLONASE) 50 MCG/ACT nasal spray Place 1 spray into both nostrils daily.    [provider]  Nutritional Supplements (BLADDER 2.2 PO) Take 1 tablet by mouth daily.    [provider]  pantoprazole (PROTONIX) 40 MG tablet Take 1 tablet (40 mg total) by mouth at bedtime. 01/23/22 01/24/23  End, Harrell Gave, MD  prednisoLONE acetate (PRED FORTE) 1 % ophthalmic suspension Place 1 drop into the left eye daily.    [provider]  simvastatin (ZOCOR) 20 MG tablet Take 20 mg by mouth daily.    [provider]  valACYclovir (VALTREX) 500 MG  tablet Take 500 mg by mouth at bedtime. 06/12/15   [provider]  valsartan-hydrochlorothiazide (DIOVAN HCT) 160-25 MG tablet TAKE 1 TABLET DAILY 01/22/22   End, Harrell Gave, MD    Physical Exam    Vital Signs:  Jason Gates. does not have vital signs available for review today.  Given telephonic nature of communication, physical exam is limited. AAOx3. NAD. Normal affect.  Speech and respirations are unlabored.  Accessory Clinical Findings    None  Assessment & Plan    1.  Preoperative Cardiovascular Risk Assessment: The patient is doing well from a cardiac perspective. Therefore, based on ACC/AHA guidelines, the patient would be at acceptable risk for the planned procedure without further cardiovascular testing.  According to the Revised Cardiac Risk Index (RCRI), his Perioperative Risk of Major Cardiac Event is (%): 6.6. His Functional Capacity in METs is: 6.05 according to the Duke Activity Status Index (DASI).   The patient was advised that if he develops new symptoms prior to surgery to contact our office to arrange for a follow-up visit, and he verbalized understanding.  Per office protocol, patient can hold Eliquis for 1 day prior to procedure. He should resume as soon as safely possible after given history of afib and stroke.   A copy of this note will be routed to requesting surgeon.  Time:   Today, I have spent 8 minutes with the patient with telehealth technology discussing medical history, symptoms, and management plan.     Emmaline Life, NP-C  02/07/2022, 2:44 PM [address], [address] Office [phone] Fax [phone]

## 2022-02-12 NOTE — Telephone Encounter (Signed)
Our received clearance back that we have provided clearance on for this pt. There are notes that were hand written on the notes back to [place]. Notes state: Procedure cannot be done if patient holds Eliquis 1 day prior to procedure. Please calculate patient's clearance per preoperative clearance form. Please review and advise.   I d/w Christen Bame, NP who cleared the pt and had also provided the Eliquis hold time per our pharm-d Megan Supple RPH-CPP. Per Fuller [place], Pharm-d recommendations still stand for 1 day hold of Eliquis for this pt.   Per Pharm-d:  day hold for colonoscopy since it's a lower bleed risk procedure, we usually recommend that shorter hold time for higher risk pts with hx of stroke.   I was asked to call the requesting office to advise of the recommendations. I have left a message for Pleas Koch to call back to go over the recommendations from the Pharm-d.   In the mean time I will also fax these notes to requesting office

## 2022-02-27 NOTE — Telephone Encounter (Signed)
I was out of the office yesterday and when I came back to the office there was a vm from requesting office in regard to the clearance for this pt asking for updated notes to be faxed to them at [phone]. Please see previous notes from NP and Pharm-d in regard to blood thinner.   I will re-fax these notes again to the requesting office.

## 2022-03-03 NOTE — Telephone Encounter (Signed)
Sorry for the late response. I'm happy to speak with the Pharm-d and show them our GI guidelines but it's either hold the eliquis for 3 days or perform a lovenox bridge. I really don't see an indication for a lovenox bridge (mechanical mitral valve) but if he's felt to be high risk then maybe they could provide a lovenox bridge and hold lovenox for 24 hours prior to procedure.  Dr. Haig Prophet

## 2022-03-04 NOTE — Telephone Encounter (Signed)
Dr. Haig Prophet,  Would a 2 day hold be acceptable for you? Patient is felt to be higher risk due to his hx of stroke.  Dr. Saunders Revel can you please weight in.

## 2022-03-04 NOTE — Telephone Encounter (Signed)
Thanks for the response. Our guidelines state 3 days for creatinine clearance of 35 with apixaban. Please see ASGE "The Management of antithrombotic agents for patients undergoing GI endoscopy". Aspirin 81 mg is perfectly fine to continue. Most of the time they are able to resume the DOAC the same day of the procedure. Let me know if there are any questions.  Thanks,  Tomasita Crumble

## 2022-03-04 NOTE — Telephone Encounter (Signed)
Standard protocol would be to hold apixaban for 2 days before a "high bleeding risk procedure" for a creatinine clearance greater than 25 ml/min based on the 2023 ACC/AHA/ACCP/HRS Guideline for Diagnosis and Management of Atrial Fibrillation (most recent GFR 35 in 12/2021).  As the patient has not had a recent stroke or mechanical valve, bridging with enoxaparin is not necessary.  If Dr. Haig Prophet feels that cessation of apixaban 3 days before procedure is necessary, I will defer to his discretion and discussion with the patient regarding risks and benefits of this strategy.  Given history of remote LAD/diagonal stenting with subacute stent thrombosis, I would advocate for continuation of aspirin 81 mg daily during the perioperative period if possible.  Apixaban should be restarted as soon as it is felt safe to do so by Dr. Haig Prophet after the procedure.  Nelva Bush, MD [facility]

## 2022-04-18 ENCOUNTER — Encounter: Payer: Self-pay | Admitting: *Deleted

## 2022-04-21 ENCOUNTER — Ambulatory Visit
Admission: RE | Admit: 2022-04-21 | Discharge: 2022-04-21 | Disposition: A | Discharge status: Home / Self Care | Payer: Medicare Other | Source: Ambulatory Visit | Attending: Gastroenterology | Admitting: Gastroenterology

## 2022-04-21 ENCOUNTER — Encounter
Admission: RE | Disposition: A | Discharge status: Home / Self Care | Payer: Self-pay | Source: Ambulatory Visit | Attending: Gastroenterology

## 2022-04-21 ENCOUNTER — Ambulatory Visit: Payer: Medicare Other | Admitting: Anesthesiology

## 2022-04-21 DIAGNOSIS — Z6835 Body mass index (BMI) 35.0-35.9, adult: Secondary | ICD-10-CM | POA: Insufficient documentation

## 2022-04-21 DIAGNOSIS — I2510 Atherosclerotic heart disease of native coronary artery without angina pectoris: Secondary | ICD-10-CM | POA: Diagnosis not present

## 2022-04-21 DIAGNOSIS — I129 Hypertensive chronic kidney disease with stage 1 through stage 4 chronic kidney disease, or unspecified chronic kidney disease: Secondary | ICD-10-CM | POA: Insufficient documentation

## 2022-04-21 DIAGNOSIS — K640 First degree hemorrhoids: Secondary | ICD-10-CM | POA: Diagnosis not present

## 2022-04-21 DIAGNOSIS — Z7901 Long term (current) use of anticoagulants: Secondary | ICD-10-CM | POA: Diagnosis not present

## 2022-04-21 DIAGNOSIS — K219 Gastro-esophageal reflux disease without esophagitis: Secondary | ICD-10-CM | POA: Insufficient documentation

## 2022-04-21 DIAGNOSIS — G4733 Obstructive sleep apnea (adult) (pediatric): Secondary | ICD-10-CM | POA: Insufficient documentation

## 2022-04-21 DIAGNOSIS — R1310 Dysphagia, unspecified: Secondary | ICD-10-CM | POA: Diagnosis present

## 2022-04-21 DIAGNOSIS — K449 Diaphragmatic hernia without obstruction or gangrene: Secondary | ICD-10-CM | POA: Diagnosis not present

## 2022-04-21 DIAGNOSIS — Z8551 Personal history of malignant neoplasm of bladder: Secondary | ICD-10-CM | POA: Diagnosis not present

## 2022-04-21 DIAGNOSIS — E669 Obesity, unspecified: Secondary | ICD-10-CM | POA: Diagnosis not present

## 2022-04-21 DIAGNOSIS — Z1211 Encounter for screening for malignant neoplasm of colon: Secondary | ICD-10-CM | POA: Insufficient documentation

## 2022-04-21 DIAGNOSIS — Z9049 Acquired absence of other specified parts of digestive tract: Secondary | ICD-10-CM | POA: Diagnosis not present

## 2022-04-21 DIAGNOSIS — K222 Esophageal obstruction: Secondary | ICD-10-CM | POA: Diagnosis not present

## 2022-04-21 DIAGNOSIS — Z87891 Personal history of nicotine dependence: Secondary | ICD-10-CM | POA: Diagnosis not present

## 2022-04-21 DIAGNOSIS — Z86010 Personal history of colonic polyps: Secondary | ICD-10-CM | POA: Diagnosis not present

## 2022-04-21 DIAGNOSIS — Z955 Presence of coronary angioplasty implant and graft: Secondary | ICD-10-CM | POA: Diagnosis not present

## 2022-04-21 DIAGNOSIS — K317 Polyp of stomach and duodenum: Secondary | ICD-10-CM | POA: Insufficient documentation

## 2022-04-21 DIAGNOSIS — N189 Chronic kidney disease, unspecified: Secondary | ICD-10-CM | POA: Diagnosis not present

## 2022-04-21 DIAGNOSIS — Z8701 Personal history of pneumonia (recurrent): Secondary | ICD-10-CM | POA: Diagnosis not present

## 2022-04-21 HISTORY — PX: ESOPHAGOGASTRODUODENOSCOPY (EGD) WITH PROPOFOL: SHX5813

## 2022-04-21 HISTORY — PX: COLONOSCOPY WITH PROPOFOL: SHX5780

## 2022-04-21 SURGERY — COLONOSCOPY WITH PROPOFOL
Anesthesia: General

## 2022-04-21 MED ORDER — STERILE WATER FOR IRRIGATION IR SOLN
Status: DC | PRN
  Administered 2022-04-21: 120 mL
  Administered 2022-04-21: 60 mL

## 2022-04-21 MED ORDER — PROPOFOL 10 MG/ML IV BOLUS
INTRAVENOUS | Status: DC | PRN
  Administered 2022-04-21: 80 mg via INTRAVENOUS

## 2022-04-21 MED ORDER — PHENYLEPHRINE HCL (PRESSORS) 10 MG/ML IV SOLN
INTRAVENOUS | Status: DC | PRN
  Administered 2022-04-21: 160 ug via INTRAVENOUS

## 2022-04-21 MED ORDER — LIDOCAINE HCL (CARDIAC) PF 100 MG/5ML IV SOSY
PREFILLED_SYRINGE | INTRAVENOUS | Status: DC | PRN
  Administered 2022-04-21: 50 mg via INTRAVENOUS

## 2022-04-21 MED ORDER — SODIUM CHLORIDE 0.9 % IV SOLN
INTRAVENOUS | Status: DC
  Administered 2022-04-21: 20 mL/h via INTRAVENOUS

## 2022-04-21 MED ORDER — DEXMEDETOMIDINE HCL IN NACL 80 MCG/20ML IV SOLN
INTRAVENOUS | Status: DC | PRN
  Administered 2022-04-21: 4 ug via BUCCAL

## 2022-04-21 MED ORDER — PROPOFOL 500 MG/50ML IV EMUL
INTRAVENOUS | Status: DC | PRN
  Administered 2022-04-21: 175 ug/kg/min via INTRAVENOUS

## 2022-04-21 NOTE — Anesthesia Preprocedure Evaluation (Addendum)
Anesthesia Evaluation  Patient identified by MRN, date of birth, ID band Patient awake    Reviewed: Allergy & Precautions, NPO status , Patient's Chart, lab work & pertinent test results  History of Anesthesia Complications Negative for: history of anesthetic complications  Airway Mallampati: IV   Neck ROM: Full    Dental  (+) Upper Dentures, Missing   Pulmonary sleep apnea and Continuous Positive Airway Pressure Ventilation , former smoker (quit 1976)   Pulmonary exam normal breath sounds clear to auscultation       Cardiovascular hypertension, + CAD (s/p stents)  + dysrhythmias (a fib on Eliquis)  Rhythm:Irregular Rate:Normal  ECG 11/25/21: a fib  Myocardial perfusion 10/29/21:    Low risk, probably normal pharmacologic myocardial perfusion stress test.   There is a small in size, moderate in severity, fixed apical and apical lateral defect most consistent with artifact but cannot rule out scar.   No significant ischemia is identified.   Left ventricular systolic function is normal by visual estimation and Siemens calculation (LVEF greater than 65%).   Coronary artery calcification and/or stent are noted, as well as aortic atherosclerosis.    Neuro/Psych negative neurological ROS     GI/Hepatic negative GI ROS,,,  Endo/Other  Obesity   Renal/GU Renal disease (CKD)   Bladder CA    Musculoskeletal   Abdominal   Peds  Hematology negative hematology ROS (+)   Anesthesia Other Findings Cardiology note 02/07/22:  1.  Preoperative Cardiovascular Risk Assessment: The patient is doing well from a cardiac perspective. Therefore, based on ACC/AHA guidelines, the patient would be at acceptable risk for the planned procedure without further cardiovascular testing.  According to the Revised Cardiac Risk Index (RCRI), his Perioperative Risk of Major Cardiac Event is (%): 6.6. His Functional Capacity in METs is: 6.05  according to the Duke Activity Status Index (DASI).   Reproductive/Obstetrics                              Anesthesia Physical Anesthesia Plan  ASA: 3  Anesthesia Plan: General   Post-op Pain Management:    Induction: Intravenous  PONV Risk Score and Plan: 2 and Propofol infusion, TIVA and Treatment may vary due to age or medical condition  Airway Management Planned: Natural Airway  Additional Equipment:   Intra-op Plan:   Post-operative Plan:   Informed Consent: I have reviewed the patients History and Physical, chart, labs and discussed the procedure including the risks, benefits and alternatives for the proposed anesthesia with the patient or authorized representative who has indicated his/her understanding and acceptance.       Plan Discussed with: CRNA  Anesthesia Plan Comments: (LMA/GETA backup discussed.  Patient consented for risks of anesthesia including but not limited to:  - adverse reactions to medications - damage to eyes, teeth, lips or other oral mucosa - nerve damage due to positioning  - sore throat or hoarseness - damage to heart, brain, nerves, lungs, other parts of body or loss of life  Informed patient about role of CRNA in peri- and intra-operative care.  Patient voiced understanding.)         Anesthesia Quick Evaluation

## 2022-04-21 NOTE — Op Note (Signed)
[facility] Gastroenterology Patient Name: Jason Gates Procedure Date: 04/21/2022 8:11 AM MRN: [id number] Account #: [id number] Date of Birth: 01-27-1941 Admit Type: Outpatient Age: 82 Room: [facility] ENDO ROOM 3 Gender: Male Note Status: Finalized Instrument Name: Upper Endoscope [id number] Procedure:             Upper GI endoscopy Indications:           Dysphagia, Gastro-esophageal reflux disease Providers:             Andrey Farmer MD, MD Referring MD:          No Local Md, MD (Referring MD) Medicines:             Monitored Anesthesia Care Complications:         No immediate complications. Estimated blood loss:                         Minimal. Procedure:             Pre-Anesthesia Assessment:                        - Prior to the procedure, a History and Physical was                         performed, and patient medications and allergies were                         reviewed. The patient is competent. The risks and                         benefits of the procedure and the sedation options and                         risks were discussed with the patient. All questions                         were answered and informed consent was obtained.                         Patient identification and proposed procedure were                         verified by the physician, the nurse, the                         anesthesiologist, the anesthetist and the technician                         in the endoscopy suite. Mental Status Examination:                         alert and oriented. Airway Examination: normal                         oropharyngeal airway and neck mobility. Respiratory                         Examination: clear to auscultation. CV Examination:  normal. Prophylactic Antibiotics: The patient does not                         require prophylactic antibiotics. Prior                         Anticoagulants: The patient has taken Eliquis                          (apixaban), last dose was 3 days prior to procedure.                         ASA Grade Assessment: III - A patient with severe                         systemic disease. After reviewing the risks and                         benefits, the patient was deemed in satisfactory                         condition to undergo the procedure. The anesthesia                         plan was to use monitored anesthesia care (MAC).                         Immediately prior to administration of medications,                         the patient was re-assessed for adequacy to receive                         sedatives. The heart rate, respiratory rate, oxygen                         saturations, blood pressure, adequacy of pulmonary                         ventilation, and response to care were monitored                         throughout the procedure. The physical status of the                         patient was re-assessed after the procedure.                        After obtaining informed consent, the endoscope was                         passed under direct vision. Throughout the procedure,                         the patient's blood pressure, pulse, and oxygen                         saturations were monitored continuously. The Endoscope  was introduced through the mouth, and advanced to the                         second part of duodenum. The upper GI endoscopy was                         somewhat difficult due to the patient's oxygen                         desaturation. Successful completion of the procedure                         was aided by managing the patient's medical                         instability. The patient tolerated the procedure well. Findings:      A non-obstructing Schatzki ring was found in the lower third of the       esophagus. A TTS dilator was passed through the scope. Dilation with a       15-16.5-18 mm balloon dilator was performed to  18 mm. The dilation site       was examined and showed mild mucosal disruption. Estimated blood loss       was minimal.      A small hiatal hernia was present.      The esophagus and gastroesophageal junction were examined with white       light. There was no visual evidence of Barrett's esophagus although a       thorough exam was difficult due to respiratory issues. Focus was on       dilation.      Multiple small sessile fundic gland polyps with no stigmata of recent       bleeding were found in the gastric fundus and in the gastric body.      The exam of the stomach was otherwise normal.      The examined duodenum was normal. Impression:            - Non-obstructing Schatzki ring. Dilated.                        - Small hiatal hernia.                        - There is no endoscopic evidence of Barrett's                         esophagus.                        - Multiple fundic gland polyps.                        - Normal examined duodenum.                        - No specimens collected. Recommendation:        - Discharge patient to home.                        - Resume previous diet.                        -  Continue present medications.                        - Return to referring physician as previously                         scheduled. Procedure Code(s):     --- Professional ---                        [phone], Esophagogastroduodenoscopy, flexible,                         transoral; with transendoscopic balloon dilation of                         esophagus (less than 30 mm diameter) Diagnosis Code(s):     --- Professional ---                        K22.2, Esophageal obstruction                        K44.9, Diaphragmatic hernia without obstruction or                         gangrene                        K31.7, Polyp of stomach and duodenum                        R13.10, Dysphagia, unspecified                        K21.9, Gastro-esophageal reflux disease without                          esophagitis CPT copyright 2022 American Medical Association. All rights reserved. The codes documented in this report are preliminary and upon coder review may  be revised to meet current compliance requirements. Andrey Farmer MD, MD 04/21/2022 8:48:41 AM Number of Addenda: 0 Note Initiated On: 04/21/2022 8:11 AM Estimated Blood Loss:  Estimated blood loss was minimal.      [facility]

## 2022-04-21 NOTE — Anesthesia Postprocedure Evaluation (Signed)
Anesthesia Post Note  Patient: Jason Gates.  Procedure(s) Performed: COLONOSCOPY WITH PROPOFOL ESOPHAGOGASTRODUODENOSCOPY (EGD) WITH PROPOFOL  Patient location during evaluation: PACU Anesthesia Type: General Level of consciousness: awake and alert, oriented and patient cooperative Pain management: pain level controlled Vital Signs Assessment: post-procedure vital signs reviewed and stable Respiratory status: spontaneous breathing, nonlabored ventilation and respiratory function stable Cardiovascular status: blood pressure returned to baseline and stable Postop Assessment: adequate PO intake Anesthetic complications: no   No notable events documented.   Last Vitals:  Vitals:   04/21/22 0848 04/21/22 0905  BP: (!) 89/47 (!) 145/75  Pulse: 72   Resp: 17   Temp:    SpO2: 91%     Last Pain:  Vitals:   04/21/22 0905  TempSrc:   PainSc: 0-No pain                 Darrin Nipper

## 2022-04-21 NOTE — Op Note (Signed)
[facility] Gastroenterology Patient Name: Jason Gates Procedure Date: 04/21/2022 8:10 AM MRN: [id number] Account #: [id number] Date of Birth: 1940-09-10 Admit Type: Outpatient Age: 82 Room: [facility] ENDO ROOM 3 Gender: Male Note Status: Finalized Instrument Name: Jasper Riling [id number] Procedure:             Colonoscopy Indications:           Surveillance: Personal history of colonic polyps                         (unknown histology) on last colonoscopy more than 5                         years ago Providers:             Andrey Farmer MD, MD Referring MD:          No Local Md, MD (Referring MD) Medicines:             Monitored Anesthesia Care Complications:         No immediate complications. Procedure:             Pre-Anesthesia Assessment:                        - Prior to the procedure, a History and Physical was                         performed, and patient medications and allergies were                         reviewed. The patient is competent. The risks and                         benefits of the procedure and the sedation options and                         risks were discussed with the patient. All questions                         were answered and informed consent was obtained.                         Patient identification and proposed procedure were                         verified by the physician, the nurse, the                         anesthesiologist, the anesthetist and the technician                         in the endoscopy suite. Mental Status Examination:                         alert and oriented. Airway Examination: normal                         oropharyngeal airway and neck mobility. Respiratory  Examination: clear to auscultation. CV Examination:                         normal. Prophylactic Antibiotics: The patient does not                         require prophylactic antibiotics. Prior                          Anticoagulants: The patient has taken Eliquis                         (apixaban), last dose was 3 days prior to procedure.                         ASA Grade Assessment: III - A patient with severe                         systemic disease. After reviewing the risks and                         benefits, the patient was deemed in satisfactory                         condition to undergo the procedure. The anesthesia                         plan was to use monitored anesthesia care (MAC).                         Immediately prior to administration of medications,                         the patient was re-assessed for adequacy to receive                         sedatives. The heart rate, respiratory rate, oxygen                         saturations, blood pressure, adequacy of pulmonary                         ventilation, and response to care were monitored                         throughout the procedure. The physical status of the                         patient was re-assessed after the procedure.                        After obtaining informed consent, the colonoscope was                         passed under direct vision. Throughout the procedure,                         the patient's blood pressure, pulse, and oxygen  saturations were monitored continuously. The                         Colonoscope was introduced through the anus and                         advanced to the the cecum, identified by appendiceal                         orifice and ileocecal valve. The colonoscopy was                         somewhat difficult due to significant looping.                         Successful completion of the procedure was aided by                         applying abdominal pressure. The patient tolerated the                         procedure well. The quality of the bowel preparation                         was fair. The ileocecal valve, appendiceal orifice,                          and rectum were photographed. Findings:      The perianal and digital rectal examinations were normal.      A tattoo was seen in the descending colon. The tattoo site appeared       normal.      Internal hemorrhoids were found during retroflexion. The hemorrhoids       were Grade I (internal hemorrhoids that do not prolapse).      The exam was otherwise without abnormality on direct and retroflexion       views. Impression:            - Preparation of the colon was fair.                        - A tattoo was seen in the descending colon. The                         tattoo site appeared normal.                        - Internal hemorrhoids.                        - The examination was otherwise normal on direct and                         retroflexion views.                        - No specimens collected. Recommendation:        - Discharge patient to home.                        -  Resume previous diet.                        - Resume Eliquis (apixaban) at prior dose today.                        - Repeat colonoscopy is not recommended due to current                         age (81 years or older) for surveillance. Even though                         prep was only fair, would not recommend repeat                         colonoscopy.                        - Return to referring physician as previously                         scheduled. Procedure Code(s):     --- Professional ---                        [id number], Colorectal cancer screening; colonoscopy on                         individual at high risk Diagnosis Code(s):     --- Professional ---                        Z86.010, Personal history of colonic polyps                        K64.0, First degree hemorrhoids CPT copyright 2022 American Medical Association. All rights reserved. The codes documented in this report are preliminary and upon coder review may  be revised to meet current compliance requirements. Andrey Farmer  MD, MD 04/21/2022 8:52:37 AM Number of Addenda: 0 Note Initiated On: 04/21/2022 8:10 AM Scope Withdrawal Time: 0 hours 4 minutes 20 seconds  Total Procedure Duration: 0 hours 13 minutes 45 seconds  Estimated Blood Loss:  Estimated blood loss: none.      [facility]

## 2022-04-21 NOTE — H&P (Signed)
Outpatient short stay form Pre-procedure 04/21/2022  Lesly Rubenstein, MD  Primary Physician: Wayland Denis, PA-C  Reason for visit:  Barrett's esophagus/History of polyps  History of present illness:    82 y/o gentleman with history of hypertension, CKD, OSA, a. Fib on eliquis with last dose 3 days ago, and CAD. Last colonoscopy around 10 years ago. History of cholecystectomy. No family history of GI malignancies. Has history of recurrent pneumonias of unclear cause.    Current Facility-Administered Medications:    0.9 %  sodium chloride infusion, , Intravenous, Continuous, [place], Hilton Cork, MD, Last Rate: 20 mL/hr at 04/21/22 [id number], Restarted at 04/21/22 0735  Medications Prior to Admission  Medication Sig Dispense Refill Last Dose   amLODipine (NORVASC) 5 MG tablet Take 5 mg by mouth at bedtime.   04/20/2022   apixaban (ELIQUIS) 2.5 MG TABS tablet Take 1 tablet (2.5 mg total) by mouth 2 (two) times daily. 180 tablet 1 Past Week   aspirin 81 MG tablet Take 81 mg by mouth at bedtime.   Past Week   carboxymethylcellulose (REFRESH PLUS) 0.5 % SOLN Place 1 drop into the right eye daily as needed (dry eyes).   Past Week   Cholecalciferol (VITAMIN D) 125 MCG (5000 UT) CAPS Take 5,000 Units by mouth daily.   Past Week   fluticasone (FLONASE) 50 MCG/ACT nasal spray Place 1 spray into both nostrils daily.   Past Week   pantoprazole (PROTONIX) 40 MG tablet Take 1 tablet (40 mg total) by mouth at bedtime. 90 tablet 3 Past Week   prednisoLONE acetate (PRED FORTE) 1 % ophthalmic suspension Place 1 drop into the left eye daily.   Past Week   simvastatin (ZOCOR) 20 MG tablet Take 20 mg by mouth daily.   Past Week   valACYclovir (VALTREX) 500 MG tablet Take 500 mg by mouth at bedtime.   Past Month   valsartan-hydrochlorothiazide (DIOVAN HCT) 160-25 MG tablet TAKE 1 TABLET DAILY 90 tablet 0 04/20/2022   Nutritional Supplements (BLADDER 2.2 PO) Take 1 tablet by mouth daily.        Allergies   Allergen Reactions   K Phos Mono-Sod Phos Di & Mono Diarrhea   Sulfur Dioxide Hives   Elemental Sulfur Other (See Comments)    intolerance   Phosphorus Other (See Comments)    intolerance   Potassium Phosphate     Other reaction(s): Diarrhea   Amoxicillin Rash and Hives     Past Medical History:  Diagnosis Date   Atrial fibrillation ([place])    Bladder cancer ([place])    Chronic kidney disease (CKD)    Coronary artery disease     Review of systems:  Otherwise negative.    Physical Exam  Gen: Alert, oriented. Appears stated age.  HEENT: PERRLA. Lungs: No respiratory distress CV: RRR Abd: soft, benign, no masses Ext: No edema    Planned procedures: Proceed with EGD/colonoscopy. The patient understands the nature of the planned procedure, indications, risks, alternatives and potential complications including but not limited to bleeding, infection, perforation, damage to internal organs and possible oversedation/side effects from anesthesia. The patient agrees and gives consent to proceed.  Please refer to procedure notes for findings, recommendations and patient disposition/instructions.     Lesly Rubenstein, MD [facility] Gastroenterology

## 2022-04-21 NOTE — Transfer of Care (Signed)
Immediate Anesthesia Transfer of Care Note  Patient: Jason Gates.  Procedure(s) Performed: COLONOSCOPY WITH PROPOFOL ESOPHAGOGASTRODUODENOSCOPY (EGD) WITH PROPOFOL  Patient Location: Endoscopy Unit  Anesthesia Type:General  Level of Consciousness: sedated  Airway & Oxygen Therapy: Patient Spontanous Breathing  Post-op Assessment: Report given to RN and Post -op Vital signs reviewed and stable  Post vital signs: Reviewed and stable  Last Vitals:  Vitals Value Taken Time  BP 89/47 04/21/22 0848  Temp 36.3 C 04/21/22 0845  Pulse 73 04/21/22 0848  Resp 17 04/21/22 0848  SpO2 90 % 04/21/22 0848  Vitals shown include unvalidated device data.  Last Pain:  Vitals:   04/21/22 0845  TempSrc: Temporal  PainSc: Asleep         Complications: No notable events documented.

## 2022-04-21 NOTE — Interval H&P Note (Signed)
History and Physical Interval Note:  04/21/2022 8:11 AM  Jason Gates.  has presented today for surgery, with the diagnosis of PH Colonic Polyps Esophageal Dysmotility GERD HO Barretts.  The various methods of treatment have been discussed with the patient and family. After consideration of risks, benefits and other options for treatment, the patient has consented to  Procedure(s): COLONOSCOPY WITH PROPOFOL (N/A) ESOPHAGOGASTRODUODENOSCOPY (EGD) WITH PROPOFOL (N/A) as a surgical intervention.  The patient's history has been reviewed, patient examined, no change in status, stable for surgery.  I have reviewed the patient's chart and labs.  Questions were answered to the patient's satisfaction.     Lesly Rubenstein  Ok to proceed with EGD/Colonoscopy

## 2022-04-22 ENCOUNTER — Other Ambulatory Visit: Payer: Self-pay | Admitting: Internal Medicine

## 2022-04-22 ENCOUNTER — Encounter: Payer: Self-pay | Admitting: Gastroenterology

## 2022-05-05 ENCOUNTER — Other Ambulatory Visit: Payer: Self-pay | Admitting: Internal Medicine

## 2022-05-05 NOTE — Telephone Encounter (Signed)
Refill request

## 2022-05-05 NOTE — Telephone Encounter (Signed)
Prescription refill request for Eliquis received.  Indication: afib  Last office visit: [place], 04/10/2021 Scr: 1.8, 1940-04-07 Age: 82 yo  Weight: 104.8 kg   Pt is scheduled to see cardiologist 05/14/2022

## 2022-05-14 ENCOUNTER — Ambulatory Visit: Payer: Medicare Other | Attending: Internal Medicine | Admitting: Internal Medicine

## 2022-05-14 ENCOUNTER — Encounter: Payer: Self-pay | Admitting: Internal Medicine

## 2022-05-14 VITALS — BP 144/80 | HR 70 | Ht 68.0 in | Wt 230.0 lb

## 2022-05-14 DIAGNOSIS — I10 Essential (primary) hypertension: Secondary | ICD-10-CM

## 2022-05-14 DIAGNOSIS — I480 Paroxysmal atrial fibrillation: Secondary | ICD-10-CM | POA: Diagnosis present

## 2022-05-14 DIAGNOSIS — E785 Hyperlipidemia, unspecified: Secondary | ICD-10-CM | POA: Insufficient documentation

## 2022-05-14 DIAGNOSIS — N1832 Chronic kidney disease, stage 3b: Secondary | ICD-10-CM | POA: Diagnosis present

## 2022-05-14 DIAGNOSIS — I2510 Atherosclerotic heart disease of native coronary artery without angina pectoris: Secondary | ICD-10-CM

## 2022-05-14 NOTE — Progress Notes (Unsigned)
Follow-up Outpatient Visit Date: 05/14/2022  Primary Care Provider: Wayland Denis, PA-C [place] [id number]  Chief Complaint: Follow-up coronary artery disease and paroxysmal atrial fibrillation  HPI:  Jason Gates is a 82 y.o. male with history of coronary artery disease status post remote PCI's to the LAD and diagonal branch that were complicated by what sounds like subacute stent thrombosis, paroxysmal atrial fibrillation, PSVT, hypertension, hyperlipidemia, chronic kidney disease, obstructive sleep apnea on CPAP, stroke, bladder cancer, and GERD, who presents for follow-up of coronary artery disease and atrial fibrillation.  He was last seen in our office in September by Gerrie Nordmann, NP, which time he was doing well.  He had been seen a month earlier for evaluation of chest pain and elevated blood pressure.  Subsequent myocardial perfusion stress test was low risk.  He noted intolerance to metoprolol succinate due to for sluggishness.  No medication changes or additional testing were pursued at that time.  Today, Jason Gates reports that he has been feeling fairly well.  He denies chest pain, shortness of breath, and lightheadedness.  He has sporadic palpitations that he attributes to his atrial fibrillation without associated symptoms.  Chronic mild lower extremity edema is stable.  He notes that he did not tolerate higher doses of amlodipine due to worsening leg edema in the past.  He reports having had an echocardiogram done at the [place] last month.  He is uncertain as to why this was ordered as well as what the results were.  He is trying to reach out to the [place] to obtain a copy that he can provide to [place].  --------------------------------------------------------------------------------------------------  Cardiovascular History & Procedures: Cardiovascular Problems: Coronary artery disease status post remote PCI's to LAD and D1 Paroxysmal atrial  fibrillation   Risk Factors: Known coronary artery disease, hypertension, hyperlipidemia, obesity, male gender, prior tobacco use, and age greater than 12   Cath/PCI: LHC (02/04/2013, [place], [place]): LMCA normal.  LAD with 20% proximal disease.  70-80% D1 disease.  D2 normal.  LCx with minimal luminal irregularities.  Normal dominant RCA.  LVEF 60-65%.  LVEDP 12 mmHg.  Patent stents. LHC (08/16/2009, [place], [place]): LMCA normal.  LAD with 30% proximal disease and patent proximal stent.  Distal LAD tapers to a small vessel.  D1 stent is occluded.  D2 normal.  LCx with 30% ostial stenosis with calcification.  Large, dominant RCA with mild luminal irregularities.  LVEF 60-65% with LVEDP 15 mmHg.   CV Surgery: None   EP Procedures and Devices: None   Non-Invasive Evaluation(s): Pharmacologic MPI (10/29/2021): Low risk, probably normal pharmacologic myocardial perfusion stress test.  There is a small in size, moderate in severity, fixed apical and apical lateral defect most consistent with artifact but cannot rule out scar.  No significant ischemia identified.  LVEF greater than 65%. Pharmacologic MPI (02/01/2020, [place], [place]): Normal study without ischemia or scar.  LVEF 60-70%. TTE (07/15/2017, [place], [place]): Normal LV size with mild LVH.  LVEF 55-60% with grade 1 diastolic dysfunction.  Normal RV size and function.  Trivial MR, TR, and AI.  Recent CV Pertinent Labs: Lab Results  Component Value Date   CHOL 137 01/31/2021   HDL 35 (L) 01/31/2021   LDLCALC 68 01/31/2021   TRIG 204 (H) 01/31/2021   CHOLHDL 3.9 01/31/2021   BNP 89.5 11/25/2021   K 3.8 11/27/2021   MG 2.1 10/13/2021   BUN 27 (H) 11/27/2021   BUN 19 06/10/2021   CREATININE 1.68 (  H) 11/27/2021    Past medical and surgical history were reviewed and updated in EPIC.  Current Meds  Medication Sig   amLODipine (NORVASC) 5 MG tablet Take 5 mg by mouth at bedtime.   apixaban (ELIQUIS) 2.5 MG TABS tablet TAKE 1 TABLET TWICE A DAY    aspirin 81 MG tablet Take 81 mg by mouth at bedtime.   carboxymethylcellulose (REFRESH PLUS) 0.5 % SOLN Place 1 drop into the right eye daily as needed (dry eyes).   Cholecalciferol (VITAMIN D) 125 MCG (5000 UT) CAPS Take 5,000 Units by mouth daily.   fluticasone (FLONASE) 50 MCG/ACT nasal spray Place 1 spray into both nostrils daily.   Nutritional Supplements (BLADDER 2.2 PO) Take 1 tablet by mouth daily.   pantoprazole (PROTONIX) 40 MG tablet Take 1 tablet (40 mg total) by mouth at bedtime.   prednisoLONE acetate (PRED FORTE) 1 % ophthalmic suspension Place 1 drop into the left eye daily.   simvastatin (ZOCOR) 20 MG tablet Take 20 mg by mouth daily.   valACYclovir (VALTREX) 500 MG tablet Take 500 mg by mouth at bedtime.   valsartan-hydrochlorothiazide (DIOVAN HCT) 160-25 MG tablet Take 1 tablet by mouth daily. Office visit needed for additional refill    Allergies: K phos mono-sod phos di & mono, Sulfur dioxide, Elemental sulfur, Phosphorus, Potassium phosphate, and Amoxicillin  Social History   Tobacco Use   Smoking status: Former    Packs/day: 1.00    Years: 15.00    Additional pack years: 0.00    Total pack years: 15.00    Types: Cigarettes    Quit date: 51    Years since quitting: 48.2   Tobacco comments:    Quit smoking in 1976  Vaping Use   Vaping Use: Never used  Substance Use Topics   Alcohol use: Not Currently    Comment: quit in 1976   Drug use: Never    Family History  Problem Relation Age of Onset   Obesity Mother    Diabetes Father    Heart attack Father    Heart disease Father     Review of Systems: A 12-system review of systems was performed and was negative except as noted in the HPI.  --------------------------------------------------------------------------------------------------  Physical Exam: BP (!) 160/74 (BP Location: Left Arm, Patient Position: Sitting, Cuff Size: Large)   Pulse 70   Ht 5\' 8"  (1.727 m)   Wt 230 lb (104.3 kg)   SpO2 95%    BMI 34.97 kg/m  Repeat blood pressure: 144/80  General:  NAD. Neck: No JVD or HJR. Lungs: Clear to auscultation bilaterally without wheezes or crackles. Heart: Regular rate and rhythm with occasional extrasystoles.  No murmurs. Abdomen: Soft, nontender, nondistended. Extremities: Trace pretibial edema bilaterally.  EKG: Normal sinus rhythm with PACs.  Otherwise, no significant abnormality.  No significant change from prior tracing on 11/25/2021.  Lab Results  Component Value Date   WBC 9.9 11/25/2021   HGB 14.1 11/25/2021   HCT 43.1 11/25/2021   MCV 104.6 (H) 11/25/2021   PLT 201 11/25/2021    Lab Results  Component Value Date   NA 140 11/27/2021   K 3.8 11/27/2021   CL 106 11/27/2021   CO2 29 11/27/2021   BUN 27 (H) 11/27/2021   CREATININE 1.68 (H) 11/27/2021   GLUCOSE 105 (H) 11/27/2021   ALT 28 11/25/2021    Lab Results  Component Value Date   CHOL 137 01/31/2021   HDL 35 (L) 01/31/2021   LDLCALC 68  01/31/2021   TRIG 204 (H) 01/31/2021   CHOLHDL 3.9 01/31/2021    --------------------------------------------------------------------------------------------------  ASSESSMENT AND PLAN: Coronary artery disease and hyperlipidemia: No angina reported.  Given history of subacute stent thrombosis, we will continue with aspirin and apixaban as tolerated as well as simvastatin with reasonable LDL control on last lipid panel in 02/2022.  Triglycerides mildly elevated at that time; continue to work on lifestyle modifications.  Hypertension: Blood pressure mildly elevated today albeit better on recheck.  He notes that his home blood pressures typically better, usually around 130/70.  He does not wish to rechallenge a higher dose of amlodipine due to swelling associated with this in the past.  Escalation of valsartan could be considered, though we will need to keep a close eye on his renal function.  Jason Gates is scheduled for follow-up with his nephrologist later this week.  If  blood pressure remains significantly elevated at that time, escalation of his antihypertensive therapy will need to be considered.  I encouraged Jason Gates to work on weight loss through diet and exercise as well as to minimize his sodium intake.  Paroxysmal atrial fibrillation: Jason Gates is in sinus rhythm today with PACs.  He reports sporadic, self-limited palpitations that could be related to atrial runs or paroxysmal atrial fibrillation.  Continue apixaban 2.5 mg twice daily based on age and renal function.  Defer AV nodal blocking agents at this time.  Chronic kidney disease stage IIIb: Renal function relatively stable.  Continue current medications and close follow-up with nephrology.  Follow-up: Return to clinic in 6 months.  Nelva Bush, MD 05/14/2022 3:07 PM

## 2022-05-14 NOTE — Patient Instructions (Signed)
Medication Instructions:  Your Physician recommend you continue on your current medication as directed.    *If you need a refill on your cardiac medications before your next appointment, please call your pharmacy*   Lab Work: None ordered today   Testing/Procedures: None ordered today   Follow-Up: At [facility], you and your health needs are our priority.  As part of our continuing mission to provide you with exceptional heart care, we have created designated Provider Care Teams.  These Care Teams include your primary Cardiologist (physician) and Advanced Practice Providers (APPs -  Physician Assistants and Nurse Practitioners) who all work together to provide you with the care you need, when you need it.  We recommend signing up for the patient portal called "MyChart".  Sign up information is provided on this After Visit Summary.  MyChart is used to connect with patients for Virtual Visits (Telemedicine).  Patients are able to view lab/test results, encounter notes, upcoming appointments, etc.  Non-urgent messages can be sent to your provider as well.   To learn more about what you can do with MyChart, go to [email or web address].    Your next appointment:   6 month(s)  Provider:   You may see Christopher End, MD or one of the following Advanced Practice Providers on your designated Care Team:   Christopher Berge, NP Ryan Dunn, PA-C Cadence Furth, PA-C Sheri Hammock, NP     

## 2022-05-15 ENCOUNTER — Encounter: Payer: Self-pay | Admitting: Internal Medicine

## 2022-06-10 ENCOUNTER — Encounter: Payer: Self-pay | Admitting: Urology

## 2022-06-10 ENCOUNTER — Ambulatory Visit (INDEPENDENT_AMBULATORY_CARE_PROVIDER_SITE_OTHER): Payer: Medicare Other | Admitting: Urology

## 2022-06-10 VITALS — BP 162/77 | HR 97 | Ht 68.0 in | Wt 215.0 lb

## 2022-06-10 DIAGNOSIS — Z8551 Personal history of malignant neoplasm of bladder: Secondary | ICD-10-CM | POA: Diagnosis not present

## 2022-06-10 LAB — MICROSCOPIC EXAMINATION

## 2022-06-10 LAB — URINALYSIS, COMPLETE
Bilirubin, UA: NEGATIVE
Glucose, UA: NEGATIVE
Leukocytes,UA: NEGATIVE
Nitrite, UA: NEGATIVE
RBC, UA: NEGATIVE
Specific Gravity, UA: 1.025 (ref 1.005–1.030)
Urobilinogen, Ur: 0.2 mg/dL (ref 0.2–1.0)
pH, UA: 5.5 (ref 5.0–7.5)

## 2022-06-10 NOTE — Progress Notes (Signed)
   06/10/22 CC:  Chief Complaint  Patient presents with   Cysto     HPI: Jason Gates. is a 82 y.o. male with a personal history of CKD stage 3b, bladder cancer, CIS, and proteinuria, who presents today for annual cysto.   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  He underwent a TURBT in 2014 and 2018 it was high- grade he had BCG x3 that he didn't tolerate.   He is currently followed by nephrology.   He has a history of smoking.   No urinary issues.  UA negative.    Vitals:   06/10/22 1334  BP: (!) 162/77  Pulse: 97   NED. A&Ox3.   No respiratory distress   Abd soft, NT, ND Normal phallus with bilateral descended testicles  Cystoscopy Procedure Note  Patient identification was confirmed, informed consent was obtained, and patient was prepped using Betadine solution.  Lidocaine jelly was administered per urethral meatus.     Pre-Procedure: - Inspection reveals a normal caliber ureteral meatus.  Procedure: The flexible cystoscope was introduced without difficulty - No urethral strictures/lesions are present. - mildly enlarged prostate  - Normal bladder neck - Bilateral ureteral orifices identified - Bladder mucosa  multiple TUR stellate scars but no tumors  - No bladder stones - Mild trabeculation  Retroflexion shows NED today    Post-Procedure: - Patient tolerated the procedure well   Assessment/ Plan: History of bladder cancer  - -S/p TURBT in 2014 and 2018.  - Cystoscopy showed NED  - Will monitor with surveillance cystoscopy in 1 year.    [name], MD

## 2022-07-30 ENCOUNTER — Ambulatory Visit: Payer: Medicare Other | Admitting: Urology

## 2022-08-04 ENCOUNTER — Other Ambulatory Visit: Payer: Self-pay | Admitting: Internal Medicine

## 2022-08-04 NOTE — Telephone Encounter (Signed)
Refill request

## 2022-08-04 NOTE — Telephone Encounter (Signed)
Prescription refill request for Eliquis received. Indication:afib Last office visit:3/24 Scr:2.5  4/24 Age: 82 Weight:97.5  kg  Prescription refilled

## 2022-09-19 ENCOUNTER — Other Ambulatory Visit (HOSPITAL_COMMUNITY): Payer: Self-pay | Admitting: Nephrology

## 2022-09-19 DIAGNOSIS — R600 Localized edema: Secondary | ICD-10-CM

## 2022-09-24 ENCOUNTER — Ambulatory Visit (INDEPENDENT_AMBULATORY_CARE_PROVIDER_SITE_OTHER): Payer: Medicare Other | Admitting: Urology

## 2022-09-24 VITALS — BP 153/79 | HR 71 | Ht 68.0 in | Wt 234.2 lb

## 2022-09-24 DIAGNOSIS — R350 Frequency of micturition: Secondary | ICD-10-CM

## 2022-09-24 DIAGNOSIS — Z8551 Personal history of malignant neoplasm of bladder: Secondary | ICD-10-CM | POA: Diagnosis not present

## 2022-09-24 DIAGNOSIS — R3912 Poor urinary stream: Secondary | ICD-10-CM

## 2022-09-24 DIAGNOSIS — N401 Enlarged prostate with lower urinary tract symptoms: Secondary | ICD-10-CM | POA: Diagnosis not present

## 2022-09-24 DIAGNOSIS — N528 Other male erectile dysfunction: Secondary | ICD-10-CM

## 2022-09-24 LAB — MICROSCOPIC EXAMINATION

## 2022-09-24 LAB — URINALYSIS, COMPLETE
Bilirubin, UA: NEGATIVE
Glucose, UA: NEGATIVE
Ketones, UA: NEGATIVE
Leukocytes,UA: NEGATIVE
Nitrite, UA: NEGATIVE
Protein,UA: NEGATIVE
RBC, UA: NEGATIVE
Specific Gravity, UA: 1.025 (ref 1.005–1.030)
Urobilinogen, Ur: 0.2 mg/dL (ref 0.2–1.0)
pH, UA: 5.5 (ref 5.0–7.5)

## 2022-09-24 LAB — BLADDER SCAN AMB NON-IMAGING: Scan Result: 0

## 2022-09-24 MED ORDER — ALFUZOSIN HCL ER 10 MG PO TB24
10.0000 mg | ORAL_TABLET | Freq: Every day | ORAL | 11 refills | Status: DC
Start: 2022-09-24 — End: 2022-10-01

## 2022-09-24 MED ORDER — SILDENAFIL CITRATE 20 MG PO TABS: ORAL_TABLET | ORAL | 11 refills | Status: DC

## 2022-09-24 NOTE — Progress Notes (Incomplete)
Jason Gates,acting as a scribe for Jason Scotland, MD.,have documented all relevant documentation on the behalf of Jason Scotland, MD,as directed by  Jason Scotland, MD while in the presence of Jason Scotland, MD.  09/24/2022 2:04 PM   Jason Gates. 02/23/1941 [id number]  Referring provider: Carren Rang, PA-C [address] [place],  [place] [address]  Chief Complaint Patient presents with  weak urinary stream   HPI: 82 year-old male with a personal history of bladder cancer who underwent surveillance cystoscopy back in 05/2022. He presents today to primarily discuss his urinary symptoms including weak urinary stream. He is not currently on any BPH medications. We have not previously managed his urinary symptoms.  He does not have a recent PSA.  His urinalysis today is negative.  Today, he reports a weak urinary stream, which he is unhappy about. He reports that over the last couple of months, his urinary stream has significantly slowed down. He also notes that fluids seem to pass through him quickly, necessitating frequent urination, especially if he drinks the recommended amount of water. He typically takes his medications in the morning and evening, and if he drinks more fluids, he wakes up multiple times at night to urinate. He previously tried an over-the-counter product, ProstaGenix, which caused his blood pressure to spike, leading him to discontinue it. He also had a poor experience with Flomax 20 years ago, which caused blurry vision. He is interested in exploring other treatment options for his urinary symptoms, including medications and procedures like UroLift.   Additionally, he inquired about treatment for erectile dysfunction, noting that he has not been sexually active for several years due to his wife's illness and subsequent passing. He expressed interest in trying sildenafil.  Results for orders placed or performed in visit on  09/24/22 Bladder Scan (Post Void Residual) in office Result Value Ref Range Scan Result 0 ml  IPSS  Row Name 09/24/22 1300 International Prostate Symptom Score How often have you had the sensation of not emptying your bladder? Not at All How often have you had to urinate less than every two hours? Not at All How often have you found you stopped and started again several times when you urinated? Not at All How often have you found it difficult to postpone urination? Not at All How often have you had a weak urinary stream? More than half the time How often have you had to strain to start urination? Less than 1 in 5 times How many times did you typically get up at night to urinate? 1 Time Total IPSS Score 6 Quality of Life due to urinary symptoms If you were to spend the rest of your life with your urinary condition just the way it is now how would you feel about that? Mostly Disatisfied    Score:  1-7 Mild 8-19 Moderate 20-35 Severe     PMH: Past Medical History: Diagnosis Date  Atrial fibrillation (HCC)  Bladder cancer (HCC)  Chronic kidney disease (CKD)  Coronary artery disease   Surgical History: Past Surgical History: Procedure Laterality Date  CARDIAC SURGERY  CHOLECYSTECTOMY  COLONOSCOPY WITH PROPOFOL N/A 04/21/2022 Procedure: COLONOSCOPY WITH PROPOFOL;  Surgeon: Regis Bill, MD;  Location: [facility] ENDOSCOPY;  Service: Endoscopy;  Laterality: N/A;  CORONARY ANGIOPLASTY "two stents in 2007"  ESOPHAGOGASTRODUODENOSCOPY (EGD) WITH PROPOFOL N/A 04/21/2022 Procedure: ESOPHAGOGASTRODUODENOSCOPY (EGD) WITH PROPOFOL;  Surgeon: Regis Bill, MD;  Location: [facility] ENDOSCOPY;  Service: Endoscopy;  Laterality: N/A;  EYE  SURGERY  NASAL SEPTUM SURGERY   Home Medications:  Allergies as of 09/24/2022     Reactions K Phos Mono-sod Phos Di & Mono Diarrhea Sulfur Dioxide Hives Elemental Sulfur Other (See  Comments) intolerance Phosphorus Other (See Comments) intolerance Potassium Phosphate Other reaction(s): Diarrhea Amoxicillin Rash, Hives   Medication List   Accurate as of September 24, 2022  2:04 PM. If you have any questions, ask your nurse or doctor.   STOP taking these medications   prednisoLONE acetate 1 % ophthalmic suspension Commonly known as: PRED FORTE   TAKE these medications   alfuzosin 10 MG 24 hr tablet Commonly known as: Uroxatral Take 1 tablet (10 mg total) by mouth daily with breakfast. amLODipine 5 MG tablet Commonly known as: NORVASC Take 5 mg by mouth at bedtime. aspirin 81 MG tablet Take 81 mg by mouth at bedtime. BLADDER 2.2 PO Take 1 tablet by mouth daily. carboxymethylcellulose 0.5 % Soln Commonly known as: REFRESH PLUS Place 1 drop into the right eye daily as needed (dry eyes). Eliquis 2.5 MG Tabs tablet Generic drug: apixaban TAKE 1 TABLET TWICE A DAY fluticasone 50 MCG/ACT nasal spray Commonly known as: FLONASE Place 1 spray into both nostrils daily. pantoprazole 40 MG tablet Commonly known as: PROTONIX Take 1 tablet (40 mg total) by mouth at bedtime. sildenafil 20 MG tablet Commonly known as: REVATIO Take 1 to 5 tablets daily as needed 1 hour prior to intercourse simvastatin 20 MG tablet Commonly known as: ZOCOR Take 20 mg by mouth daily. valACYclovir 500 MG tablet Commonly known as: VALTREX Take 500 mg by mouth at bedtime. valsartan-hydrochlorothiazide 80-12.5 MG tablet Commonly known as: DIOVAN-HCT Take 1 tablet by mouth daily. What changed: Another medication with the same name was removed. Continue taking this medication, and follow the directions you see here. Vitamin D 125 MCG (5000 UT) Caps Take 5,000 Units by mouth daily.    Allergies:  Allergies Allergen Reactions  K Phos Mono-Sod Phos Di & Mono Diarrhea  Sulfur Dioxide Hives  Elemental Sulfur Other (See Comments) intolerance  Phosphorus Other (See  Comments) intolerance  Potassium Phosphate Other reaction(s): Diarrhea  Amoxicillin Rash and Hives   Family History: Family History Problem Relation Age of Onset  Obesity Mother  Diabetes Father  Heart attack Father  Heart disease Father   Social History:  reports that he quit smoking about 48 years ago. His smoking use included cigarettes. He started smoking about 63 years ago. He has a 15 pack-year smoking history. He does not have any smokeless tobacco history on file. He reports that he does not currently use alcohol. He reports that he does not use drugs.   Physical Exam: Ht 5\' 8"  (1.727 m)   Wt 234 lb 4 oz (106.3 kg)   BMI 35.62 kg/m   Constitutional:  Alert and oriented, No acute distress. HEENT: [place] AT, moist mucus membranes.  Trachea midline, no masses. Neurologic: Grossly intact, no focal deficits, moving all 4 extremities. Psychiatric: Normal mood and affect.   Assessment & Plan:    1. Erectile dysfunction We discussed the pathophysiology of erectile dysfunction today along with possible contributing factors. Discussed possible treatment options including PDE 5 inhibitors.  In terms of PDE 5 inhibitors, we discussed contraindications for this medication as well as common side effects. Patient was counseled on optimal use. All of his questions were answered in detail. - Start on sildenafil 20 mg as needed up to 100 mg -No contraindications, discussed possible side effects  2. BPH  with weak urinary stream - Discussed symptoms and treatment options including UroLift vs Turp vs HoLEP, which was explained in detail, including risks and benefits- prefers to avoid surgery - Plan to start Uroxatral 10 mg to manage urinary symptoms, previously unable to tolerate Flomax.  Advised to stop if he has any visual symptoms with this medication. - Follow-up in three months to assess efficacy and side effects. - PSA today to rule out prostate cancer  3. History of  bladder cancer - Due for cystoscopy in 05/2023 which is already scheduled - No evidence of recurrence   Return in about 3 months (around 12/25/2022) for symptom reassessment with Sam or Carollee Herter.  I have reviewed the above documentation for accuracy and completeness, and I agree with the above.   Jason Scotland, MD    [facility] [address], [address] [place], [place] [address] [phone]

## 2022-09-25 ENCOUNTER — Ambulatory Visit (HOSPITAL_COMMUNITY)
Admission: RE | Admit: 2022-09-25 | Discharge: 2022-09-25 | Disposition: A | Discharge status: Home / Self Care | Payer: Medicare Other | Source: Ambulatory Visit | Attending: Nephrology | Admitting: Nephrology

## 2022-09-25 DIAGNOSIS — I4891 Unspecified atrial fibrillation: Secondary | ICD-10-CM | POA: Diagnosis not present

## 2022-09-25 DIAGNOSIS — R600 Localized edema: Secondary | ICD-10-CM | POA: Insufficient documentation

## 2022-09-25 DIAGNOSIS — I2510 Atherosclerotic heart disease of native coronary artery without angina pectoris: Secondary | ICD-10-CM | POA: Diagnosis not present

## 2022-09-25 LAB — ECHOCARDIOGRAM COMPLETE
AR max vel: 2.07 cm2
AV Area VTI: 2.35 cm2
AV Area mean vel: 2.09 cm2
AV Mean grad: 5 mmHg
AV Peak grad: 9.1 mmHg
Ao pk vel: 1.51 m/s
Area-P 1/2: 3.2 cm2
S' Lateral: 3.8 cm

## 2022-09-26 ENCOUNTER — Telehealth: Payer: Self-pay | Admitting: Internal Medicine

## 2022-09-26 NOTE — Telephone Encounter (Signed)
Pt made aware and verbalized understanding.   Charlsie Quest, NP  You1 hour ago (2:10 PM)    Please let Mr Schwieger know that we are unable to result test that are not ordered by one of our MD's or APP's. We will be more than happy to explain anything to him at his next visit related to the echo that we can. Have him reach out to the ordering provider to get his results. Thanks, [employer]

## 2022-09-26 NOTE — Telephone Encounter (Signed)
Pt had an Echo done yesterday in the hospital and is requesting a callback to be sure that MD Is able to take a look at the results. Please advise

## 2022-09-26 NOTE — Telephone Encounter (Signed)
Pt called stating he had an ECHO completed yesterday that was order by his nephrologist, and inquiring if Dr. Okey Dupre  could review results.   Pt made aware Dr. Okey Dupre is out of the office this week, but would forward message for review upon return. Pt then requested results be reviewed by an APP.  Per chart review, pt last seen by Charlsie Quest, NP on 10/14/21. Will forward to NP to review.

## 2022-09-30 ENCOUNTER — Emergency Department: Payer: Medicare Other

## 2022-09-30 ENCOUNTER — Observation Stay
Admission: EM | Admit: 2022-09-30 | Discharge: 2022-10-01 | Disposition: A | Discharge status: Home / Self Care | Payer: Medicare Other | Attending: Internal Medicine | Admitting: Internal Medicine

## 2022-09-30 ENCOUNTER — Other Ambulatory Visit: Payer: Self-pay

## 2022-09-30 ENCOUNTER — Observation Stay: Payer: Medicare Other

## 2022-09-30 DIAGNOSIS — H9312 Tinnitus, left ear: Secondary | ICD-10-CM | POA: Insufficient documentation

## 2022-09-30 DIAGNOSIS — Z87891 Personal history of nicotine dependence: Secondary | ICD-10-CM | POA: Insufficient documentation

## 2022-09-30 DIAGNOSIS — R42 Dizziness and giddiness: Secondary | ICD-10-CM | POA: Diagnosis not present

## 2022-09-30 DIAGNOSIS — Z79899 Other long term (current) drug therapy: Secondary | ICD-10-CM | POA: Insufficient documentation

## 2022-09-30 DIAGNOSIS — N1832 Chronic kidney disease, stage 3b: Secondary | ICD-10-CM | POA: Diagnosis not present

## 2022-09-30 DIAGNOSIS — I480 Paroxysmal atrial fibrillation: Secondary | ICD-10-CM | POA: Diagnosis present

## 2022-09-30 DIAGNOSIS — Z7901 Long term (current) use of anticoagulants: Secondary | ICD-10-CM | POA: Diagnosis not present

## 2022-09-30 DIAGNOSIS — I2510 Atherosclerotic heart disease of native coronary artery without angina pectoris: Secondary | ICD-10-CM | POA: Diagnosis not present

## 2022-09-30 DIAGNOSIS — I10 Essential (primary) hypertension: Secondary | ICD-10-CM | POA: Diagnosis not present

## 2022-09-30 DIAGNOSIS — I700 Atherosclerosis of aorta: Secondary | ICD-10-CM | POA: Insufficient documentation

## 2022-09-30 DIAGNOSIS — Z8551 Personal history of malignant neoplasm of bladder: Secondary | ICD-10-CM | POA: Diagnosis not present

## 2022-09-30 DIAGNOSIS — K219 Gastro-esophageal reflux disease without esophagitis: Secondary | ICD-10-CM | POA: Diagnosis present

## 2022-09-30 DIAGNOSIS — Z7982 Long term (current) use of aspirin: Secondary | ICD-10-CM | POA: Insufficient documentation

## 2022-09-30 DIAGNOSIS — R112 Nausea with vomiting, unspecified: Secondary | ICD-10-CM | POA: Diagnosis not present

## 2022-09-30 DIAGNOSIS — I482 Chronic atrial fibrillation, unspecified: Secondary | ICD-10-CM | POA: Diagnosis not present

## 2022-09-30 DIAGNOSIS — I6523 Occlusion and stenosis of bilateral carotid arteries: Secondary | ICD-10-CM | POA: Diagnosis not present

## 2022-09-30 DIAGNOSIS — Z955 Presence of coronary angioplasty implant and graft: Secondary | ICD-10-CM | POA: Insufficient documentation

## 2022-09-30 DIAGNOSIS — E785 Hyperlipidemia, unspecified: Secondary | ICD-10-CM | POA: Diagnosis present

## 2022-09-30 DIAGNOSIS — N183 Chronic kidney disease, stage 3 unspecified: Secondary | ICD-10-CM

## 2022-09-30 LAB — CBC WITH DIFFERENTIAL/PLATELET
Abs Immature Granulocytes: 0.03 10*3/uL (ref 0.00–0.07)
Basophils Absolute: 0.1 10*3/uL (ref 0.0–0.1)
Basophils Relative: 1 %
Eosinophils Absolute: 0.3 10*3/uL (ref 0.0–0.5)
Eosinophils Relative: 3 %
HCT: 48.9 % (ref 39.0–52.0)
Hemoglobin: 16 g/dL (ref 13.0–17.0)
Immature Granulocytes: 0 %
Lymphocytes Relative: 35 %
Lymphs Abs: 3.2 10*3/uL (ref 0.7–4.0)
MCH: 33.8 pg (ref 26.0–34.0)
MCHC: 32.7 g/dL (ref 30.0–36.0)
MCV: 103.2 fL — ABNORMAL HIGH (ref 80.0–100.0)
Monocytes Absolute: 0.5 10*3/uL (ref 0.1–1.0)
Monocytes Relative: 6 %
Neutro Abs: 5.1 10*3/uL (ref 1.7–7.7)
Neutrophils Relative %: 55 %
Platelets: 227 10*3/uL (ref 150–400)
RBC: 4.74 MIL/uL (ref 4.22–5.81)
RDW: 13.4 % (ref 11.5–15.5)
WBC: 9.2 10*3/uL (ref 4.0–10.5)
nRBC: 0 % (ref 0.0–0.2)

## 2022-09-30 LAB — COMPREHENSIVE METABOLIC PANEL
ALT: 34 U/L (ref 0–44)
AST: 44 U/L — ABNORMAL HIGH (ref 15–41)
Albumin: 4.7 g/dL (ref 3.5–5.0)
Alkaline Phosphatase: 61 U/L (ref 38–126)
Anion gap: 10 (ref 5–15)
BUN: 27 mg/dL — ABNORMAL HIGH (ref 8–23)
CO2: 28 mmol/L (ref 22–32)
Calcium: 9.5 mg/dL (ref 8.9–10.3)
Chloride: 103 mmol/L (ref 98–111)
Creatinine, Ser: 1.81 mg/dL — ABNORMAL HIGH (ref 0.61–1.24)
GFR, Estimated: 37 mL/min — ABNORMAL LOW (ref >60)
Glucose, Bld: 148 mg/dL — ABNORMAL HIGH (ref 70–99)
Potassium: 3.4 mmol/L — ABNORMAL LOW (ref 3.5–5.1)
Sodium: 141 mmol/L (ref 135–145)
Total Bilirubin: 1.1 mg/dL (ref 0.3–1.2)
Total Protein: 7.6 g/dL (ref 6.5–8.1)

## 2022-09-30 LAB — TROPONIN I (HIGH SENSITIVITY)
Troponin I (High Sensitivity): 15 ng/L (ref <18)
Troponin I (High Sensitivity): 13 ng/L (ref <18)

## 2022-09-30 LAB — PROTIME-INR
INR: 1.2 (ref 0.8–1.2)
Prothrombin Time: 15 seconds (ref 11.4–15.2)

## 2022-09-30 MED ORDER — SODIUM CHLORIDE 0.9 % IV BOLUS
1000.0000 mL | Freq: Once | INTRAVENOUS | Status: AC
  Administered 2022-09-30: 1000 mL via INTRAVENOUS

## 2022-09-30 MED ORDER — ACETAMINOPHEN 325 MG PO TABS
650.0000 mg | ORAL_TABLET | Freq: Four times a day (QID) | ORAL | Status: DC | PRN
  Administered 2022-09-30: 650 mg via ORAL
  Filled 2022-09-30: qty 2

## 2022-09-30 MED ORDER — MECLIZINE HCL 25 MG PO TABS: 50.0000 mg | ORAL_TABLET | Freq: Three times a day (TID) | ORAL | Status: DC

## 2022-09-30 MED ORDER — MECLIZINE HCL 25 MG PO TABS: 25.0000 mg | ORAL_TABLET | Freq: Three times a day (TID) | ORAL | Status: DC | PRN

## 2022-09-30 MED ORDER — IOHEXOL 350 MG/ML SOLN
90.0000 mL | Freq: Once | INTRAVENOUS | Status: AC | PRN
  Administered 2022-09-30: 90 mL via INTRAVENOUS

## 2022-09-30 MED ORDER — ASPIRIN 81 MG PO CHEW
81.0000 mg | CHEWABLE_TABLET | Freq: Every day | ORAL | Status: DC
  Administered 2022-09-30: 81 mg via ORAL
  Filled 2022-09-30: qty 1

## 2022-09-30 MED ORDER — ONDANSETRON HCL 4 MG/2ML IJ SOLN
4.0000 mg | Freq: Once | INTRAMUSCULAR | Status: AC
  Administered 2022-09-30: 4 mg via INTRAVENOUS
  Filled 2022-09-30: qty 2

## 2022-09-30 MED ORDER — MECLIZINE HCL 25 MG PO TABS
25.0000 mg | ORAL_TABLET | Freq: Four times a day (QID) | ORAL | Status: DC
  Filled 2022-09-30 (×2): qty 1

## 2022-09-30 MED ORDER — VALACYCLOVIR HCL 500 MG PO TABS
500.0000 mg | ORAL_TABLET | Freq: Every day | ORAL | Status: DC
  Administered 2022-09-30: 500 mg via ORAL
  Filled 2022-09-30: qty 1

## 2022-09-30 MED ORDER — LORAZEPAM 2 MG/ML IJ SOLN
0.5000 mg | Freq: Once | INTRAMUSCULAR | Status: AC
  Administered 2022-09-30: 0.5 mg via INTRAVENOUS
  Filled 2022-09-30: qty 1

## 2022-09-30 MED ORDER — GADOBUTROL 1 MMOL/ML IV SOLN
10.0000 mL | Freq: Once | INTRAVENOUS | Status: AC | PRN
  Administered 2022-09-30: 10 mL via INTRAVENOUS

## 2022-09-30 MED ORDER — SIMVASTATIN 20 MG PO TABS
20.0000 mg | ORAL_TABLET | Freq: Every day | ORAL | Status: DC
  Administered 2022-09-30 – 2022-10-01 (×2): 20 mg via ORAL
  Filled 2022-09-30 (×2): qty 1

## 2022-09-30 MED ORDER — SODIUM CHLORIDE 0.9 % IV SOLN: INTRAVENOUS | Status: DC

## 2022-09-30 MED ORDER — ONDANSETRON HCL 4 MG/2ML IJ SOLN: 4.0000 mg | Freq: Four times a day (QID) | INTRAMUSCULAR | Status: DC | PRN

## 2022-09-30 MED ORDER — PANTOPRAZOLE SODIUM 40 MG PO TBEC
40.0000 mg | DELAYED_RELEASE_TABLET | Freq: Every day | ORAL | Status: DC
  Administered 2022-09-30: 40 mg via ORAL
  Filled 2022-09-30: qty 1

## 2022-09-30 MED ORDER — HYDROCHLOROTHIAZIDE 12.5 MG PO TABS
12.5000 mg | ORAL_TABLET | Freq: Every day | ORAL | Status: DC
  Administered 2022-09-30 – 2022-10-01 (×2): 12.5 mg via ORAL
  Filled 2022-09-30 (×2): qty 1

## 2022-09-30 MED ORDER — VALSARTAN-HYDROCHLOROTHIAZIDE 80-12.5 MG PO TABS: 1.0000 | ORAL_TABLET | Freq: Every day | ORAL | Status: DC

## 2022-09-30 MED ORDER — APIXABAN 2.5 MG PO TABS: 2.5000 mg | ORAL_TABLET | Freq: Two times a day (BID) | ORAL | Status: DC

## 2022-09-30 MED ORDER — MECLIZINE HCL 25 MG PO TABS
50.0000 mg | ORAL_TABLET | Freq: Once | ORAL | Status: AC
  Administered 2022-09-30: 50 mg via ORAL
  Filled 2022-09-30: qty 2

## 2022-09-30 MED ORDER — MECLIZINE HCL 25 MG PO TABS: 12.5000 mg | ORAL_TABLET | Freq: Once | ORAL | Status: DC

## 2022-09-30 MED ORDER — APIXABAN 2.5 MG PO TABS
2.5000 mg | ORAL_TABLET | Freq: Two times a day (BID) | ORAL | Status: DC
  Administered 2022-09-30 – 2022-10-01 (×2): 2.5 mg via ORAL
  Filled 2022-09-30 (×2): qty 1

## 2022-09-30 MED ORDER — AMLODIPINE BESYLATE 5 MG PO TABS
5.0000 mg | ORAL_TABLET | Freq: Every day | ORAL | Status: DC
  Administered 2022-09-30: 5 mg via ORAL
  Filled 2022-09-30: qty 1

## 2022-09-30 MED ORDER — IRBESARTAN 150 MG PO TABS
75.0000 mg | ORAL_TABLET | Freq: Every day | ORAL | Status: DC
  Administered 2022-09-30 – 2022-10-01 (×2): 75 mg via ORAL
  Filled 2022-09-30 (×3): qty 1

## 2022-09-30 MED ORDER — ONDANSETRON HCL 4 MG PO TABS: 4.0000 mg | ORAL_TABLET | Freq: Four times a day (QID) | ORAL | Status: DC | PRN

## 2022-09-30 NOTE — Assessment & Plan Note (Signed)
Continue statin. 

## 2022-09-30 NOTE — Assessment & Plan Note (Signed)
Rate controlled at present Continue home Eliquis pending angiography

## 2022-09-30 NOTE — Assessment & Plan Note (Signed)
Followed by Dr. 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

## 2022-09-30 NOTE — ED Provider Notes (Signed)
Care assumed of patient from outgoing provider.  See their note for initial history, exam and plan.  Clinical Course as of 09/30/22 2956  Tue Sep 30, 2022  2130 Presents to the emergency department with severe nausea, vomiting and dizziness.  Last known well 10 PM before going to sleep last night.  Does have a history of atrial fibrillation and has been intermittently compliant with Eliquis, did take a dose last night.  Teleneurology consulted for code stroke.  CTA perfusion obtained. [SM]    Clinical Course User Index [SM] Corena Herter, MD     Corena Herter, MD 09/30/22 [phone]

## 2022-09-30 NOTE — Assessment & Plan Note (Signed)
BP stable Titrate home regimen 

## 2022-09-30 NOTE — Assessment & Plan Note (Addendum)
Positive recurrent dizziness over the past 1 to 2 days CT head, CT of the head neck grossly stable though there is some concern for possible missed dissection Case pulmonary discussed with Dr. Augustin Schooling and Dr. Wilford Corner Pending MRA of the head without contrast, MRA of the neck with and without contrast to better assess Some concern for vertiginous symptoms-getting trial of meclizine Otherwise plan to observe overnight Follow closely

## 2022-09-30 NOTE — ED Triage Notes (Signed)
Pt comes By EMS from home for n/v and dizziness. Pt has a hx of a-fib and dizziness but has not been this bad. Pt is A&Ox4.   EMS 4mg  of zofran by IV.

## 2022-09-30 NOTE — Consult Note (Signed)
[facility] Tele[facility]urology Consult Services  Stat Consult  Patient Name:   Jason Gates, Jason Gates Date of Birth:   11/18/1940 Identification Number:   MRN - [id number] Date of Service:   09/30/2022 06:22:47  Diagnosis:       I63.89 - Cerebrovascular accident (CVA) due to other mechanism [facility])  Impression Jason Gates is a 82 yo M w/ a hx of afib and CAD who presents with dizziness. Exam notable for nystagmus with lateral gaze and mild dysathria. Head CT showed no acute intracranial pathology. CTA pending. Presentation is concerning for acute posterior circulation ischemic stroke. Out of the window for thrombolytics. Recommend the following: - continue ASA - hold Eliquis until MRI brain w/o contrast, can resume if no acute infarct. If acute infarct, will need Neurology to review imaging and determine timing of resuming Eliquis - Permissive HTN (SBP <200) until 2300 - NPO until SLP eval - MRI brain w/o contrast - Telemetry - TTE - hemoglobin A1c, lipid panel - PT/OT/SLP - Neurology to follow    Recommendations: Our recommendations are outlined below.  Nursing Recommendations : IV Fluids, avoid dextrose containing fluids, Maintain euglycemiaNeuro checks q4 hrs x 24 hrs and then per shiftHead of bed 30 degreesContinue with Telemetry  Consultations : Recommend Speech therapy if failed dysphagia screenPhysical therapy/Occupational therapy  Disposition : Neurology will follow   ----------------------------------------------------------------------------------------------------   Advanced Imaging: CTA Head and Neck Completed.  LVO:No  Patient in not a candidate for NIR    Metrics: [facility] Notification Time: 09/30/2022 06:19:10 Stamp Time: 09/30/2022 06:22:47 Callback Response Time: 09/30/2022 78:29:56  Primary Provider Notified of Diagnostic Impression and Management Plan on: 09/30/2022 07:07:00   CT HEAD: As Per Radiologist CT Head Showed No Acute Hemorrhage  or Acute Core Infarct   Imaging Head CT showed no acute intracranial pathology   ----------------------------------------------------------------------------------------------------  Chief Complaint: dizziness  History of Present Illness: Patient is a 82 year old Male. Jason Gates is a 82 yo M w/ a hx of afib and CAD who presents with dizziness. LKN at 2300. Woke up with vertigo and blurry vision. Denies any HA or slurred speech. No history of similar symptoms in the past. Patient states he is compliant with his Eliquis.   Past Medical History:      Atrial Fibrillation  Medications:  Anticoagulant use:  Yes Eliquis Antiplatelet use: Yes ASA Reviewed EMR for current medications  Allergies:  Reviewed  Social History: Smoking: No Alcohol Use: No Drug Use: No  Family History:  There is no family history of premature cerebrovascular disease pertinent to this consultation  ROS : 14 Points Review of Systems was performed and was negative except mentioned in HPI.  Past Surgical History: There Is No Surgical History Contributory To Today's Visit   Examination: BP(190/100), Pulse(85), Blood Glucose(148) 1A: Level of Consciousness - Arouses to minor stimulation + 1 1B: Ask Month and Age - Both Questions Right + 0 1C: Blink Eyes & Squeeze Hands - Performs Both Tasks + 0 2: Test Horizontal Extraocular Movements - Partial Gaze Palsy: Can Be Overcome + 1 3: Test Visual Fields - No Visual Loss + 0 4: Test Facial Palsy (Use Grimace if Obtunded) - Normal symmetry + 0 5A: Test Left Arm Motor Drift - No Drift for 10 Seconds + 0 5B: Test Right Arm Motor Drift - No Drift for 10 Seconds + 0 6A: Test Left Leg Motor Drift - No Drift for 5 Seconds + 0 6B: Test Right Leg Motor Drift - No Drift for 5  Seconds + 0 7: Test Limb Ataxia (FNF/Heel-Shin) - No Ataxia + 0 8: Test Sensation - Normal; No sensory loss + 0 9: Test Language/Aphasia - Normal; No aphasia + 0 10: Test Dysarthria -  Mild-Moderate Dysarthria: Slurring but can be understood + 1 11: Test Extinction/Inattention - No abnormality + 0  NIHSS Score: 3  Spoke with : ED Provider    This consult was conducted in real time using interactive audio and Immunologist. Patient was informed of the technology being used for this visit and agreed to proceed. Patient located in hospital and provider located at home/office setting.  Patient is being evaluated for possible acute neurologic impairment and high probability of imminent or life - threatening deterioration.I spent total of 35 minutes providing care to this patient, including time for face to face visit via telemedicine, review of medical records, imaging studies and discussion of findings with providers, the patient and / or family.   Dr Vicente Masson   [facility] For Inpatient follow-up with [facility] physician please call [facility] [phone]. This is not an outpatient service. Post hospital discharge, please contact hospital directly.  Please do not communicate with [facility] physicians via secure chat. If you have any questions, Please contact [facility]. Please call or reconsult our service if there are any clinical or diagnostic changes.

## 2022-09-30 NOTE — ED Notes (Signed)
called to telespecialist per MD Bradler/rep:Subhan.

## 2022-09-30 NOTE — H&P (Addendum)
History and Physical    Patient: Jason Gates. WUJ:[id number] DOB: Oct 23, 1940 DOA: 09/30/2022 DOS: the patient was seen and examined on 09/30/2022 PCP: Carren Rang, PA-C  Patient coming from: Home  Chief Complaint:  Chief Complaint  Patient presents with   Nausea    vom   Emesis   Dizziness   HPI: Jason Flaugh. is a 82 y.o. male with medical history significant of coronary artery disease, atrial fibrillation, stage III CKD, hypertension, hyperlipidemia presenting with dizziness.  History primarily from patient's son.  Per report, patient with generalized dizziness over the past day or so.  Woke up this morning with generalized dizziness as well as left-sided ear ringing.  Mild vertiginous symptoms.  No reported focal hemiparesis or confusion.  No dysarthria or slurred speech.  Son does report patient having some decreased p.o. intake as he is try to lose weight.  He is somewhat concerned about dehydration.  No abdominal pain.  No diarrhea.  No known prior history of CVA in the past.  Positive nausea and vomiting.  No clear formal diagnosis of vertigo in the past. Presented to the ER afebrile, blood pressures 150s to 190s over 80s to 100s.  Satting low 90s on room air.  Transition to 2 L nasal cannula.  White count 9.2, hemoglobin 16, platelets 227, troponin 13, creatinine 1.1.  CT head, CT of the head and neck grossly stable apart from mild to moderate bilateral carotid artery atherosclerosis.  Of the brain negative for any acute infarct.  There was concern for possible missed dissection because of poor image quality.  In conversation with Dr. Augustin Schooling, Dr. Jerrell Belfast with neurology recommending MRI of the brain without contrast as well as MRA of the neck with and without contrast to better assess.  Started on trial of meclizine in the ER. Review of Systems: As mentioned in the history of present illness. All other systems reviewed and are negative. Past Medical History:  Diagnosis Date    Atrial fibrillation (HCC)    Bladder cancer (HCC)    Chronic kidney disease (CKD)    Coronary artery disease    Past Surgical History:  Procedure Laterality Date   CARDIAC SURGERY     CHOLECYSTECTOMY     COLONOSCOPY WITH PROPOFOL N/A 04/21/2022   Procedure: COLONOSCOPY WITH PROPOFOL;  Surgeon: Regis Bill, MD;  Location: [facility] ENDOSCOPY;  Service: Endoscopy;  Laterality: N/A;   CORONARY ANGIOPLASTY     "two stents in 2007"   ESOPHAGOGASTRODUODENOSCOPY (EGD) WITH PROPOFOL N/A 04/21/2022   Procedure: ESOPHAGOGASTRODUODENOSCOPY (EGD) WITH PROPOFOL;  Surgeon: Regis Bill, MD;  Location: [facility] ENDOSCOPY;  Service: Endoscopy;  Laterality: N/A;   EYE SURGERY     NASAL SEPTUM SURGERY     Social History:  reports that he quit smoking about 48 years ago. His smoking use included cigarettes. He started smoking about 63 years ago. He has a 15 pack-year smoking history. He does not have any smokeless tobacco history on file. He reports that he does not currently use alcohol. He reports that he does not use drugs.  Allergies  Allergen Reactions   K Phos Mono-Sod Phos Di & Mono Diarrhea   Sulfur Dioxide Hives   Elemental Sulfur Other (See Comments)    intolerance   Phosphorus Other (See Comments)    intolerance   Potassium Phosphate     Other reaction(s): Diarrhea   Amoxicillin Rash and Hives    Family History  Problem Relation Age of Onset  Obesity Mother    Diabetes Father    Heart attack Father    Heart disease Father     Prior to Admission medications   Medication Sig Start Date End Date Taking? Authorizing Provider  amLODipine (NORVASC) 5 MG tablet Take 5 mg by mouth at bedtime. 04/26/15  Yes [provider]  aspirin 81 MG tablet Take 81 mg by mouth at bedtime.   Yes [provider]  carboxymethylcellulose (REFRESH PLUS) 0.5 % SOLN Place 1 drop into the right eye daily as needed (dry eyes).   Yes [provider]  Cholecalciferol (VITAMIN D)  125 MCG (5000 UT) CAPS Take 5,000 Units by mouth daily.   Yes [provider]  ELIQUIS 2.5 MG TABS tablet TAKE 1 TABLET TWICE A DAY 08/04/22  Yes End, Cristal Deer, MD  Nutritional Supplements (BLADDER 2.2 PO) Take 1 tablet by mouth daily.   Yes [provider]  pantoprazole (PROTONIX) 40 MG tablet Take 1 tablet (40 mg total) by mouth at bedtime. 01/23/22 01/24/23 Yes End, Cristal Deer, MD  simvastatin (ZOCOR) 20 MG tablet Take 20 mg by mouth daily.   Yes [provider]  valACYclovir (VALTREX) 500 MG tablet Take 500 mg by mouth at bedtime. 06/12/15  Yes [provider]  valsartan-hydrochlorothiazide (DIOVAN-HCT) 80-12.5 MG tablet Take 1 tablet by mouth daily. 06/12/22  Yes [provider]  alfuzosin (UROXATRAL) 10 MG 24 hr tablet Take 1 tablet (10 mg total) by mouth daily with breakfast. Patient not taking: Reported on 09/30/2022 09/24/22   [name], MD  fluticasone [facility]) 50 MCG/ACT nasal spray Place 1 spray into both nostrils daily. Patient not taking: Reported on 09/30/2022    [provider]  sildenafil (REVATIO) 20 MG tablet Take 1 to 5 tablets daily as needed 1 hour prior to intercourse Patient not taking: Reported on 09/30/2022 09/24/22   [name], MD    Physical Exam: Vitals:   09/30/22 0815 09/30/22 0816 09/30/22 1030 09/30/22 1100  BP: (!) 157/103   (!) 185/91  Pulse: 71 72 70 65  Resp: 17 17 18 15   Temp: 97.8 F (36.6 C)  (!) 97.1 F (36.2 C)   TempSrc: Oral  Oral   SpO2: 99% 99% 98% 100%  Weight:      Height:       Physical Exam Constitutional:      Appearance: He is normal weight.  HENT:     Head: Normocephalic.     Nose: Nose normal.     Mouth/Throat:     Mouth: Mucous membranes are moist.  Eyes:     Pupils: Pupils are equal, round, and reactive to light.  Cardiovascular:     Rate and Rhythm: Normal rate and regular rhythm.  Pulmonary:     Effort: Pulmonary effort is normal.  Abdominal:     General:  Bowel sounds are normal.  Musculoskeletal:        General: Normal range of motion.  Skin:    General: Skin is warm.  Neurological:     General: No focal deficit present.  Psychiatric:        Mood and Affect: Mood normal.     Data Reviewed:  There are no new results to review at this time. CT CEREBRAL PERFUSION W CONTRAST CLINICAL DATA:  82 year old male with nausea vomiting, dizziness. Atrial fibrillation.  EXAM: CT PERFUSION BRAIN  TECHNIQUE: Multiphase CT imaging of the brain was performed following IV bolus contrast injection. Subsequent parametric perfusion maps were calculated using  RAPID software.  RADIATION DOSE REDUCTION: This exam was performed according to the departmental dose-optimization program which includes automated exposure control, adjustment of the mA and/or kV according to patient size and/or use of iterative reconstruction technique.  CONTRAST:  90mL OMNIPAQUE IOHEXOL 350 MG/ML SOLN  COMPARISON:  Plain head CT today 0535 hours, and CTA head and neck reported separately.  FINDINGS: ASPECTS: 10 (asymmetric white matter disease on earlier CT).  CBF (<30%) Volume: 0mL  Perfusion (Tmax>6.0s) volume: 0mL  And no perfusion parameter abnormality is detected.  Mismatch Volume: Not applicable  Infarction Location:Not applicable  IMPRESSION: Negative CT Brain Perfusion exam.  CTA is reported separately.  Electronically Signed   By: Odessa Fleming M.D.   On: 09/30/2022 07:28 CT ANGIO HEAD NECK W WO CM CLINICAL DATA:  82 year old male with nausea vomiting, dizziness. Atrial fibrillation.  EXAM: CT ANGIOGRAPHY HEAD AND NECK  TECHNIQUE: Multidetector CT imaging of the head and neck was performed using the standard protocol during bolus administration of intravenous contrast. Multiplanar CT image reconstructions and MIPs were obtained to evaluate the vascular anatomy. Carotid stenosis measurements (when applicable) are obtained utilizing  NASCET criteria, using the distal internal carotid diameter as the denominator.  RADIATION DOSE REDUCTION: This exam was performed according to the departmental dose-optimization program which includes automated exposure control, adjustment of the mA and/or kV according to patient size and/or use of iterative reconstruction technique.  CONTRAST:  90mL OMNIPAQUE IOHEXOL 350 MG/ML SOLN in conjunction with CT brain perfusion reported separately.  COMPARISON:  CT head today 0535 hours. CT Perfusion reported separately.  FINDINGS: CTA NECK  Skeleton: Absent maxillary dentition. Cervical spine degenerative endplate spurring. No acute osseous abnormality identified.  Upper chest: Negative.  Other neck: Negative.  Aortic arch: Early contrast timing (pulmonary artery dominant). Calcified aortic atherosclerosis. 3 vessel arch configuration.  Suboptimal CTA contrast in the neck.  Right carotid system: Appears patent with mild to moderate calcified more so than soft plaque at the right carotid bifurcation. Tortuosity distal to the bulb. No significant stenosis is evident.  Left carotid system: Suboptimal contrast combined with dense left subclavian venous contrast streak artifact limits evaluation of the proximal left CCA. The mid and distal left CCA appear patent. There is moderate soft and calcified plaque of the left ICA origin and bulb. But the left ICA remains patent to the skull base with less than 50 % stenosis with respect to the distal vessel.  Vertebral arteries: Limited. Both cervical vertebral arteries are patent and appear fairly codominant. No hemodynamically significant vertebral artery stenosis is identified.  CTA HEAD  Early intracranial contrast timing limits evaluation of all 2nd order and distal circle-of-Willis branches.  Posterior circulation: Codominant distal vertebral arteries and vertebrobasilar junction appear patent with no significant  stenosis. Patent basilar artery. Patent basilar tip and PCA origins. SCA origins also appear patent. Posterior communicating arteries are diminutive or absent. P1 and P2 segments appear symmetric and within normal limits.  Anterior circulation: Both ICA siphons are patent. Left siphon mild calcified atherosclerosis without stenosis. Right siphon mild to moderate calcified atherosclerosis with only mild cavernous segment stenosis.  Patent carotid termini, MCA and ACA origins. A1 and A2 segments are within normal limits. Diminutive or absent anterior communicating artery. Left MCA M1 segment and bifurcation are patent without stenosis. Right MCA M1 segment and bifurcation are patent without stenosis. Limited evaluation of bilateral MCA branches due to early contrast timing. Symmetric visible MCA branch density.  Venous sinuses: Not evaluated due to early  contrast timing.  Anatomic variants: None significant.  Review of the MIP images confirms the above findings  IMPRESSION: 1. CTA limited by early contrast timing. No large vessel occlusion is identified. CT Perfusion is reported separately. 2. Mild to moderate bilateral carotid artery atherosclerosis at the bifurcations and at the skull base. No hemodynamically significant stenosis is evident. 3.  Aortic Atherosclerosis (ICD10-I70.0).  Electronically Signed   By: Odessa Fleming M.D.   On: 09/30/2022 07:36 MR Brain Wo Contrast (neuro protocol) CLINICAL DATA:  Neuro deficit, acute, stroke suspected dizziness.  EXAM: MRI HEAD WITHOUT CONTRAST  TECHNIQUE: Multiplanar, multiecho pulse sequences of the brain and surrounding structures were obtained without intravenous contrast.  COMPARISON:  Same day CT head and head and neck angiogram  FINDINGS: Brain: Negative for an acute infarct. No hemorrhage. No hydrocephalus. No extra-axial fluid collection. No mass effect. No mass lesion. Sequela of mild chronic microvascular ischemic  change. 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  Vascular: Normal flow voids.  Skull and upper cervical spine: Normal marrow signal.  Sinuses/Orbits: Small bilateral mastoid effusions. No middle ear effusion. Mild thickening in the bilateral ethmoid sinuses. Lens replacement. Orbits are otherwise unremarkable.  Other: None.  IMPRESSION: Negative for an acute infarct. No specific etiology for dizziness identified.  Electronically Signed   By: Lorenza Cambridge M.D.   On: 09/30/2022 07:50 CT Head Wo Contrast CLINICAL DATA:  82 year old male with nausea vomiting, dizziness. Atrial fibrillation.  EXAM: CT HEAD WITHOUT CONTRAST  TECHNIQUE: Contiguous axial images were obtained from the base of the skull through the vertex without intravenous contrast.  RADIATION DOSE REDUCTION: This exam was performed according to the departmental dose-optimization program which includes automated exposure control, adjustment of the mA and/or kV according to patient size and/or use of iterative reconstruction technique.  COMPARISON:  None.  FINDINGS: Brain: Cerebral volume is within normal limits for age. No midline shift, ventriculomegaly, mass effect, evidence of mass lesion, intracranial hemorrhage or evidence of cortically based acute infarction.  Asymmetric Patchy and confluent posterior right hemisphere, right periatrial white matter hypodensity. Mild contralateral left periventricular white matter heterogeneity. Other gray-white differentiation, including in the posterior fossa, within normal limits.  Vascular: No suspicious intracranial vascular hyperdensity. Calcified atherosclerosis at the skull base.  Skull: No acute osseous abnormality identified.  Sinuses/Orbits: Mild mostly ethmoid sinus mucosal thickening. No sinus fluid levels. Tympanic cavities and mastoids are well aerated.  Other: Postoperative changes to both globes. No acute orbit or  scalp soft tissue finding.  IMPRESSION: 1. No acute intracranial abnormality identified. 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 2. Mild paranasal sinus disease.  Electronically Signed   By: Odessa Fleming M.D.   On: 09/30/2022 05:45  Lab Results  Component Value Date   WBC 9.2 09/30/2022   HGB 16.0 09/30/2022   HCT 48.9 09/30/2022   MCV 103.2 (H) 09/30/2022   PLT 227 09/30/2022   Last metabolic panel Lab Results  Component Value Date   GLUCOSE 148 (H) 09/30/2022   NA 141 09/30/2022   K 3.4 (L) 09/30/2022   CL 103 09/30/2022   CO2 28 09/30/2022   BUN 27 (H) 09/30/2022   CREATININE 1.81 (H) 09/30/2022   GFRNONAA 37 (L) 09/30/2022   CALCIUM 9.5 09/30/2022   PROT 7.6 09/30/2022   ALBUMIN 4.7 09/30/2022   LABGLOB 2.7 01/31/2021   AGRATIO 1.5 01/31/2021   BILITOT 1.1 09/30/2022  ALKPHOS 61 09/30/2022   AST 44 (H) 09/30/2022   ALT 34 09/30/2022   ANIONGAP 10 09/30/2022    Assessment and Plan: * Dizziness Positive recurrent dizziness over the past 1 to 2 days CT head, CT of the head neck grossly stable though there is some concern for possible missed dissection Case pulmonary discussed with Dr. Augustin Schooling and Dr. Wilford Corner Pending MRA of the head without contrast, MRA of the neck with and without contrast to better assess Some concern for vertiginous symptoms-getting trial of meclizine Otherwise plan to observe overnight Follow closely   Stage 3b chronic kidney disease (CKD) (HCC) Creatinine 1.8 today with GFR in 30s Appears near baseline Monitor  Gastroesophageal reflux disease PPI  Essential hypertension BP stable Titrate home regimen  Hyperlipidemia LDL goal <70 Continue statin  Paroxysmal atrial fibrillation (HCC) Rate controlled at present Continue home Eliquis pending angiography  Coronary artery disease involving native coronary artery of native heart without angina  pectoris Followed by Dr. 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  Cont asa and eliquis       Advance Care Planning:   Code Status: Full Code   Consults: Curbside discussion w/ neurology- formal consult as clinically indicated.   Family Communication: Son at the bedside   Severity of Illness: The appropriate patient status for this patient is OBSERVATION. Observation status is judged to be reasonable and necessary in order to provide the required intensity of service to ensure the patient's safety. The patient's presenting symptoms, physical exam findings, and initial radiographic and laboratory data in the context of their medical condition is felt to place them at decreased risk for further clinical deterioration. Furthermore, it is anticipated that the patient will be medically stable for discharge from the hospital within 2 midnights of admission.   Author: Floydene Flock, MD 09/30/2022 11:57 AM  For on call review [email or web address].

## 2022-09-30 NOTE — Assessment & Plan Note (Signed)
Creatinine 1.8 today with GFR in 30s Appears near baseline Monitor

## 2022-09-30 NOTE — ED Provider Notes (Signed)
[facility] Provider Note   Event Date/Time   First MD Initiated Contact with Patient 09/30/22 [phone]     (approximate) History  Nausea (vom), Emesis, and Dizziness  HPI Jason Gates. is a 82 y.o. male with a past medical history of atrial fibrillation on Eliquis who presents complaining of vertigo, nausea, and vomiting that began upon awakening just prior to arrival.  Patient states that when he went to bed at 11:00PM last night he was in his normal state of health until he woke just prior to arrival at approximately 5 AM with the sensation of the room spinning and associated nausea/vomiting that have not been relieved by 4 mg Zofran IV via EMS en route. ROS: Patient currently denies any vision changes, tinnitus, difficulty speaking, facial droop, sore throat, chest pain, shortness of breath, abdominal pain, diarrhea, dysuria, or weakness/numbness/paresthesias in any extremity   Physical Exam  Triage Vital Signs: ED Triage Vitals  Encounter Vitals Group     BP      Systolic BP Percentile      Diastolic BP Percentile      Pulse      Resp      Temp      Temp src      SpO2      Weight      Height      Head Circumference      Peak Flow      Pain Score      Pain Loc      Pain Education      Exclude from Growth Chart    Most recent vital signs: Vitals:   10/01/22 0452 10/01/22 0826  BP:  128/68  Pulse:  (!) 57  Resp:  18  Temp:  97.6 F (36.4 C)  SpO2: 95% 95%   General: Awake, oriented x4. CV:  Good peripheral perfusion.  Resp:  Normal effort.  Abd:  No distention.  Other:  [name] laying in bed in mild distress secondary to nausea.  Nonextinguishing lateral nystagmus  ED Results / Procedures / Treatments  Labs (all labs ordered are listed, but only abnormal results are displayed) Labs Reviewed  CBC WITH DIFFERENTIAL/PLATELET - Abnormal; Notable for the following components:      Result Value   MCV 103.2 (*)     All other components within normal limits  COMPREHENSIVE METABOLIC PANEL - Abnormal; Notable for the following components:   Potassium 3.4 (*)    Glucose, Bld 148 (*)    BUN 27 (*)    Creatinine, Ser 1.81 (*)    AST 44 (*)    GFR, Estimated 37 (*)    All other components within normal limits  CBC - Abnormal; Notable for the following components:   RBC 3.94 (*)    All other components within normal limits  COMPREHENSIVE METABOLIC PANEL - Abnormal; Notable for the following components:   Glucose, Bld 101 (*)    BUN 27 (*)    Creatinine, Ser 1.71 (*)    Calcium 8.4 (*)    Total Protein 6.1 (*)    GFR, Estimated 39 (*)    All other components within normal limits  GLUCOSE, CAPILLARY - Abnormal; Notable for the following components:   Glucose-Capillary 107 (*)    All other components within normal limits  PROTIME-INR  TROPONIN I (HIGH SENSITIVITY)  TROPONIN I (HIGH SENSITIVITY)   EKG ED ECG REPORT I, Merwyn Katos, the attending physician,  personally viewed and interpreted this ECG. Date: 09/30/2022 EKG Time: 0522 Rate: 83 Rhythm: Atrial fibrillation QRS Axis: normal Intervals: normal ST/T Wave abnormalities: normal Narrative Interpretation: Atrial fibrillation.  No evidence of acute ischemia RADIOLOGY ED MD interpretation: 2 view chest x-ray interpreted by me shows no evidence of acute abnormalities including no pneumonia, pneumothorax, or widened mediastinum -Agree with radiology assessment Official radiology report(s): DG Chest 2 View  Result Date: 10/04/2022 CLINICAL DATA:  Chest pain on the left, initial encounter EXAM: CHEST - 2 VIEW COMPARISON:  11/25/2021 FINDINGS: Cardiac shadow is within normal limits. Lungs are well aerated bilaterally. Mild scarring is noted in the right base stable from the prior study. No new focal infiltrate is seen. No bony abnormality is noted. IMPRESSION: Mild scarring in the right base stable from the prior exam. Electronically Signed   By:  Alcide Clever M.D.   On: 10/04/2022 21:47   PROCEDURES: Critical Care performed: No Procedures MEDICATIONS ORDERED IN ED: Medications  ondansetron (ZOFRAN) injection 4 mg (4 mg Intravenous Given 09/30/22 0544)  sodium chloride 0.9 % bolus 1,000 mL (0 mLs Intravenous Stopped 09/30/22 1053)  LORazepam (ATIVAN) injection 0.5 mg (0.5 mg Intravenous Given 09/30/22 0628)  iohexol (OMNIPAQUE) 350 MG/ML injection 90 mL (90 mLs Intravenous Contrast Given 09/30/22 0708)  meclizine (ANTIVERT) tablet 50 mg (50 mg Oral Given 09/30/22 1024)  gadobutrol (GADAVIST) 1 MMOL/ML injection 10 mL (10 mLs Intravenous Contrast Given 09/30/22 1222)   IMPRESSION / MDM / ASSESSMENT AND PLAN / ED COURSE  I reviewed the triage vital signs and the nursing notes.                             The patient is on the cardiac monitor to evaluate for evidence of arrhythmia and/or significant heart rate changes. Patient's presentation is most consistent with acute presentation with potential threat to life or bodily function. 82 year old male presents for vertigo with associated nausea/vomiting PMH risk factors: Hypercholesterolemia, hypertension Neurologic Deficits: Vertigo Last known Well Time: 2300 09/29/22 NIH Stroke Score: 1 Given History and Exam I have lower suspicion for infectious etiology, neurologic changes secondary to toxicologic ingestion, seizure, complex migraine. Presentation concerning for possible stroke requiring workup.  Workup: Labs: POC glucose, CBC, BMP, LFTs, Troponin, PT/INR, PTT, Type and Screen Other Diagnostics: ECG, CXR, non-contrast head CT followed by CTA brain and neck (to r/o large vessel occlusion amenable to thrombectomy) Interventions: Patient low on NIH scale and out of the window for tpa.  Consult: Neurology.  Pending Disposition: Care of this patient will be signed out to the oncoming physician at the end of my shift.  All pertinent patient information conveyed and all questions answered.  All  further care and disposition decisions will be made by the oncoming physician. Clinical Course as of 10/05/22 0826  Tue Sep 30, 2022  1478 Presents to the emergency department with severe nausea, vomiting and dizziness.  Last known well 10 PM before going to sleep last night.  Does have a history of atrial fibrillation and has been intermittently compliant with Eliquis, did take a dose last night.  Teleneurology consulted for code stroke.  CTA perfusion obtained. [SM]    Clinical Course User Index [SM] Corena Herter, MD   FINAL CLINICAL IMPRESSION(S) / ED DIAGNOSES   Final diagnoses:  Vertigo   Rx / DC Orders   ED Discharge Orders          Ordered  meclizine (ANTIVERT) 25 MG tablet  3 times daily PRN        10/01/22 1028    Increase activity slowly        10/01/22 1028    Diet - low sodium heart healthy        10/01/22 1028    Discharge instructions       Comments: It was pleasure taking care of you. Please keep yourself well-hydrated and follow-up with ENT. You can use meclizine as needed if recurrence of vertigo   10/01/22 1028           Note:  This document was prepared using Dragon voice recognition software and may include unintentional dictation errors.   Merwyn Katos, MD 10/05/22 [phone]

## 2022-09-30 NOTE — ED Provider Notes (Addendum)
Care assumed of patient from outgoing provider.  See their note for initial history, exam and plan.  Clinical Course as of 09/30/22 1000  Tue Sep 30, 2022  4782 Presents to the emergency department with severe nausea, vomiting and dizziness.  Last known well 10 PM before going to sleep last night.  Does have a history of atrial fibrillation and has been intermittently compliant with Eliquis, did take a dose last night.  Teleneurology consulted for code stroke.  CTA perfusion obtained. [SM]    Clinical Course User Index [SM] Corena Herter, MD   Discussed with radiologist and also discussed with teleneurology Dr. Toy Baker -unable to 100% sure states that he does not have a dissection.  No large vessel cutoff.  Normal MRI.  After discussion with teleneurologist recommended if he is feeling much better and able to ambulate going home with treatment for peripheral vertigo otherwise if ongoing vertiginous symptoms recommended admission for further evaluation and possible repeat CTA.  On reevaluation with ambulation with nursing staff patient became very nauseous, dizzy and had gait instability with ambulation.  Consulted hospitalist for admission.  Discussed with neurology Dr. Jerrell Belfast -recommended treatment for peripheral vertigo.  If ongoing symptoms recommended MRA head, MRA neck with and without contrast to further evaluate for possible dissection.  Given meclizine.  Ongoing vertiginous symptoms and gait instability, evaluated by Dr. Alvester Morin and placed for admission for ongoing dizziness   Corena Herter, MD 09/30/22 1001    Corena Herter, MD 09/30/22 1103

## 2022-09-30 NOTE — Assessment & Plan Note (Signed)
PPI ?

## 2022-10-01 DIAGNOSIS — I480 Paroxysmal atrial fibrillation: Secondary | ICD-10-CM

## 2022-10-01 DIAGNOSIS — R42 Dizziness and giddiness: Secondary | ICD-10-CM | POA: Diagnosis not present

## 2022-10-01 DIAGNOSIS — N1832 Chronic kidney disease, stage 3b: Secondary | ICD-10-CM

## 2022-10-01 DIAGNOSIS — I10 Essential (primary) hypertension: Secondary | ICD-10-CM | POA: Diagnosis not present

## 2022-10-01 LAB — GLUCOSE, CAPILLARY: Glucose-Capillary: 107 mg/dL — ABNORMAL HIGH (ref 70–99)

## 2022-10-01 MED ORDER — MECLIZINE HCL 25 MG PO TABS: 25.0000 mg | ORAL_TABLET | Freq: Three times a day (TID) | ORAL | 0 refills | Status: DC | PRN

## 2022-10-01 NOTE — Discharge Summary (Signed)
Physician Discharge Summary   Patient: Jason Gates. MRN: [id number] DOB: 01/28/1941  Admit date:     09/30/2022  Discharge date: 10/01/22  Discharge Physician: Arnetha Courser   PCP: Carren Rang, PA-C   Recommendations at discharge:  Follow-up with primary care provider Follow-up with ENT  Discharge Diagnoses: Principal Problem:   Dizziness Active Problems:   Coronary artery disease involving native coronary artery of native heart without angina pectoris   Paroxysmal atrial fibrillation (HCC)   Hyperlipidemia LDL goal <70   Essential hypertension   Gastroesophageal reflux disease   Stage 3b chronic kidney disease (CKD) [facility])   Hospital Course: Taken from H&P.  Jason Gates. is a 82 y.o. male with medical history significant of coronary artery disease, atrial fibrillation, stage III CKD, hypertension, hyperlipidemia presenting with dizziness with associated nausea and vomiting.  Also having left ear ringing.  Symptoms more like vertigo.  On presentation hemodynamically stable.  Labs mostly unremarkable.CT head, CT of the head and neck grossly stable apart from mild to moderate bilateral carotid artery atherosclerosis. Of the brain negative for any acute infarct. There was concern for possible missed dissection because of poor image quality. In conversation with Dr. Augustin Schooling, Dr. Jerrell Belfast with neurology recommending MRI of the brain without contrast as well as MRA of the neck with and without contrast to better assess. Started on trial of meclizine in the ER.   8/7: Vital and labs stable.  MRA of head and neck with no significant stenosis or occlusion.  No dissection.  MRI brain was negative for any acute infarct or any specific etiology for dizziness. PT for vestibular rehab was ordered.  Patient improved with resolution of vertigo.  He was having ringing in ear for a long time which normally get worse intermittently.  Patient was advised to follow-up with ENT as outpatient  for further recommendations.  He is provided with meclizine to use as needed for vertigo.  He will continue with rest of his home medications and need to have a follow-up with his providers for further  management.    Assessment and Plan: * Dizziness Positive recurrent dizziness over the past 1 to 2 days CT head, CT of the head neck grossly stable though there is some concern for possible missed dissection MRI and MRI of brain was without any significant abnormality. Likely experiencing vertigo which has been resolved now -Follow-up with outpatient ENT -Meclizine as needed  Stage 3b chronic kidney disease (CKD) (HCC) Appears near baseline Monitor  Gastroesophageal reflux disease PPI  Essential hypertension BP stable Titrate home regimen  Hyperlipidemia LDL goal <70 Continue statin  Paroxysmal atrial fibrillation (HCC) Rate controlled at present Continue home Eliquis pending angiography  Coronary artery disease involving native coronary artery of native heart without angina pectoris Followed by Dr. 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  Cont asa and eliquis    Consultants: None Procedures performed: None Disposition: Home Diet recommendation:  Discharge Diet Orders (From admission, onward)     Start     Ordered   10/01/22 0000  Diet - low sodium heart healthy        10/01/22 1028           Cardiac diet DISCHARGE MEDICATION: Allergies as of 10/01/2022       Reactions   K Phos Mono-sod Phos Di & Mono Diarrhea   Sulfur Dioxide Hives   Elemental  Sulfur Other (See Comments)   intolerance   Phosphorus Other (See Comments)   intolerance   Potassium Phosphate    Other reaction(s): Diarrhea   Amoxicillin Rash, Hives        Medication List     STOP taking these medications    alfuzosin 10 MG 24 hr tablet Commonly known as: Uroxatral   fluticasone 50 MCG/ACT nasal  spray Commonly known as: FLONASE       TAKE these medications    amLODipine 5 MG tablet Commonly known as: NORVASC Take 5 mg by mouth at bedtime.   aspirin 81 MG tablet Take 81 mg by mouth at bedtime.   BLADDER 2.2 PO Take 1 tablet by mouth daily.   carboxymethylcellulose 0.5 % Soln Commonly known as: REFRESH PLUS Place 1 drop into the right eye daily as needed (dry eyes).   Eliquis 2.5 MG Tabs tablet Generic drug: apixaban TAKE 1 TABLET TWICE A DAY   meclizine 25 MG tablet Commonly known as: ANTIVERT Take 1 tablet (25 mg total) by mouth 3 (three) times daily as needed for dizziness.   pantoprazole 40 MG tablet Commonly known as: PROTONIX Take 1 tablet (40 mg total) by mouth at bedtime.   sildenafil 20 MG tablet Commonly known as: REVATIO Take 1 to 5 tablets daily as needed 1 hour prior to intercourse   simvastatin 20 MG tablet Commonly known as: ZOCOR Take 20 mg by mouth daily.   valACYclovir 500 MG tablet Commonly known as: VALTREX Take 500 mg by mouth at bedtime.   valsartan-hydrochlorothiazide 80-12.5 MG tablet Commonly known as: DIOVAN-HCT Take 1 tablet by mouth daily.   Vitamin D 125 MCG (5000 UT) Caps Take 5,000 Units by mouth daily.        Follow-up Information     Carren Rang, PA-C. Schedule an appointment as soon as possible for a visit in 1 week(s).   Specialty: Physician Assistant Contact information: [address] [place] [address] [phone]         Linus Salmons, MD. Schedule an appointment as soon as possible for a visit in 1 week(s).   Specialty: Otolaryngology Contact information: [address] [place] [address] [phone]                Discharge Exam: Ceasar Mons Weights   09/30/22 0518  Weight: 106.3 kg   General.  Obese gentleman, in no acute distress. Pulmonary.  Lungs clear bilaterally, normal respiratory effort. CV.  Regular rate and rhythm, no  JVD, rub or murmur. Abdomen.  Soft, nontender, nondistended, BS positive. CNS.  Alert and oriented .  No focal neurologic deficit. Extremities.  No edema, no cyanosis, pulses intact and symmetrical. Psychiatry.  Judgment and insight appears normal.   Condition at discharge: stable  The results of significant diagnostics from this hospitalization (including imaging, microbiology, ancillary and laboratory) are listed below for reference.   Imaging Studies: MR ANGIO HEAD WO CONTRAST  Result Date: 09/30/2022 CLINICAL DATA:  Stroke suspected. EXAM: MRA NECK WITHOUT AND WITH CONTRAST MRA HEAD WITHOUT CONTRAST TECHNIQUE: Multiplanar and multiecho pulse sequences of the neck were obtained without and with intravenous contrast. Angiographic images of the neck were obtained using MRA technique without and with intravenous contrast; Angiographic images of the Circle of Willis were obtained using MRA technique without intravenous contrast. CONTRAST:  10mL GADAVIST GADOBUTROL 1 MMOL/ML IV SOLN COMPARISON:  Same day CTA head/neck. FINDINGS: MRA NECK FINDINGS Aortic arch: The imaged aortic arch  is normal. The origins of the major branch vessels are patent. The subclavian arteries Right carotid system: The right common, internal, and external carotid arteries are patent, without hemodynamically significant stenosis or occlusion. There is no evidence of dissection or aneurysm. Left carotid system: The left common, internal, and external carotid arteries are patent, with mild atherosclerotic irregularity and narrowing but no hemodynamically significant stenosis or occlusion. There is no evidence of dissection or aneurysm. Vertebral arteries: The vertebral arteries are patent with antegrade flow. There is no evidence of hemodynamically significant stenosis or occlusion. There is no evidence of dissection or aneurysm. Other: None. MRA HEAD FINDINGS Anterior circulation: The intracranial ICAs are normal. The bilateral MCAs  and ACAS are normal, with no proximal stenosis or occlusion. The anterior communicating artery is normal. There is no aneurysm or AVM. Posterior circulation: The bilateral V4 segments are normal. The basilar artery is normal. The major cerebellar arteries appear patent. The bilateral PCAs are normal, with no proximal stenosis or occlusion. Diminutive bilateral posterior communicating arteries are identified. There is no aneurysm or AVM. Anatomic variants: None. IMPRESSION: Patent vasculature of the head and neck with no hemodynamically significant stenosis or occlusion. Electronically Signed   By: Lesia Hausen M.D.   On: 09/30/2022 12:42   MR ANGIO NECK W WO CONTRAST  Result Date: 09/30/2022 CLINICAL DATA:  Stroke suspected. EXAM: MRA NECK WITHOUT AND WITH CONTRAST MRA HEAD WITHOUT CONTRAST TECHNIQUE: Multiplanar and multiecho pulse sequences of the neck were obtained without and with intravenous contrast. Angiographic images of the neck were obtained using MRA technique without and with intravenous contrast; Angiographic images of the Circle of Willis were obtained using MRA technique without intravenous contrast. CONTRAST:  10mL GADAVIST GADOBUTROL 1 MMOL/ML IV SOLN COMPARISON:  Same day CTA head/neck. FINDINGS: MRA NECK FINDINGS Aortic arch: The imaged aortic arch is normal. The origins of the major branch vessels are patent. The subclavian arteries Right carotid system: The right common, internal, and external carotid arteries are patent, without hemodynamically significant stenosis or occlusion. There is no evidence of dissection or aneurysm. Left carotid system: The left common, internal, and external carotid arteries are patent, with mild atherosclerotic irregularity and narrowing but no hemodynamically significant stenosis or occlusion. There is no evidence of dissection or aneurysm. Vertebral arteries: The vertebral arteries are patent with antegrade flow. There is no evidence of hemodynamically  significant stenosis or occlusion. There is no evidence of dissection or aneurysm. Other: None. MRA HEAD FINDINGS Anterior circulation: The intracranial ICAs are normal. The bilateral MCAs and ACAS are normal, with no proximal stenosis or occlusion. The anterior communicating artery is normal. There is no aneurysm or AVM. Posterior circulation: The bilateral V4 segments are normal. The basilar artery is normal. The major cerebellar arteries appear patent. The bilateral PCAs are normal, with no proximal stenosis or occlusion. Diminutive bilateral posterior communicating arteries are identified. There is no aneurysm or AVM. Anatomic variants: None. IMPRESSION: Patent vasculature of the head and neck with no hemodynamically significant stenosis or occlusion. Electronically Signed   By: Lesia Hausen M.D.   On: 09/30/2022 12:42   MR Brain Wo Contrast (neuro protocol)  Result Date: 09/30/2022 CLINICAL DATA:  Neuro deficit, acute, stroke suspected dizziness. EXAM: MRI HEAD WITHOUT CONTRAST TECHNIQUE: Multiplanar, multiecho pulse sequences of the brain and surrounding structures were obtained without intravenous contrast. COMPARISON:  Same day CT head and head and neck angiogram FINDINGS: Brain: Negative for an acute infarct. No hemorrhage. No hydrocephalus. No extra-axial fluid collection. No  mass effect. No mass lesion. Sequela of mild chronic microvascular ischemic change. 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 Vascular: Normal flow voids. Skull and upper cervical spine: Normal marrow signal. Sinuses/Orbits: Small bilateral mastoid effusions. No middle ear effusion. Mild thickening in the bilateral ethmoid sinuses. Lens replacement. Orbits are otherwise unremarkable. Other: None. IMPRESSION: Negative for an acute infarct. No specific etiology for dizziness identified. Electronically Signed   By: Lorenza Cambridge M.D.   On: 09/30/2022 07:50   CT ANGIO HEAD NECK W WO CM  Result Date:  09/30/2022 CLINICAL DATA:  82 year old male with nausea vomiting, dizziness. Atrial fibrillation. EXAM: CT ANGIOGRAPHY HEAD AND NECK TECHNIQUE: Multidetector CT imaging of the head and neck was performed using the standard protocol during bolus administration of intravenous contrast. Multiplanar CT image reconstructions and MIPs were obtained to evaluate the vascular anatomy. Carotid stenosis measurements (when applicable) are obtained utilizing NASCET criteria, using the distal internal carotid diameter as the denominator. RADIATION DOSE REDUCTION: This exam was performed according to the departmental dose-optimization program which includes automated exposure control, adjustment of the mA and/or kV according to patient size and/or use of iterative reconstruction technique. CONTRAST:  90mL OMNIPAQUE IOHEXOL 350 MG/ML SOLN in conjunction with CT brain perfusion reported separately. COMPARISON:  CT head today 0535 hours. CT Perfusion reported separately. FINDINGS: CTA NECK Skeleton: Absent maxillary dentition. Cervical spine degenerative endplate spurring. No acute osseous abnormality identified. Upper chest: Negative. Other neck: Negative. Aortic arch: Early contrast timing (pulmonary artery dominant). Calcified aortic atherosclerosis. 3 vessel arch configuration. Suboptimal CTA contrast in the neck. Right carotid system: Appears patent with mild to moderate calcified more so than soft plaque at the right carotid bifurcation. Tortuosity distal to the bulb. No significant stenosis is evident. Left carotid system: Suboptimal contrast combined with dense left subclavian venous contrast streak artifact limits evaluation of the proximal left CCA. The mid and distal left CCA appear patent. There is moderate soft and calcified plaque of the left ICA origin and bulb. But the left ICA remains patent to the skull base with less than 50 % stenosis with respect to the distal vessel. Vertebral arteries: Limited. Both cervical  vertebral arteries are patent and appear fairly codominant. No hemodynamically significant vertebral artery stenosis is identified. CTA HEAD Early intracranial contrast timing limits evaluation of all 2nd order and distal circle-of-Willis branches. Posterior circulation: Codominant distal vertebral arteries and vertebrobasilar junction appear patent with no significant stenosis. Patent basilar artery. Patent basilar tip and PCA origins. SCA origins also appear patent. Posterior communicating arteries are diminutive or absent. P1 and P2 segments appear symmetric and within normal limits. Anterior circulation: Both ICA siphons are patent. Left siphon mild calcified atherosclerosis without stenosis. Right siphon mild to moderate calcified atherosclerosis with only mild cavernous segment stenosis. Patent carotid termini, MCA and ACA origins. A1 and A2 segments are within normal limits. Diminutive or absent anterior communicating artery. Left MCA M1 segment and bifurcation are patent without stenosis. Right MCA M1 segment and bifurcation are patent without stenosis. Limited evaluation of bilateral MCA branches due to early contrast timing. Symmetric visible MCA branch density. Venous sinuses: Not evaluated due to early contrast timing. Anatomic variants: None significant. Review of the MIP images confirms the above findings IMPRESSION: 1. CTA limited by early contrast timing. No large vessel occlusion is identified. CT Perfusion is reported separately. 2. Mild to moderate bilateral carotid artery atherosclerosis at the bifurcations and at the skull base. No hemodynamically significant stenosis is evident. 3.  Aortic Atherosclerosis (ICD10-I70.0). Electronically Signed   By: Odessa Fleming M.D.   On: 09/30/2022 07:36   CT CEREBRAL PERFUSION W CONTRAST  Result Date: 09/30/2022 CLINICAL DATA:  82 year old male with nausea vomiting, dizziness. Atrial fibrillation. EXAM: CT PERFUSION BRAIN TECHNIQUE: Multiphase CT imaging of the  brain was performed following IV bolus contrast injection. Subsequent parametric perfusion maps were calculated using RAPID software. RADIATION DOSE REDUCTION: This exam was performed according to the departmental dose-optimization program which includes automated exposure control, adjustment of the mA and/or kV according to patient size and/or use of iterative reconstruction technique. CONTRAST:  90mL OMNIPAQUE IOHEXOL 350 MG/ML SOLN COMPARISON:  Plain head CT today 0535 hours, and CTA head and neck reported separately. FINDINGS: ASPECTS: 10 (asymmetric white matter disease on earlier CT). CBF (<30%) Volume: 0mL Perfusion (Tmax>6.0s) volume: 0mL And no perfusion parameter abnormality is detected. Mismatch Volume: Not applicable Infarction Location:Not applicable IMPRESSION: Negative CT Brain Perfusion exam.  CTA is reported separately. Electronically Signed   By: Odessa Fleming M.D.   On: 09/30/2022 07:28   CT Head Wo Contrast  Result Date: 09/30/2022 CLINICAL DATA:  82 year old male with nausea vomiting, dizziness. Atrial fibrillation. EXAM: CT HEAD WITHOUT CONTRAST TECHNIQUE: Contiguous axial images were obtained from the base of the skull through the vertex without intravenous contrast. RADIATION DOSE REDUCTION: This exam was performed according to the departmental dose-optimization program which includes automated exposure control, adjustment of the mA and/or kV according to patient size and/or use of iterative reconstruction technique. COMPARISON:  None. FINDINGS: Brain: Cerebral volume is within normal limits for age. No midline shift, ventriculomegaly, mass effect, evidence of mass lesion, intracranial hemorrhage or evidence of cortically based acute infarction. Asymmetric Patchy and confluent posterior right hemisphere, right periatrial white matter hypodensity. Mild contralateral left periventricular white matter heterogeneity. Other gray-white differentiation, including in the posterior fossa, within normal  limits. Vascular: No suspicious intracranial vascular hyperdensity. Calcified atherosclerosis at the skull base. Skull: No acute osseous abnormality identified. Sinuses/Orbits: Mild mostly ethmoid sinus mucosal thickening. No sinus fluid levels. Tympanic cavities and mastoids are well aerated. Other: Postoperative changes to both globes. No acute orbit or scalp soft tissue finding. IMPRESSION: 1. No acute intracranial abnormality identified. 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 2. Mild paranasal sinus disease. Electronically Signed   By: Odessa Fleming M.D.   On: 09/30/2022 05:45   ECHOCARDIOGRAM COMPLETE  Result Date: 09/25/2022    ECHOCARDIOGRAM REPORT   Patient Name:   Jason Gates. Date of Exam: 09/25/2022 Medical Rec #:  [id number]           Height:       68.0 in Accession #:    [id number]          Weight:       234.2 lb Date of Birth:  06/14/1940           BSA:          2.186 m Patient Age:    82 years            BP:           153/79 mmHg Patient Gender: M                   HR:           83 bpm. Exam Location:  Outpatient Procedure: 2D Echo, Cardiac Doppler and Color Doppler Indications:  R60.0 Lower extremity edema  History:        Patient has no prior history of Echocardiogram examinations.                 CAD; Arrythmias:Atrial Fibrillation.  Sonographer:    Darlys Gales Referring Phys: [phone] MANPREET S BHUTANI IMPRESSIONS  1. Left ventricular ejection fraction, by estimation, is 55 to 60%. The left ventricle has normal function. The left ventricle has no regional wall motion abnormalities. Left ventricular diastolic parameters are consistent with Grade I diastolic dysfunction (impaired relaxation).  2. Right ventricular systolic function is normal. The right ventricular size is normal. Tricuspid regurgitation signal is inadequate for assessing PA pressure.  3. The mitral valve is normal in structure. No evidence of  mitral valve regurgitation.  4. The aortic valve is grossly normal. Aortic valve regurgitation is not visualized.  5. The inferior vena cava is normal in size with greater than 50% respiratory variability, suggesting right atrial pressure of 3 mmHg. FINDINGS  Left Ventricle: Left ventricular ejection fraction, by estimation, is 55 to 60%. The left ventricle has normal function. The left ventricle has no regional wall motion abnormalities. The left ventricular internal cavity size was normal in size. There is  no left ventricular hypertrophy. Left ventricular diastolic parameters are consistent with Grade I diastolic dysfunction (impaired relaxation). Right Ventricle: The right ventricular size is normal. Right ventricular systolic function is normal. Tricuspid regurgitation signal is inadequate for assessing PA pressure. Left Atrium: Left atrial size was normal in size. Right Atrium: Right atrial size was normal in size. Pericardium: There is no evidence of pericardial effusion. Mitral Valve: The mitral valve is normal in structure. No evidence of mitral valve regurgitation. Tricuspid Valve: The tricuspid valve is normal in structure. Tricuspid valve regurgitation is not demonstrated. Aortic Valve: The aortic valve is grossly normal. Aortic valve regurgitation is not visualized. Aortic valve mean gradient measures 5.0 mmHg. Aortic valve peak gradient measures 9.1 mmHg. Aortic valve area, by VTI measures 2.35 cm. Pulmonic Valve: The pulmonic valve was normal in structure. Pulmonic valve regurgitation is not visualized. Aorta: The aortic root and ascending aorta are structurally normal, with no evidence of dilitation. Venous: The inferior vena cava is normal in size with greater than 50% respiratory variability, suggesting right atrial pressure of 3 mmHg. IAS/Shunts: The interatrial septum was not well visualized.  LEFT VENTRICLE PLAX 2D LVIDd:         5.20 cm   Diastology LVIDs:         3.80 cm   LV e' medial:     5.98 cm/s LV PW:         0.80 cm   LV E/e' medial:  10.7 LV IVS:        0.90 cm   LV e' lateral:   8.38 cm/s LVOT diam:     1.90 cm   LV E/e' lateral: 7.6 LV SV:         63 LV SV Index:   29 LVOT Area:     2.84 cm  LEFT ATRIUM             Index        RIGHT ATRIUM           Index LA Vol (A2C):   68.4 ml 31.29 ml/m  RA Area:     14.70 cm LA Vol (A4C):   50.9 ml 23.29 ml/m  RA Volume:   34.30 ml  15.69 ml/m LA Biplane Vol: 59.6  ml 27.27 ml/m  AORTIC VALVE AV Area (Vmax):    2.07 cm AV Area (Vmean):   2.09 cm AV Area (VTI):     2.35 cm AV Vmax:           151.00 cm/s AV Vmean:          110.000 cm/s AV VTI:            0.270 m AV Peak Grad:      9.1 mmHg AV Mean Grad:      5.0 mmHg LVOT Vmax:         110.00 cm/s LVOT Vmean:        81.250 cm/s LVOT VTI:          0.224 m LVOT/AV VTI ratio: 0.83  AORTA Ao Root diam: 3.10 cm MITRAL VALVE MV Area (PHT): 3.20 cm     SHUNTS MV Decel Time: 237 msec     Systemic VTI:  0.22 m MV E velocity: 63.90 cm/s   Systemic Diam: 1.90 cm MV A velocity: 109.00 cm/s MV E/A ratio:  0.59 Photographer signed by Carolan Clines Signature Date/Time: 09/25/2022/3:34:00 PM    Final     Microbiology: Results for orders placed or performed in visit on 09/24/22  Microscopic Examination     Status: Abnormal   Collection Time: 09/24/22  1:31 PM   Urine  Result Value Ref Range Status   WBC, UA 0-5 0 - 5 /hpf Final   RBC, Urine 0-2 0 - 2 /hpf Final   Epithelial Cells (non renal) 0-10 0 - 10 /hpf Final   Casts Present (A) None seen /lpf Final   Cast Type Hyaline casts N/A Final   Bacteria, UA Few None seen/Few Final    Labs: CBC: Recent Labs  Lab 09/30/22 0515 10/01/22 0531  WBC 9.2 9.7  NEUTROABS 5.1  --   HGB 16.0 13.3  HCT 48.9 39.2  MCV 103.2* 99.5  PLT 227 192   Basic Metabolic Panel: Recent Labs  Lab 09/30/22 0515 10/01/22 0531  NA 141 142  K 3.4* 3.5  CL 103 107  CO2 28 27  GLUCOSE 148* 101*  BUN 27* 27*  CREATININE 1.81* 1.71*  CALCIUM 9.5 8.4*    Liver Function Tests: Recent Labs  Lab 09/30/22 0515 10/01/22 0531  AST 44* 29  ALT 34 26  ALKPHOS 61 44  BILITOT 1.1 0.7  PROT 7.6 6.1*  ALBUMIN 4.7 3.7   CBG: Recent Labs  Lab 10/01/22 0754  GLUCAP 107*    Discharge time spent: greater than 30 minutes.  This record has been created using Conservation officer, historic buildings. Errors have been sought and corrected,but may not always be located. Such creation errors do not reflect on the standard of care.   Signed: Arnetha Courser, MD [facility] 10/01/2022

## 2022-10-01 NOTE — Hospital Course (Addendum)
Taken from H&P.  Jason Gates. is a 82 y.o. male with medical history significant of coronary artery disease, atrial fibrillation, stage III CKD, hypertension, hyperlipidemia presenting with dizziness with associated nausea and vomiting.  Also having left ear ringing.  Symptoms more like vertigo.  On presentation hemodynamically stable.  Labs mostly unremarkable.CT head, CT of the head and neck grossly stable apart from mild to moderate bilateral carotid artery atherosclerosis. Of the brain negative for any acute infarct. There was concern for possible missed dissection because of poor image quality. In conversation with Dr. Augustin Schooling, Dr. Jerrell Belfast with neurology recommending MRI of the brain without contrast as well as MRA of the neck with and without contrast to better assess. Started on trial of meclizine in the ER.   8/7: Vital and labs stable.  MRA of head and neck with no significant stenosis or occlusion.  No dissection.  MRI brain was negative for any acute infarct or any specific etiology for dizziness. PT for vestibular rehab was ordered.  Patient improved with resolution of vertigo.  He was having ringing in ear for a long time which normally get worse intermittently.  Patient was advised to follow-up with ENT as outpatient for further recommendations.  He is provided with meclizine to use as needed for vertigo.  He will continue with rest of his home medications and need to have a follow-up with his providers for further  management.

## 2022-10-01 NOTE — Plan of Care (Signed)

## 2022-10-03 ENCOUNTER — Telehealth: Payer: Self-pay | Admitting: Urology

## 2022-10-03 NOTE — Telephone Encounter (Signed)
Patient called to report that he was seen in the ER on 09/30/22, and was admitted overnight to the hospital. He said that the ER doctor had discontinued Alfuzosin 10 mg. He said that he actually had not started it because pharmacy did not have it in stock, so he wasn't able to get it. He wanted Dr. Apolinar Junes to know, and to ask what she recommends.

## 2022-10-04 ENCOUNTER — Encounter: Payer: Self-pay | Admitting: Emergency Medicine

## 2022-10-04 ENCOUNTER — Emergency Department: Payer: Medicare Other

## 2022-10-04 ENCOUNTER — Other Ambulatory Visit: Payer: Self-pay

## 2022-10-04 DIAGNOSIS — H9312 Tinnitus, left ear: Secondary | ICD-10-CM | POA: Insufficient documentation

## 2022-10-04 DIAGNOSIS — R002 Palpitations: Secondary | ICD-10-CM | POA: Diagnosis not present

## 2022-10-04 DIAGNOSIS — F419 Anxiety disorder, unspecified: Secondary | ICD-10-CM | POA: Diagnosis not present

## 2022-10-04 LAB — TROPONIN I (HIGH SENSITIVITY)
Troponin I (High Sensitivity): 16 ng/L (ref <18)
Troponin I (High Sensitivity): 16 ng/L (ref <18)

## 2022-10-04 LAB — CBC
HCT: 47.1 % (ref 39.0–52.0)
Hemoglobin: 15.7 g/dL (ref 13.0–17.0)
MCH: 33.4 pg (ref 26.0–34.0)
MCHC: 33.3 g/dL (ref 30.0–36.0)
MCV: 100.2 fL — ABNORMAL HIGH (ref 80.0–100.0)
Platelets: 227 10*3/uL (ref 150–400)
RBC: 4.7 MIL/uL (ref 4.22–5.81)
RDW: 12.9 % (ref 11.5–15.5)
WBC: 9 10*3/uL (ref 4.0–10.5)
nRBC: 0 % (ref 0.0–0.2)

## 2022-10-04 LAB — BASIC METABOLIC PANEL
Anion gap: 8 (ref 5–15)
BUN: 24 mg/dL — ABNORMAL HIGH (ref 8–23)
CO2: 27 mmol/L (ref 22–32)
Calcium: 9.1 mg/dL (ref 8.9–10.3)
Chloride: 104 mmol/L (ref 98–111)
Creatinine, Ser: 1.75 mg/dL — ABNORMAL HIGH (ref 0.61–1.24)
GFR, Estimated: 38 mL/min — ABNORMAL LOW (ref >60)
Glucose, Bld: 132 mg/dL — ABNORMAL HIGH (ref 70–99)
Potassium: 3.6 mmol/L (ref 3.5–5.1)
Sodium: 139 mmol/L (ref 135–145)

## 2022-10-04 NOTE — ED Triage Notes (Signed)
Pt c/o intermittent left sided chest pain/heaviness today. Pt was admitted for vertigo on 8/6. C/o hypertension at home as well.

## 2022-10-05 ENCOUNTER — Emergency Department
Admission: EM | Admit: 2022-10-05 | Discharge: 2022-10-05 | Disposition: A | Discharge status: Home / Self Care | Payer: Medicare Other | Source: Home / Self Care | Attending: Emergency Medicine | Admitting: Emergency Medicine

## 2022-10-05 DIAGNOSIS — F419 Anxiety disorder, unspecified: Secondary | ICD-10-CM

## 2022-10-05 DIAGNOSIS — R002 Palpitations: Secondary | ICD-10-CM

## 2022-10-05 DIAGNOSIS — H9312 Tinnitus, left ear: Secondary | ICD-10-CM

## 2022-10-05 NOTE — ED Notes (Signed)
Provided pt with discharge instructions and education. All of pt questions answered. Pt in possession of all belongings. Pt AAOX4 and stable at time of discharge.Pt ambulated w/ steady gait towards ED exit. Pt accompanied by son for tx home.

## 2022-10-05 NOTE — Discharge Instructions (Signed)
Your workup in the Emergency Department today was reassuring.  We did not find any specific abnormalities.  We recommend you drink plenty of fluids, take your regular medications and/or any new ones prescribed today, and follow up with the doctor(s) listed in these documents as recommended.  Return to the Emergency Department if you develop new or worsening symptoms that concern you.  

## 2022-10-05 NOTE — ED Provider Notes (Signed)
[facility] Provider Note    Event Date/Time   First MD Initiated Contact with Patient 10/05/22 0019     (approximate)   History   Chest Pain   HPI Jason Gates. is a 82 y.o. male who presents for evaluation of some discomfort in his chest including palpitations.  He has recently had a complicated history of being admitted to the hospital for vertigo and tinnitus.  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  He was nervous after being released within the last couple of days from the hospital and said that his ear started getting worse again.  This made him upset and concerned and then he started feeling palpitations and some associated chest heaviness.  He decided to come in and get checked out after he also discovered that his blood pressure was high and "I was afraid it was gone to give me a heart attack".  Once he got to the emergency department he relaxed and his blood pressure came down and he feels better although he has the persistent tinnitus in his left ear that has been present for many years but has been worse.  He said that he has a follow-up appointment scheduled with ENT (Dr. Jenne Campus).     Physical Exam   Triage Vital Signs: ED Triage Vitals  Encounter Vitals Group     BP 10/04/22 2103 (!) 195/82     Systolic BP Percentile --      Diastolic BP Percentile --      Pulse Rate 10/04/22 2103 81     Resp 10/04/22 2103 18     Temp 10/04/22 2305 98.2 F (36.8 C)     Temp Source 10/04/22 2305 Oral     SpO2 10/04/22 2103 93 %     Weight 10/04/22 2101 106.2 kg (234 lb 2.1 oz)     Height 10/04/22 2101 1.727 m (5\' 8" )     Head Circumference --      Peak Flow --      Pain Score 10/04/22 2101 0     Pain Loc --      Pain Education --      Exclude from Growth Chart --     Most recent vital signs: Vitals:   10/05/22 0200 10/05/22 0230  BP: (!) 161/71 (!) 166/72  Pulse: (!) 53 (!) 57   Resp: 14 16  Temp:  98.4 F (36.9 C)  SpO2: 96% 94%    General: Awake, generally well-appearing, no obvious distress at this time. CV:  Good peripheral perfusion.  Regular rate and rhythm, normal heart sounds. Resp:  Normal effort. Speaking easily and comfortably, no accessory muscle usage nor intercostal retractions.  Lungs clear to auscultation bilaterally. Abd:  No distention.  No tenderness to palpation of the abdomen. Other:  Patient asked me to look in his left ear as a result of his chronic tinnitus.  His ear was well-appearing, no erythema, normal tympanic membrane, no effusion.   ED Results / Procedures / Treatments   Labs (all labs ordered are listed, but only abnormal results are displayed) Labs Reviewed  BASIC METABOLIC PANEL - Abnormal; Notable for the following components:      Result Value   Glucose, Bld 132 (*)    BUN 24 (*)    Creatinine, Ser 1.75 (*)    GFR, Estimated 38 (*)    All other components  within normal limits  CBC - Abnormal; Notable for the following components:   MCV 100.2 (*)    All other components within normal limits  TROPONIN I (HIGH SENSITIVITY)  TROPONIN I (HIGH SENSITIVITY)     EKG  ED ECG REPORT I, Loleta Rose, the attending physician, personally viewed and interpreted this ECG.  Date: 10/04/2022 EKG Time: 21: 07 Rate: 83 Rhythm: sinus rhythm with marked sinus arrhythmia QRS Axis: normal Intervals: normal ST/T Wave abnormalities: Non-specific ST segment / T-wave changes, but no clear evidence of acute ischemia. Narrative Interpretation: no definitive evidence of acute ischemia; does not meet STEMI criteria.    RADIOLOGY I viewed and interpreted the patient's two-view chest x-ray.  There is no evidence of pneumonia or pneumothorax.  Insert normal report   PROCEDURES:  Critical Care performed: No  Procedures  No matter how  IMPRESSION / MDM / ASSESSMENT AND PLAN / ED COURSE  I reviewed the triage vital signs and the  nursing notes.                              Differential diagnosis includes, but is not limited to, ACS, anxiety/panic attack, musculoskeletal strain, acute metabolic or electrolyte abnormality, pneumonia, pneumothorax.  Patient's presentation is most consistent with acute presentation with potential threat to life or bodily function.  Labs/studies ordered: Two-view chest x-ray, EKG, CBC, BMP, high-sensitivity troponin x 2.  Interventions/Medications given:  Medications - No data to display  (Note:  hospital course my include additional interventions and/or labs/studies not listed above.)   Patient's labs are at baseline including his chronic kidney disease.  Reassuring high-sensitivity troponin, no anemia.  Patient was initially quite hypertensive upon arrival and I think this was mostly due to anxiety/panic attack.  Once he got here his symptoms improved (other than the chronic tinnitus).  I provided reassurance and though initially I considered hospitalization, there is no indication he is suffering from an acute or emergent condition at this time.  The patient is comfortable with the plan for discharge and I gave my usual return precautions.  He and his son will follow-up the next available opportunity.       FINAL CLINICAL IMPRESSION(S) / ED DIAGNOSES   Final diagnoses:  Tinnitus of left ear  Anxiety  Palpitations     Rx / DC Orders   ED Discharge Orders     None        Note:  This document was prepared using Dragon voice recognition software and may include unintentional dictation errors.   Loleta Rose, MD 10/05/22 [phone]

## 2022-10-08 MED ORDER — ALFUZOSIN HCL ER 10 MG PO TB24: 10.0000 mg | ORAL_TABLET | Freq: Every day | ORAL | 11 refills | Status: DC

## 2022-10-08 NOTE — Telephone Encounter (Signed)
Patient advised. Asked for new refills to be sent to the pharmacy. He will update [place] if medication is causing any issues

## 2022-10-08 NOTE — Telephone Encounter (Signed)
I am having a little trouble understanding the sequence of events.    If he never started the medication and was having his dizziness issues, then its most certainly not caused by the medication, as such I think it is okay to start.  This medication should not cause vertigo.  It may however cause dizziness when transitioning from a seated to standing position (orthostatic hypotension).  [name], MD

## 2022-11-19 ENCOUNTER — Ambulatory Visit: Payer: Medicare Other | Attending: Internal Medicine | Admitting: Internal Medicine

## 2022-11-19 ENCOUNTER — Ambulatory Visit: Payer: Medicare Other

## 2022-11-19 ENCOUNTER — Encounter: Payer: Self-pay | Admitting: Internal Medicine

## 2022-11-19 VITALS — BP 142/70 | HR 90 | Ht 68.0 in | Wt 231.0 lb

## 2022-11-19 DIAGNOSIS — I471 Supraventricular tachycardia, unspecified: Secondary | ICD-10-CM | POA: Diagnosis present

## 2022-11-19 DIAGNOSIS — I480 Paroxysmal atrial fibrillation: Secondary | ICD-10-CM | POA: Insufficient documentation

## 2022-11-19 DIAGNOSIS — I2510 Atherosclerotic heart disease of native coronary artery without angina pectoris: Secondary | ICD-10-CM | POA: Insufficient documentation

## 2022-11-19 DIAGNOSIS — E785 Hyperlipidemia, unspecified: Secondary | ICD-10-CM | POA: Diagnosis present

## 2022-11-19 DIAGNOSIS — N1832 Chronic kidney disease, stage 3b: Secondary | ICD-10-CM | POA: Diagnosis present

## 2022-11-19 NOTE — Progress Notes (Signed)
Cardiology Office Note:  .   Date:  11/19/2022  ID:  Jason Gates., DOB September 09, 1940, MRN [id number] PCP: Carren Rang, PA-C  [facility] Providers Cardiologist:  Yvonne Kendall, MD     History of Present Illness: .   Jason Gates. is a 82 y.o. male with history of coronary artery disease status post remote PCI's to the LAD and diagonal branch that were complicated by what sounds like subacute stent thrombosis, paroxysmal atrial fibrillation, PSVT, hypertension, hyperlipidemia, chronic kidney disease, obstructive sleep apnea on CPAP, stroke, bladder cancer, and GERD, who presents for follow-up of coronary artery disease and atrial fibrillation.  I last saw him in March, at which time he was feeling very well.  He reported sporadic palpitations without associated symptoms.  Chronic lower extremity edema was stable.  We did not make any medication changes or pursue additional testing.  He was hospitalized at [facility] in early August with vertigo.  Echocardiogram a week earlier did not show any significant abnormalities.  Today, Jason Gates reports that he has been feeling fairly well.  He notes some intermittent palpitations during which it seems like his "heart is beating really heavy."  This is sometimes accompanied by a brief, sharp chest pain lasting only a second.  He notes that he is typically unaware of being in atrial fibrillation.  He denies shortness of breath and lightheadedness, though he mentions the aforementioned episode of vertigo that led to hospitalization last month.  This has resolved.  Mild chronic lower extremity edema is unchanged from baseline.  He remains compliant with his medications, including apixaban.  He has not had any significant bleeding.  He intermittently checks his blood pressure at home and notes that it is typically 140-150/70-80.  ROS: See HPI  Studies Reviewed: Marland Kitchen   EKG Interpretation Date/Time:  Wednesday November 19 2022 13:27:56  EDT Ventricular Rate:  90 PR Interval:    QRS Duration:  90 QT Interval:  374 QTC Calculation: 457 R Axis:   47  Text Interpretation: Atrial fibrillation Nonspecific ST abnormality When compared with ECG of 19-Nov-2022 13:27, No significant change was found Confirmed by Cesily Cuoco, Cristal Deer [phone]) on 11/19/2022 1:49:37 PM    TTE (09/25/2022): Normal LV size and wall thickness.  LVEF 55-60% with grade 1 diastolic dysfunction.  Normal RV size and function.  No significant valvular abnormalities.  CVP 3 mmHg.  Pharmacologic MPI (10/29/2021): Low risk, probably normal pharmacologic MPI.  There is a small in size, moderate in severity, fixed apical and apical lateral defect most consistent with artifact but cannot rule out scar.  No significant ischemia identified.  LVEF greater than 65%.  Risk Assessment/Calculations:    CHA2DS2-VASc Score = 4   This indicates a 4.8% annual risk of stroke. The patient's score is based upon: CHF History: 0 HTN History: 1 Diabetes History: 0 Stroke History: 0 Vascular Disease History: 1 Age Score: 2 Gender Score: 0    HYPERTENSION CONTROL Vitals:   11/19/22 1319 11/19/22 1331  BP: (!) 148/72 (!) 142/70    The patient's blood pressure is elevated above target today.  In order to address the patient's elevated BP: Blood pressure will be monitored at home to determine if medication changes need to be made.          Physical Exam:   VS:  BP (!) 142/70 (BP Location: Left Arm, Patient Position: Supine, Cuff Size: Normal)   Pulse 90   Ht 5\' 8"  (1.727 m)   Wt  231 lb (104.8 kg)   SpO2 93%   BMI 35.12 kg/m    Wt Readings from Last 3 Encounters:  11/19/22 231 lb (104.8 kg)  10/04/22 234 lb 2.1 oz (106.2 kg)  09/30/22 234 lb 4 oz (106.3 kg)    General:  NAD. Neck: No JVD or HJR. Lungs: Clear to auscultation bilaterally without wheezes or crackles. Heart: Irregularly irregular rhythm without murmurs. Abdomen: Soft, nontender, nondistended. Extremities:  1+ pitting pretibial edema bilaterally.  ASSESSMENT AND PLAN: .    Coronary artery disease and hyperlipidemia: No frank angina reported.  Very brief chest pain is not consistent with coronary insufficiency but may be related to paroxysmal atrial fibrillation.  Myocardial perfusion stress test a year ago was reassuring without evidence of significant ischemia or scar.  We will continue current medications for secondary prevention; defer further ischemia evaluation at this time.  Paroxysmal atrial fibrillation and PSVT: Jason Gates is noted to be in atrial fibrillation today.  Given his report of more pronounced palpitations as well as sporadic transient chest discomfort, I am concerned that he may be having symptomatic atrial fibrillation.  We have agreed to obtain a 14-day event monitor (ZIO XT) for further evaluation of his atrial fibrillation burden, ventricular rates, and potential association with symptoms.  He is not on any AV nodal blocking agents at this time.  Will continue apixaban 2.5 mg twice daily based on renal function and age.  Hypertension: Blood pressure suboptimally controlled today.  Jason Gates notes some dietary changes, including increased sodium intake, associated with his current living situation.  He would like to work on lifestyle changes rather than medication titration today, given that valsartan-HCTZ was recently adjusted.  Chronic kidney disease: Continue current medications with avoidance of nephrotoxic agents.    Dispo: Return to clinic in 3 months.  Signed, Yvonne Kendall, MD

## 2022-11-19 NOTE — Patient Instructions (Signed)
Medication Instructions:  Your physician recommends that you continue on your current medications as directed. Please refer to the Current Medication list given to you today.   *If you need a refill on your cardiac medications before your next appointment, please call your pharmacy*   Lab Work: No labs ordered today    Testing/Procedures: Your physician has recommended that you wear a 14 day Zio monitor.   This monitor is a medical device that records the heart's electrical activity. Doctors most often use these monitors to diagnose arrhythmias. Arrhythmias are problems with the speed or rhythm of the heartbeat. The monitor is a small device applied to your chest. You can wear one while you do your normal daily activities. While wearing this monitor if you have any symptoms to push the button and record what you felt. Once you have worn this monitor for the period of time provider prescribed (Usually 14 days), you will return the monitor device in the postage paid box. Once it is returned they will download the data collected and provide [place] with a report which the provider will then review and we will call you with those results. Important tips:  Avoid showering during the first 24 hours of wearing the monitor. Avoid excessive sweating to help maximize wear time. Do not submerge the device, no hot tubs, and no swimming pools. Keep any lotions or oils away from the patch. After 24 hours you may shower with the patch on. Take brief showers with your back facing the shower head.  Do not remove patch once it has been placed because that will interrupt data and decrease adhesive wear time. Push the button when you have any symptoms and write down what you were feeling. Once you have completed wearing your monitor, remove and place into box which has postage paid and place in your outgoing mailbox.  If for some reason you have misplaced your box then call our office and we can provide another box  and/or mail it off for you.     Follow-Up: At [facility], you and your health needs are our priority.  As part of our continuing mission to provide you with exceptional heart care, we have created designated Provider Care Teams.  These Care Teams include your primary Cardiologist (physician) and Advanced Practice Providers (APPs -  Physician Assistants and Nurse Practitioners) who all work together to provide you with the care you need, when you need it.  We recommend signing up for the patient portal called "MyChart".  Sign up information is provided on this After Visit Summary.  MyChart is used to connect with patients for Virtual Visits (Telemedicine).  Patients are able to view lab/test results, encounter notes, upcoming appointments, etc.  Non-urgent messages can be sent to your provider as well.   To learn more about what you can do with MyChart, go to [email or web address].    Your next appointment:   3 month(s)  Provider:   You may see Yvonne Kendall, MD or one of the following Advanced Practice Providers on your designated Care Team:   Nicolasa Ducking, NP Eula Listen, PA-C Cadence Fransico Michael, PA-C Charlsie Quest, NP

## 2022-11-22 DIAGNOSIS — I471 Supraventricular tachycardia, unspecified: Secondary | ICD-10-CM | POA: Diagnosis not present

## 2022-12-17 ENCOUNTER — Other Ambulatory Visit: Payer: Self-pay

## 2022-12-17 DIAGNOSIS — I480 Paroxysmal atrial fibrillation: Secondary | ICD-10-CM

## 2022-12-24 ENCOUNTER — Ambulatory Visit: Payer: Medicare Other | Admitting: Physician Assistant

## 2023-01-13 ENCOUNTER — Ambulatory Visit: Payer: Medicare Other | Admitting: Physician Assistant

## 2023-01-21 ENCOUNTER — Ambulatory Visit: Payer: Medicare Other | Attending: Cardiology | Admitting: Cardiology

## 2023-01-21 ENCOUNTER — Encounter: Payer: Self-pay | Admitting: Cardiology

## 2023-01-21 ENCOUNTER — Emergency Department: Payer: Medicare Other

## 2023-01-21 ENCOUNTER — Ambulatory Visit: Payer: Medicare Other | Admitting: Cardiology

## 2023-01-21 ENCOUNTER — Other Ambulatory Visit: Payer: Self-pay

## 2023-01-21 ENCOUNTER — Emergency Department
Admission: EM | Admit: 2023-01-21 | Discharge: 2023-01-21 | Disposition: A | Discharge status: Home / Self Care | Payer: Medicare Other | Attending: Emergency Medicine | Admitting: Emergency Medicine

## 2023-01-21 ENCOUNTER — Telehealth: Payer: Self-pay | Admitting: Cardiology

## 2023-01-21 VITALS — BP 128/58 | HR 52 | Ht 68.0 in | Wt 235.2 lb

## 2023-01-21 DIAGNOSIS — Z8551 Personal history of malignant neoplasm of bladder: Secondary | ICD-10-CM | POA: Diagnosis not present

## 2023-01-21 DIAGNOSIS — I2510 Atherosclerotic heart disease of native coronary artery without angina pectoris: Secondary | ICD-10-CM | POA: Diagnosis not present

## 2023-01-21 DIAGNOSIS — I480 Paroxysmal atrial fibrillation: Secondary | ICD-10-CM | POA: Insufficient documentation

## 2023-01-21 DIAGNOSIS — I129 Hypertensive chronic kidney disease with stage 1 through stage 4 chronic kidney disease, or unspecified chronic kidney disease: Secondary | ICD-10-CM | POA: Insufficient documentation

## 2023-01-21 DIAGNOSIS — N189 Chronic kidney disease, unspecified: Secondary | ICD-10-CM | POA: Diagnosis not present

## 2023-01-21 DIAGNOSIS — R072 Precordial pain: Secondary | ICD-10-CM | POA: Diagnosis present

## 2023-01-21 DIAGNOSIS — R079 Chest pain, unspecified: Secondary | ICD-10-CM

## 2023-01-21 DIAGNOSIS — R001 Bradycardia, unspecified: Secondary | ICD-10-CM | POA: Diagnosis not present

## 2023-01-21 LAB — CBC
HCT: 46.5 % (ref 39.0–52.0)
Hemoglobin: 15.4 g/dL (ref 13.0–17.0)
MCH: 34.1 pg — ABNORMAL HIGH (ref 26.0–34.0)
MCHC: 33.1 g/dL (ref 30.0–36.0)
MCV: 103.1 fL — ABNORMAL HIGH (ref 80.0–100.0)
Platelets: 222 10*3/uL (ref 150–400)
RBC: 4.51 MIL/uL (ref 4.22–5.81)
RDW: 13.8 % (ref 11.5–15.5)
WBC: 10.8 10*3/uL — ABNORMAL HIGH (ref 4.0–10.5)
nRBC: 0 % (ref 0.0–0.2)

## 2023-01-21 LAB — TROPONIN I (HIGH SENSITIVITY)
Troponin I (High Sensitivity): 17 ng/L (ref <18)
Troponin I (High Sensitivity): 16 ng/L (ref <18)

## 2023-01-21 LAB — BASIC METABOLIC PANEL
Anion gap: 9 (ref 5–15)
BUN: 31 mg/dL — ABNORMAL HIGH (ref 8–23)
CO2: 27 mmol/L (ref 22–32)
Calcium: 9.3 mg/dL (ref 8.9–10.3)
Chloride: 103 mmol/L (ref 98–111)
Creatinine, Ser: 1.91 mg/dL — ABNORMAL HIGH (ref 0.61–1.24)
GFR, Estimated: 34 mL/min — ABNORMAL LOW (ref >60)
Glucose, Bld: 115 mg/dL — ABNORMAL HIGH (ref 70–99)
Potassium: 3.6 mmol/L (ref 3.5–5.1)
Sodium: 139 mmol/L (ref 135–145)

## 2023-01-21 MED ORDER — ONDANSETRON HCL 4 MG/2ML IJ SOLN: 4.0000 mg | Freq: Once | INTRAMUSCULAR | Status: DC

## 2023-01-21 MED ORDER — NITROGLYCERIN 0.4 MG SL SUBL
0.4000 mg | SUBLINGUAL_TABLET | SUBLINGUAL | Status: DC | PRN
  Administered 2023-01-21: 0.4 mg via SUBLINGUAL
  Filled 2023-01-21: qty 1

## 2023-01-21 MED ORDER — ASPIRIN 81 MG PO CHEW
81.0000 mg | CHEWABLE_TABLET | Freq: Once | ORAL | Status: AC
  Administered 2023-01-21: 81 mg via ORAL
  Filled 2023-01-21: qty 1

## 2023-01-21 MED ORDER — MORPHINE SULFATE (PF) 4 MG/ML IV SOLN: 4.0000 mg | Freq: Once | INTRAVENOUS | Status: DC

## 2023-01-21 MED ORDER — VALSARTAN 160 MG PO TABS: 160.0000 mg | ORAL_TABLET | Freq: Every day | ORAL | 3 refills | Status: DC

## 2023-01-21 NOTE — Telephone Encounter (Signed)
  Pt c/o of Chest Pain: STAT if active CP, including tightness, pressure, jaw pain, radiating pain to shoulder/upper arm/back, CP unrelieved by Nitro. Symptoms reported of SOB, nausea, vomiting, sweating.  1. Are you having CP right now? Not now    2. Are you experiencing any other symptoms (ex. SOB, nausea, vomiting, sweating)? no   3. Is your CP continuous or coming and going? Has been continuous until he took aspirin   4. Have you taken Nitroglycerin? No, but he has taken 3 baby aspirin   5. How long have you been experiencing CP? Inbtermittent pain for the last week and a half    6. If NO CP at time of call then end call with telling Pt to call back or call 911 if Chest pain returns prior to return call from triage team.

## 2023-01-21 NOTE — ED Notes (Signed)
Called CCMD to add pt to cardiac monitoring

## 2023-01-21 NOTE — Telephone Encounter (Signed)
Spoke with the patient who states that he has been having on and off chest pain for the past week or so. He reports that it woke him up this morning. Usually it will subside on its own however this morning it was persistent. He took 3 baby aspirin and laid back down. He reports that it did help for a little bit but reports that the pain is now coming back. He also reports pain down his left arm. I advised the patient that he needs to go to the ER for evaluation. Patient verbalized understanding and will have his son drive him there now.

## 2023-01-21 NOTE — Discharge Instructions (Addendum)
Follow-up with the cardiology team to discuss in the steps with your A-fib and return to the ER if you develop worsening chest pain shortness of breath or any other concerns  We noted that your heart rates were occasionally low here with some PVCs however given you do not have any symptoms you can follow-up outpatient however if you develop syncope or near syncope you should return to the ER immediately

## 2023-01-21 NOTE — Telephone Encounter (Signed)
S/w patient son, Jonny Ruiz, who asked to keep the 3:20pm appt for today with Dr. Lalla Brothers, incase they are out of the ER in time. Advised patients son that I have put the appt back on the schedule. Patients son states he will call if they can not make the appt, my direct number given so he won't have to wait to get a scheduler.

## 2023-01-21 NOTE — ED Provider Notes (Signed)
[facility] Provider Note    Event Date/Time   First MD Initiated Contact with Patient 01/21/23 1038     (approximate)   History   Chest Pain   HPI  Safeer Gillman. is a 82 y.o. male  history of coronary artery disease status post remote PCI's to the LAD and diagonal branch that were complicated by what sounds like subacute stent Gates, Jason Gates, Jason Gates, Jason Gates, hyperlipidemia, chronic kidney disease, obstructive sleep apnea on CPAP, stroke, bladder cancer, and GERD who comes in for chest pain.  I reviewed Dr. Serita Kyle note from 11/19/2022.  Patient is on Eliquis.  At that time patient was noted to have a myocardial perfusion stress test that was reassuring.  Patient reports having some off-and-on chest pain for the past week but today he had chest pain that woke him up this morning.  He reports that it usually goes away on its own but this morning it persisted.  He reports taking 3 baby aspirin's and laid back down but still had the symptoms.  He reports the pain is going down his left arm.  He reports some continued discomfort now 4 out of 10.  He denies it radiating into his back denies any numbness or tingling in his arm.  He reports being compliant with his Eliquis.  He states that he has an appointment with cardiology Dr. Lalla Brothers at 3:00 and this was to talk about his A-fib.  They think that some of his episodes of chest pain are related to his A-fib.  He states that he did not want to miss this appointment due to it taking a long time to get into it.  He states that he got a call from their office today to remind him about the appointment he mentioned the chest pain and they told him to come into the ER to be evaluated before coming to the appointment.       Physical Exam   Triage Vital Signs: ED Triage Vitals  Encounter Vitals Group     BP 01/21/23 1032 (!) 159/72     Systolic BP Percentile --      Diastolic BP  Percentile --      Pulse Rate 01/21/23 1032 72     Resp 01/21/23 1032 17     Temp 01/21/23 1032 97.9 F (36.6 C)     Temp src --      SpO2 01/21/23 1032 96 %     Weight 01/21/23 1029 230 lb (104.3 kg)     Height 01/21/23 1029 5\' 8"  (1.727 m)     Head Circumference --      Peak Flow --      Pain Score 01/21/23 1029 5     Pain Loc --      Pain Education --      Exclude from Growth Chart --     Most recent vital signs: Vitals:   01/21/23 1032  BP: (!) 159/72  Pulse: 72  Resp: 17  Temp: 97.9 F (36.6 C)  SpO2: 96%     General: Awake, no distress.  CV:  Good peripheral perfusion.  Resp:  Normal effort.  Abd:  No distention.  Soft and nontender Other:  No swelling in legs.  No calf tenderness good distal pulses sensation intact.  Equal radial pulses sensation intact   ED Results / Procedures / Treatments   Labs (all labs ordered are listed, but only abnormal results are displayed) Labs  Reviewed  BASIC METABOLIC PANEL  CBC  TROPONIN I (HIGH SENSITIVITY)     EKG  My interpretation of EKG:  Sinus rate of 70 without any ST elevation, T wave version lead III with occasional PAC, normal interval  Repeat EKG is sinus rate of 54 without any ST elevation or T wave inversions, occasional PAC, normal intervals  RADIOLOGY I have reviewed the xray personally and interpreted and no PNA   PROCEDURES:  Critical Care performed: No  .1-3 Lead EKG Interpretation  Performed by: Concha Se, MD Authorized by: Concha Se, MD     Interpretation: normal     ECG rate:  70   ECG rate assessment: normal     Rhythm: sinus rhythm     Ectopy: none     Conduction: normal      MEDICATIONS ORDERED IN ED: Medications  nitroGLYCERIN (NITROSTAT) SL tablet 0.4 mg (0.4 mg Sublingual Given 01/21/23 1116)  morphine (PF) 4 MG/ML injection 4 mg (has no administration in time range)  ondansetron (ZOFRAN) injection 4 mg (has no administration in time range)  aspirin chewable tablet  81 mg (has no administration in time range)     IMPRESSION / MDM / ASSESSMENT AND PLAN / ED COURSE  I reviewed the triage vital signs and the nursing notes.   Patient's presentation is most consistent with acute presentation with potential threat to life or bodily function.   Patient comes in with concerns for chest pain.  He reports some intermittent episodes of chest pain but today's pain was not going away and his cardiology team wanted him to be evaluated for ACS before coming over to the appointment.  Patient was given some nitro and reports resolution of symptoms.  He does report a slight headache after nitro.  Patient is already taken 3 aspirin so we will give him an additional 1.  He currently is declining the morphine that was ordered.  Troponin was 17.  BMP shows creatinine that slightly elevated at 1.9 similar to his priors may be slightly elevated.  CBC shows slightly elevated white count but he denied any infectious symptoms.  Patient had some episodes of bradycardia although he is asymptomatic.  I did discuss the case with Dr. Lalla Brothers who patient was planning to see at 320 and sent him a picture of the rhythm strip where he has some PVCs but patient is asymptomatic.  Dr. Lalla Brothers felt comfortable with patient being discharged and following up in the office given he is asymptomatic.  Patient is not on any beta-blockers or calcium channel blockers that needed to be paused.  Considered admission for bradycardia but given he is asymptomatic and has cardiology follow-up with EP patient in a few hours can be discharged to their clinic and can return if symptoms are worsening.  He does report that he was just on a Holter monitor for 2 weeks and that the appointment is to evaluate this monitor.  The patient is on the cardiac monitor to evaluate for evidence of arrhythmia and/or significant heart rate changes.      FINAL CLINICAL IMPRESSION(S) / ED DIAGNOSES   Final diagnoses:   Nonspecific chest pain  Bradycardia     Rx / DC Orders   ED Discharge Orders     None        Note:  This document was prepared using Dragon voice recognition software and may include unintentional dictation errors.   Concha Se, MD 01/21/23 [phone]

## 2023-01-21 NOTE — Patient Instructions (Addendum)
Medication Instructions:  Your physician has recommended you make the following change in your medication:  1) STOP taking Diovan (valsartan-hydrochlorothiazide) 2) START taking valsartan 160 mg daily *If you need a refill on your cardiac medications before your next appointment, please call your pharmacy*  Follow-Up: At [facility], you and your health needs are our priority.  As part of our continuing mission to provide you with exceptional heart care, we have created designated Provider Care Teams.  These Care Teams include your primary Cardiologist (physician) and Advanced Practice Providers (APPs -  Physician Assistants and Nurse Practitioners) who all work together to provide you with the care you need, when you need it.  Your next appointment:   6 months  Provider:   Sherie Don, NP    Please call your nephrologist to follow up in the next 1-2 weeks for lab work and medication review

## 2023-01-21 NOTE — Progress Notes (Signed)
Electrophysiology Office Note:    Date:  01/21/2023   ID:  Jason Spinner., DOB 1940/10/10, MRN [id number]  [facility] Cardiologist:  Yvonne Kendall, MD  [facility] Electrophysiologist:  Lanier Prude, MD   Referring MD: Yvonne Kendall, MD   Chief Complaint: Tachybradycardia syndrome  History of Present Illness:    [name] is an 82 year old man who I am seeing today for an evaluation of atrial fibrillation and tachybradycardia syndrome at the request of Dr. Okey Dupre.  The patient has a medical history that includes coronary artery disease with prior PCI, paroxysmal atrial fibrillation, hypertension, hyperlipidemia, CKD, obstructive sleep apnea on CPAP, stroke, bladder cancer, GERD.  The patient saw Dr. Okey Dupre on November 19, 2022.  At the last appointment with Dr. Okey Dupre he reported intermittent chest pain that did not sound like progressive coronary disease.  A ZIO monitor was ordered to evaluate his atrial fibrillation burden.  The patient takes Eliquis twice daily for stroke prophylaxis.  Today the patient actually was seen in the emergency department for chest discomfort.  His workup was negative including 2 negative cardiac enzymes.  The patient reports frequent episodes of chest discomfort that are long-lasting.  They do not consistently respond to nitroglycerin.  His son thinks that anxiety may be contributing.  No nausea or vomiting or shortness of breath associated with the symptoms.  Today he was evaluated in the emergency department while having ongoing chest pain and had 2 negative high-sensitivity troponins.  He denies any symptoms of atrial fibrillation.         Their past medical, social and family history was reveiwed.   ROS:   Please see the history of present illness.    All other systems reviewed and are negative.  EKGs/Labs/Other Studies Reviewed:    The following studies were reviewed today:  12/12/2022 Zio - The patient was monitored for 13  days.   The predominant rhythm was sinus with an average rate of 66 bpm in sinus rhythm (range 39-122 bpm).   Paroxysmal atrial fibrillation was observed.  Atrial fibrillation burden was 6%, with longest episode lasting 1 hour, 49 minutes.  Average ventricular rate was 102 bpm (range 55-188 bpm).   There were frequent PAC's (22.4%) and frequent PVC's (5.1%).   12 episodes of nonsustained ventricular tachycardia occurred, lasting up to 12 beats with a maximum rate of 167 bpm.   No prolonged pause was observed.   Patient triggered events correspond to sinus rhythm, PAC's and PVC's.   09/25/2022 Echo EF 55 RV normal No significant valve disease  Telemetry reviewed from emergency department visit today shows sinus rhythm with pauses lasting 2 to 3 seconds  EKG today shows sinus rhythm/sinus bradycardia with ventricular rates in the 50s.  Blocked PACs.       Physical Exam:    VS:  BP (!) 128/58 (BP Location: Left Arm, Patient Position: Sitting, Cuff Size: Large)   Pulse (!) 52   Ht 5\' 8"  (1.727 m)   Wt 235 lb 3.2 oz (106.7 kg)   SpO2 95%   BMI 35.76 kg/m     Wt Readings from Last 3 Encounters:  01/21/23 235 lb 3.2 oz (106.7 kg)  01/21/23 230 lb (104.3 kg)  11/19/22 231 lb (104.8 kg)    General: Elderly male, no distress in chair Cardiac: No murmurs rubs or gallops.  Regular rate and rhythm Respiratory: No increased work of breathing.  Lungs are clear      ASSESSMENT AND PLAN:  1. Paroxysmal atrial fibrillation (HCC)   2. Precordial pain       Assessment and Plan  #Paroxysmal atrial fibrillation Low burden.  I do not think his atrial fibrillation is the primary cause of his symptoms.  I would favor a more conservative approach in this gentleman.  We did discuss medications and catheter ablation.  I think he is a poor candidate for invasive EP procedures.  We discussed amiodarone if his atrial fibrillation were to worsen or contribute more to his clinical syndrome.  I  think that using amiodarone would be risky given his intermittent bradycardia and we discussed the possibility of amiodarone leading to permanent pacemaker implant.  For now, the patient and his son and I all agree that a more conservative approach is indicated.  #Chest pain Does not appear to be cardiac especially given today's workup in the emergency department.  #CKD 3 Follows with a nephrologist I have asked him to stop the hydrochlorothiazide and I will prescribe only the ARB component.  I have asked him to follow-up with his nephrologist in the next 1 to 2 weeks for repeat blood work and for further medication review/titration.    Signed, Rossie Muskrat. Lalla Brothers, MD, [facility], [facility] 01/21/2023 4:08 PM    Electrophysiology [facility]

## 2023-01-21 NOTE — ED Triage Notes (Signed)
Pt arrives via POV. Pt reports he woke up around 0600 this morning with chest pain that radiates down his left arm. Denies sob. Pt AxOx4. Pt reports he took three 81mg  asa this morning.

## 2023-02-02 ENCOUNTER — Ambulatory Visit: Payer: Medicare Other | Admitting: Physician Assistant

## 2023-02-02 VITALS — BP 131/80 | HR 94 | Ht 68.0 in | Wt 238.4 lb

## 2023-02-02 DIAGNOSIS — R3912 Poor urinary stream: Secondary | ICD-10-CM

## 2023-02-02 DIAGNOSIS — N401 Enlarged prostate with lower urinary tract symptoms: Secondary | ICD-10-CM | POA: Diagnosis not present

## 2023-02-02 LAB — BLADDER SCAN AMB NON-IMAGING: Scan Result: 132

## 2023-02-02 NOTE — Progress Notes (Signed)
02/02/2023 2:20 PM   Jason Argyle Jr. Jun 17, 1940 [id number]  CC: Chief Complaint  Patient presents with   Benign Prostatic Hypertrophy   Erectile Dysfunction    HPI: Jason Stuver. is a 82 y.o. male with PMH bladder cancer and BPH with weak stream who presents today for follow-up.   Today he reports he never ended up taking the alfuzosin that Dr. Apolinar Junes prescribed due to some unrelated issues with vertigo.  Overall his voiding symptoms are stable and he would prefer to defer additional pharmacotherapy due to concerns for possible side effects and polypharmacy.  He also prefers to defer procedural intervention.  Overall he is not particularly bothered by his weak stream.  Bladder scan with , last void approximately 1 hour prior.  PMH: Past Medical History:  Diagnosis Date   Atrial fibrillation (HCC)    Bladder cancer (HCC)    Chronic kidney disease (CKD)    Coronary artery disease     Surgical History: Past Surgical History:  Procedure Laterality Date   CARDIAC SURGERY     CHOLECYSTECTOMY     COLONOSCOPY WITH PROPOFOL N/A 04/21/2022   Procedure: COLONOSCOPY WITH PROPOFOL;  Surgeon: Regis Bill, MD;  Location: [facility] ENDOSCOPY;  Service: Endoscopy;  Laterality: N/A;   CORONARY ANGIOPLASTY     "two stents in 2007"   ESOPHAGOGASTRODUODENOSCOPY (EGD) WITH PROPOFOL N/A 04/21/2022   Procedure: ESOPHAGOGASTRODUODENOSCOPY (EGD) WITH PROPOFOL;  Surgeon: Regis Bill, MD;  Location: [facility] ENDOSCOPY;  Service: Endoscopy;  Laterality: N/A;   EYE SURGERY     NASAL SEPTUM SURGERY      Home Medications:  Allergies as of 02/02/2023       Reactions   K Phos Mono-sod Phos Di & Mono Diarrhea   Sulfur Dioxide Hives   Elemental Sulfur Other (See Comments)   intolerance   Phosphorus Other (See Comments)   intolerance   Potassium Phosphate    Other reaction(s): Diarrhea   Amoxicillin Rash, Hives        Medication List        Accurate as of February 02, 2023  2:20 PM. If you have any questions, ask your nurse or doctor.          STOP taking these medications    alfuzosin 10 MG 24 hr tablet Commonly known as: UROXATRAL       TAKE these medications    amLODipine 5 MG tablet Commonly known as: NORVASC Take 5 mg by mouth at bedtime.   aspirin 81 MG tablet Take 81 mg by mouth at bedtime.   BLADDER 2.2 PO Take 1 tablet by mouth daily.   carboxymethylcellulose 0.5 % Soln Commonly known as: REFRESH PLUS Place 1 drop into the right eye daily as needed (dry eyes).   doxycycline 50 MG tablet Commonly known as: ADOXA Take 50 mg by mouth daily.   Eliquis 2.5 MG Tabs tablet Generic drug: apixaban TAKE 1 TABLET TWICE A DAY   fluorometholone 0.1 % ophthalmic suspension Commonly known as: FML SMARTSIG:In Eye(s)   meclizine 25 MG tablet Commonly known as: ANTIVERT Take 1 tablet (25 mg total) by mouth 3 (three) times daily as needed for dizziness.   pantoprazole 40 MG tablet Commonly known as: PROTONIX Take 1 tablet (40 mg total) by mouth at bedtime.   sildenafil 20 MG tablet Commonly known as: REVATIO Take 1 to 5 tablets daily as needed 1 hour prior to intercourse   simvastatin 20 MG tablet Commonly known as: ZOCOR  Take 20 mg by mouth daily.   valACYclovir 500 MG tablet Commonly known as: VALTREX Take 500 mg by mouth at bedtime.   valsartan 160 MG tablet Commonly known as: Diovan Take 1 tablet (160 mg total) by mouth daily.   Vevye 0.1 % Soln Generic drug: cycloSPORINE Apply to eye.   Vitamin D 125 MCG (5000 UT) Caps Take 5,000 Units by mouth daily.        Allergies:  Allergies  Allergen Reactions   K Phos Mono-Sod Phos Di & Mono Diarrhea   Sulfur Dioxide Hives   Elemental Sulfur Other (See Comments)    intolerance   Phosphorus Other (See Comments)    intolerance   Potassium Phosphate     Other reaction(s): Diarrhea   Amoxicillin Rash and Hives    Family History: Family History  Problem  Relation Age of Onset   Obesity Mother    Diabetes Father    Heart attack Father    Heart disease Father     Social History:   reports that he quit smoking about 48 years ago. His smoking use included cigarettes. He started smoking about 63 years ago. He has a 15 pack-year smoking history. He does not have any smokeless tobacco history on file. He reports that he does not currently use alcohol. He reports that he does not use drugs.  Physical Exam: BP 131/80   Pulse 94   Ht 5\' 8"  (1.727 m)   Wt 238 lb 7 oz (108.2 kg)   BMI 36.25 kg/m   Constitutional:  Alert and oriented, no acute distress, nontoxic appearing HEENT: [place], AT Cardiovascular: No clubbing, cyanosis, or edema Respiratory: Normal respiratory effort, no increased work of breathing Skin: No rashes, bruises or suspicious lesions Neurologic: Grossly intact, no focal deficits, moving all 4 extremities Psychiatric: Normal mood and affect  Laboratory Data: Results for orders placed or performed in visit on 02/02/23  BLADDER SCAN AMB NON-IMAGING  Result Value Ref Range   Scan Result 132 ml    Assessment & Plan:   1. Benign prostatic hyperplasia with weak urinary stream Symptoms stable off pharmacotherapy.  He prefers to defer pharmacotherapy, which is reasonable since he is emptying appropriately.  Will continue to monitor. - BLADDER SCAN AMB NON-IMAGING  Return if symptoms worsen or fail to improve.  Carman Ching, PA-C  [facility] [place] [address], [address] [place], [place] [address] [phone]

## 2023-03-02 ENCOUNTER — Encounter: Payer: Self-pay | Admitting: Internal Medicine

## 2023-03-02 ENCOUNTER — Ambulatory Visit: Payer: Medicare Other | Attending: Internal Medicine | Admitting: Internal Medicine

## 2023-03-02 VITALS — BP 129/72 | HR 73 | Ht 68.0 in | Wt 221.6 lb

## 2023-03-02 DIAGNOSIS — I10 Essential (primary) hypertension: Secondary | ICD-10-CM | POA: Diagnosis present

## 2023-03-02 DIAGNOSIS — I2510 Atherosclerotic heart disease of native coronary artery without angina pectoris: Secondary | ICD-10-CM | POA: Diagnosis not present

## 2023-03-02 DIAGNOSIS — I480 Paroxysmal atrial fibrillation: Secondary | ICD-10-CM | POA: Diagnosis not present

## 2023-03-02 DIAGNOSIS — N1832 Chronic kidney disease, stage 3b: Secondary | ICD-10-CM | POA: Diagnosis present

## 2023-03-02 DIAGNOSIS — I471 Supraventricular tachycardia, unspecified: Secondary | ICD-10-CM | POA: Insufficient documentation

## 2023-03-02 NOTE — Patient Instructions (Signed)
 Medication Instructions:  Your physician recommends that you continue on your current medications as directed. Please refer to the Current Medication list given to you today.   *If you need a refill on your cardiac medications before your next appointment, please call your pharmacy*   Lab Work: No labs ordered today    Testing/Procedures: No test ordered today    Follow-Up: At [facility], you and your health needs are our priority.  As part of our continuing mission to provide you with exceptional heart care, we have created designated Provider Care Teams.  These Care Teams include your primary Cardiologist (physician) and Advanced Practice Providers (APPs -  Physician Assistants and Nurse Practitioners) who all work together to provide you with the care you need, when you need it.  We recommend signing up for the patient portal called "MyChart".  Sign up information is provided on this After Visit Summary.  MyChart is used to connect with patients for Virtual Visits (Telemedicine).  Patients are able to view lab/test results, encounter notes, upcoming appointments, etc.  Non-urgent messages can be sent to your provider as well.   To learn more about what you can do with MyChart, go to [email or web address].    Your next appointment:   6 month(s)  Provider:   You may see Yvonne Kendall, MD or one of the following Advanced Practice Providers on your designated Care Team:   Nicolasa Ducking, NP Eula Listen, PA-C Cadence Fransico Michael, PA-C Charlsie Quest, NP Carlos Levering, NP

## 2023-03-02 NOTE — Progress Notes (Signed)
 Cardiology Office Note:  .   Date:  03/02/2023  ID:  Jason Gates., DOB [birth year], MRN [id number] PCP: Franchot Houston, PA-C  [facility] Providers Cardiologist:  Lonni Hanson, MD Electrophysiologist:  OLE ONEIDA HOLTS, MD     History of Present Illness: .   Jason Gates. is a 83 y.o. male with history of coronary artery disease status post remote PCI's to the LAD and diagonal branch that were complicated by what sounds like subacute stent thrombosis, paroxysmal atrial fibrillation, PSVT, hypertension, hyperlipidemia, chronic kidney disease, obstructive sleep apnea on CPAP, stroke, bladder cancer, and GERD, who presents for follow-up of coronary artery disease and atrial fibrillation.  I last saw him in September, at which time he was feeling well other than intermittent palpitations sometimes accompanied by transient chest pain.  He was seen in the ED on 01/21/2023 for chest pain that had been going on intermittently for the last week but was more severe that morning.  EKG showed normal sinus rhythm with PACs but no acute ischemic changes.  High-sensitivity troponin I was negative x 2.  He saw Dr. Holts later that day, who felt that atrial fibrillation burden was low and not contributing to his chest pain.  No further workup was pursued.  Valsartan -HCTZ was switched to valsartan  alone in the setting of CKD.  He was encouraged to follow-up with his nephrologist.  Today, Jason Gates reports that he is feeling quite well.  He has made significant lifestyle changes over the last couple of weeks and reports ports that he has already lost 17 pounds.  He hopes to lose another 20 pounds.  He has done this primarily through diet changes though he continues to play golf and do some mild activity around the house.  He has not had any further episodes of chest pain since his ED visit in November.  He has mild exertional dyspnea going up and down stairs, which is stable from prior visits.  He  denies palpitations and lightheadedness.  Due to some recurrent leg edema after discontinuation of HCTZ at his visit with Dr. Holts in November, Jason Gates has resumed HCTZ.  Leg swelling has returned to baseline.  He notes that his nephrologist in [place] is aware of this medication change and is okay with that.  ROS: See HPI  Studies Reviewed: SABRA   EKG Interpretation Date/Time:  Monday March 02 2023 15:46:24 EST Ventricular Rate:  73 PR Interval:  144 QRS Duration:  92 QT Interval:  398 QTC Calculation: 438 R Axis:   54  Text Interpretation: Sinus rhythm with marked sinus arrhythmia with occasional Premature ventricular complexes Nonspecific ST abnormality When compared with ECG of 21-Jan-2023 11:49, Premature ventricular complexes are now Present HEART RATE has increased Confirmed by Alissandra Geoffroy, Lonni [phone]) on 03/02/2023 3:50:06 PM    Event monitor (11/19/2022): Sinus rhythm with paroxysmal atrial fibrillation (6% burden) and frequent PACs and PVCs.  Multiple runs of NSVT also noted, lasting up to 12 beats.  Risk Assessment/Calculations:    CHA2DS2-VASc Score = 4   This indicates a 4.8% annual risk of stroke. The patient's score is based upon: CHF History: 0 HTN History: 1 Diabetes History: 0 Stroke History: 0 Vascular Disease History: 1 Age Score: 2 Gender Score: 0            Physical Exam:   VS:  BP 129/72 (BP Location: Left Arm, Patient Position: Sitting, Cuff Size: Normal)   Pulse 73   Ht 5' 8 (  1.727 m)   Wt 221 lb 9.6 oz (100.5 kg)   SpO2 96%   BMI 33.69 kg/m    Wt Readings from Last 3 Encounters:  03/02/23 221 lb 9.6 oz (100.5 kg)  02/02/23 238 lb 7 oz (108.2 kg)  01/21/23 235 lb 3.2 oz (106.7 kg)    General:  NAD. Neck: No JVD or HJR. Lungs: Scattered rhonchi with fair air movement Heart: Regular rate and rhythm with occasional extrasystoles.  No murmurs or rubs. Abdomen: Soft, nontender, nondistended. Extremities: Trace pretibial edema  bilaterally.  ASSESSMENT AND PLAN: .    Coronary artery disease: No further chest pain reported since ED visit in late November.  Workup at that time was unrevealing.  Given lack of recurrent chest pain and reassuring MPI in 10/2021, we have agreed to defer medication changes and continue his current regimen for secondary prevention consisting of simvastatin  20 mg daily (LDL well-controlled on this; defer escalation given age and good response).  Continue aspirin  and apixaban  in the setting of prior PCI's and stent thrombosis, as long as bleeding does not become an issue.  Paroxysmal atrial fibrillation and PSVT: Continue apixaban  2.5 mg twice daily.  Intervention (pharmacologic and catheter-based) deferred at EP consultation with Dr. Cindie in November.  Hypertension and chronic kidney disease: Blood pressure reasonable today.  Continue amlodipine  and valsartan -HCTZ with ongoing follow-up through nephrology in the setting of CKD stage IIIb.    Dispo: Return to clinic in 6 months.  Signed, Lonni Hanson, MD

## 2023-03-03 ENCOUNTER — Encounter: Payer: Self-pay | Admitting: Internal Medicine

## 2023-04-10 IMAGING — RF DG ESOPHAGUS
11 series · 14 of 24 positions shown · non-contrast
Comparison: None Available.

CLINICAL DATA: Patient with history of dysphagia. States solid food
is getting "stuck" proximally. Request is for esophagram single.

EXAM:
ESOPHAGUS/BARIUM SWALLOW/TABLET STUDY
TECHNIQUE: Single contrast examination was performed using thin liquid barium.
This exam was performed by Ibi Maimako NP, and was supervised
and interpreted by Dr. Nicoles More.
FLUOROSCOPY:
Radiation Exposure Index (as provided by the fluoroscopic device):
21.60 mGy Kerma

[Series 1: cp_standard · 1 of 111 frames shown (1 of 8)]
[frame 17/111]
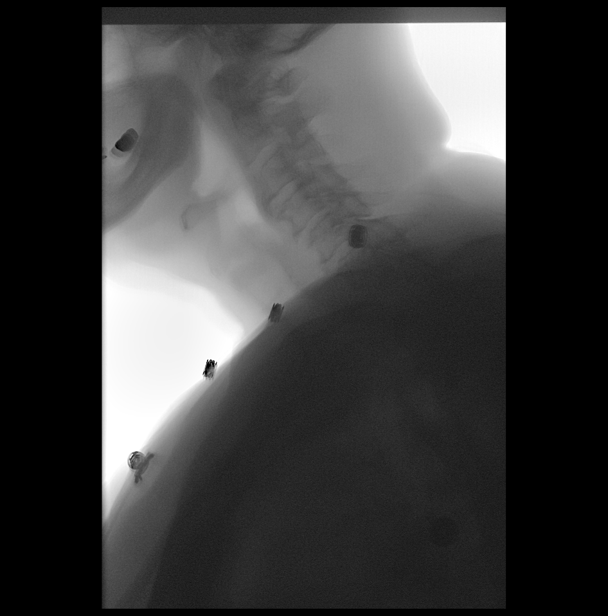

[Series 2: cp_standard · 2 of 77 frames shown (2 of 8)]
[frame 1/77]
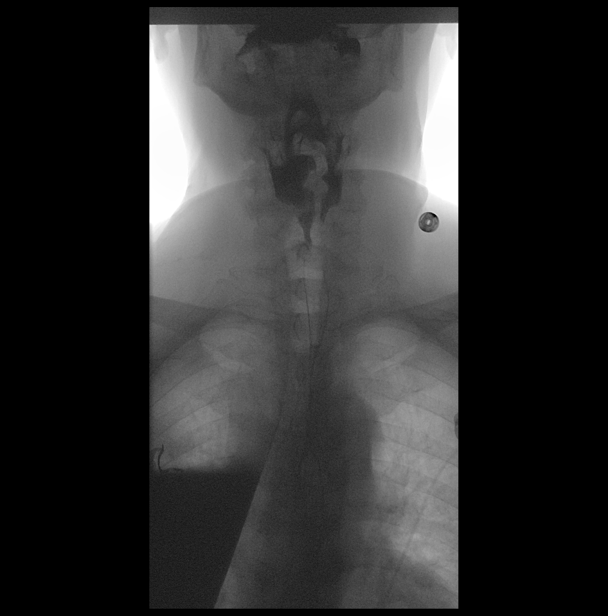
[frame 66/77]
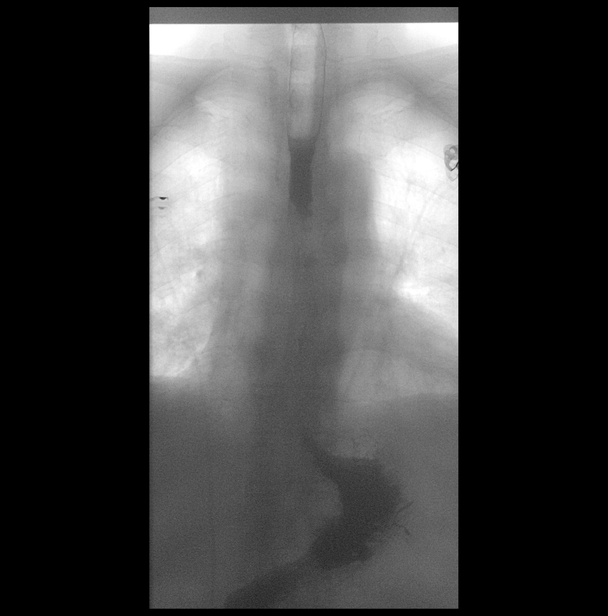

[Series 3: cp_standard · 1 of 121 frames shown (3 of 8)]
[frame 113/121]
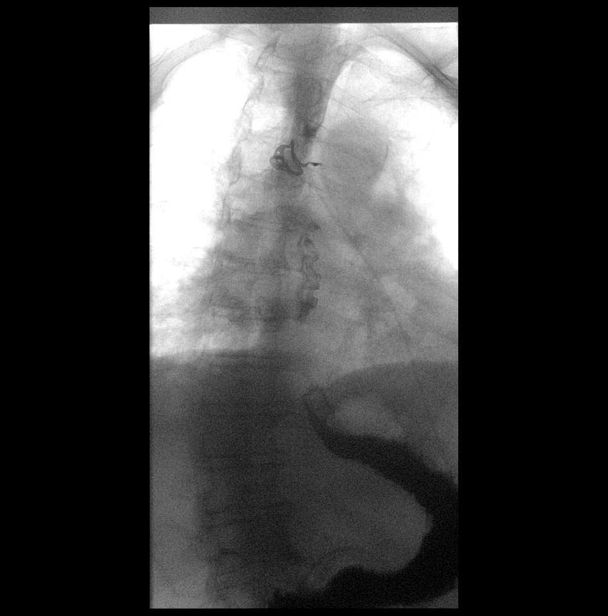

[Series 4: fluoro_barium 2fps_bw · 1 of 3 frames shown (1 of 3)]
[frame 2/3]
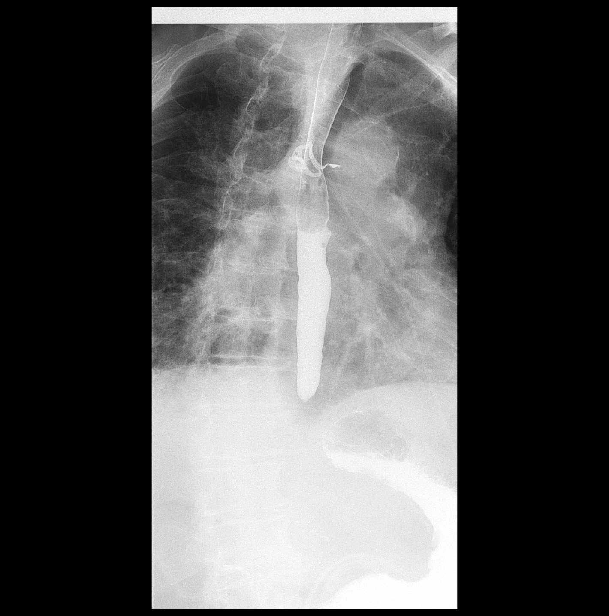

[Series 5: fluoro_barium 2fps_bw · 1 of 5 frames shown (2 of 3)]
[frame 2/5]
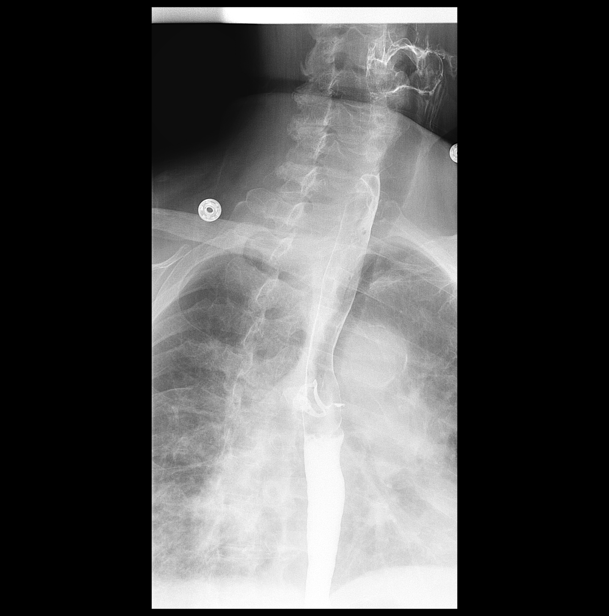

[Series 6: fluoro_barium 2fps_bw · 1 of 5 frames shown (3 of 3)]
[frame 3/5]
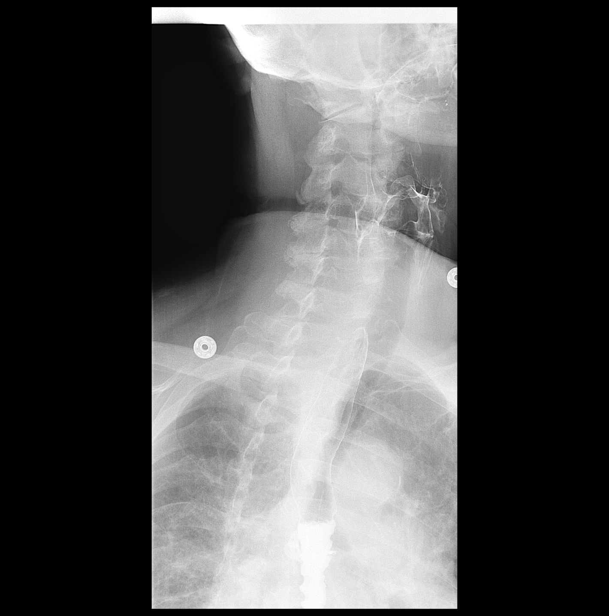

[Series 7: cp_standard · 1 of 107 frames shown (4 of 8)]
[frame 17/107]
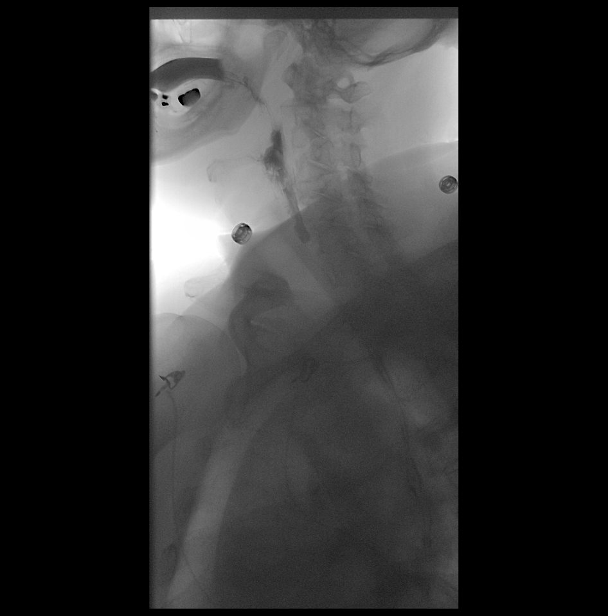

[Series 8: cp_standard · 2 of 239 frames shown (5 of 8)]
[frame 36/239]
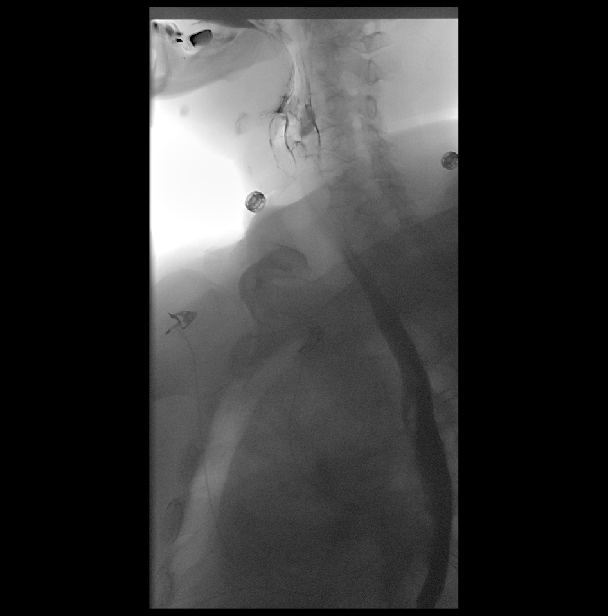
[frame 204/239]
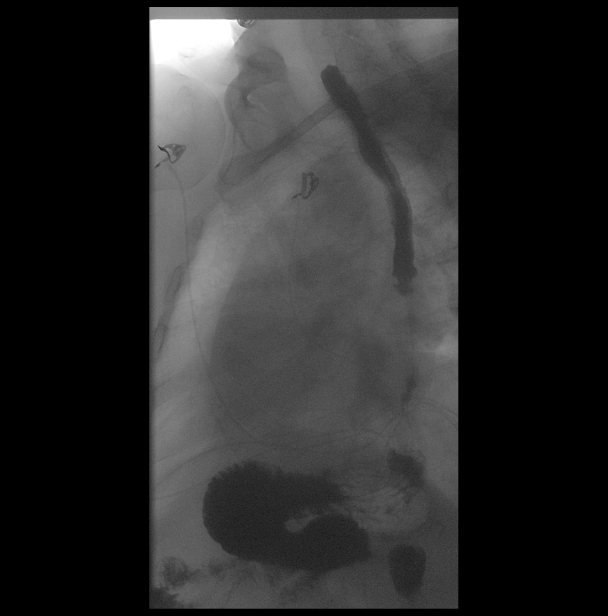

[Series 9: cp_standard · 1 of 87 frames shown (6 of 8)]
[frame 74/87]
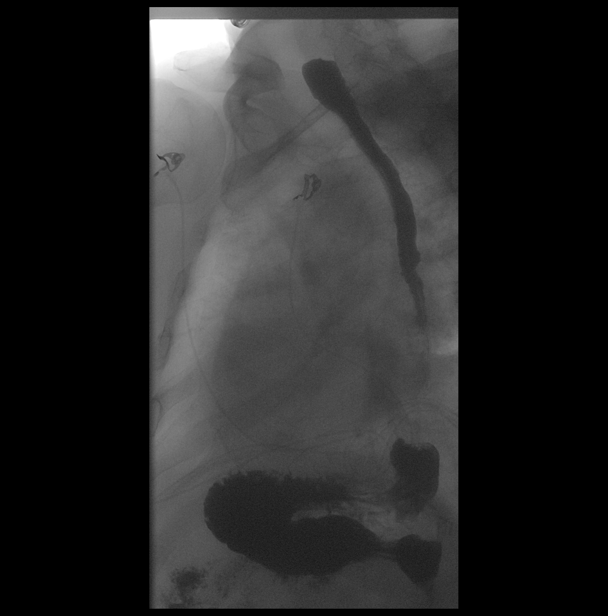

[Series 11: cp_standard · 2 of 30 frames shown (7 of 8)]
[frame 5/30]
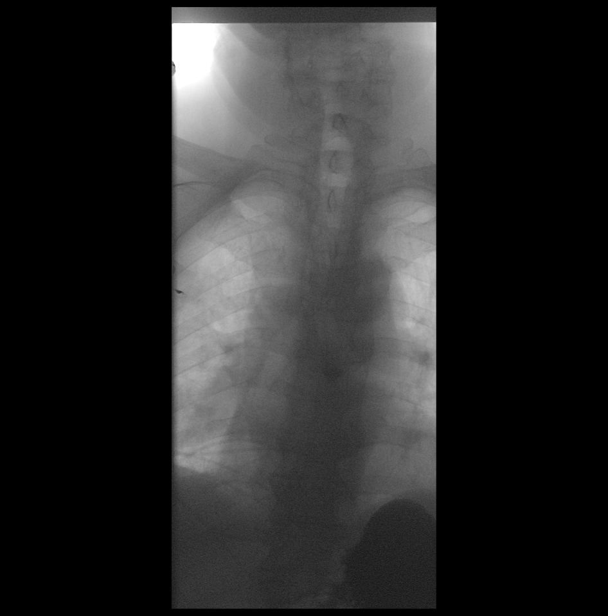
[frame 26/30]
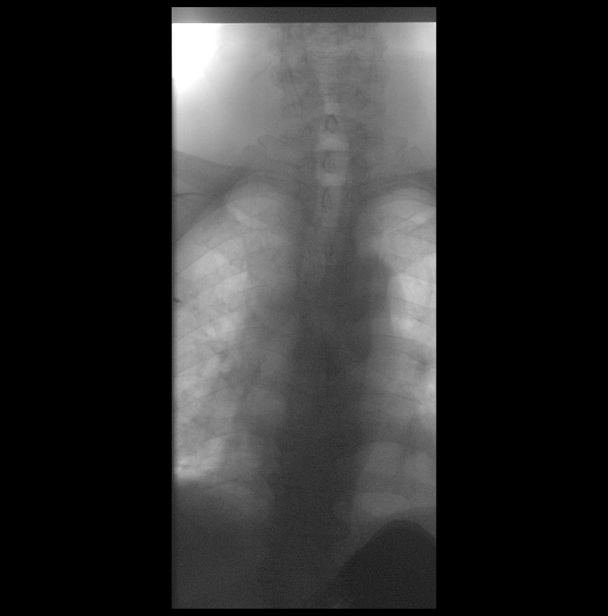

[Series 12: cp_standard · 1 of 52 frames shown (8 of 8)]
[frame 45/52]
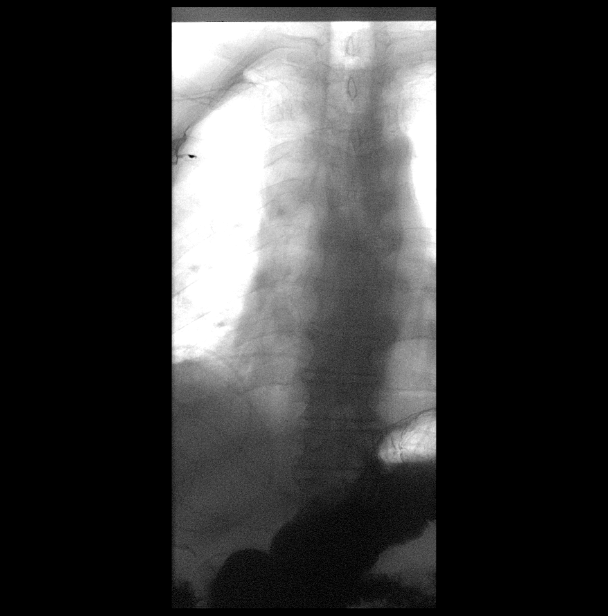

[14 of 24 positions shown; findings below may reference images not displayed]

FINDINGS: Swallowing: Appears normal. No vestibular penetration or aspiration
seen.

Pharynx: Unremarkable.

Esophagus: Normal appearance.

Esophageal motility: Mild esophageal dysmotility. Mild tertiary
contractions in the distal esophagus

Hiatal Hernia: Small hiatal hernia noted

Gastroesophageal reflux: None visualized.

Ingested 13mm barium tablet: Passed normally

Other: None.
IMPRESSION: 1.  Mild esophageal dysmotility.

2.  Small hiatal hernia

## 2023-04-11 ENCOUNTER — Emergency Department: Payer: Medicare Other

## 2023-04-11 ENCOUNTER — Inpatient Hospital Stay
Admission: EM | Admit: 2023-04-11 | Discharge: 2023-04-14 | DRG: 871 | Disposition: A | Discharge status: Home / Self Care | Payer: Medicare Other | Attending: Student | Admitting: Student

## 2023-04-11 ENCOUNTER — Other Ambulatory Visit: Payer: Self-pay

## 2023-04-11 DIAGNOSIS — Z882 Allergy status to sulfonamides status: Secondary | ICD-10-CM | POA: Diagnosis not present

## 2023-04-11 DIAGNOSIS — Z7982 Long term (current) use of aspirin: Secondary | ICD-10-CM | POA: Diagnosis not present

## 2023-04-11 DIAGNOSIS — I160 Hypertensive urgency: Secondary | ICD-10-CM | POA: Diagnosis present

## 2023-04-11 DIAGNOSIS — Z87891 Personal history of nicotine dependence: Secondary | ICD-10-CM | POA: Diagnosis not present

## 2023-04-11 DIAGNOSIS — I493 Ventricular premature depolarization: Secondary | ICD-10-CM | POA: Diagnosis present

## 2023-04-11 DIAGNOSIS — I2510 Atherosclerotic heart disease of native coronary artery without angina pectoris: Secondary | ICD-10-CM | POA: Diagnosis present

## 2023-04-11 DIAGNOSIS — Z8551 Personal history of malignant neoplasm of bladder: Secondary | ICD-10-CM | POA: Diagnosis not present

## 2023-04-11 DIAGNOSIS — Z8249 Family history of ischemic heart disease and other diseases of the circulatory system: Secondary | ICD-10-CM

## 2023-04-11 DIAGNOSIS — J189 Pneumonia, unspecified organism: Secondary | ICD-10-CM | POA: Diagnosis present

## 2023-04-11 DIAGNOSIS — I480 Paroxysmal atrial fibrillation: Secondary | ICD-10-CM | POA: Diagnosis present

## 2023-04-11 DIAGNOSIS — Z9861 Coronary angioplasty status: Secondary | ICD-10-CM | POA: Diagnosis not present

## 2023-04-11 DIAGNOSIS — A419 Sepsis, unspecified organism: Secondary | ICD-10-CM | POA: Diagnosis present

## 2023-04-11 DIAGNOSIS — Z880 Allergy status to penicillin: Secondary | ICD-10-CM | POA: Diagnosis not present

## 2023-04-11 DIAGNOSIS — Z833 Family history of diabetes mellitus: Secondary | ICD-10-CM

## 2023-04-11 DIAGNOSIS — Z7901 Long term (current) use of anticoagulants: Secondary | ICD-10-CM

## 2023-04-11 DIAGNOSIS — Z1152 Encounter for screening for COVID-19: Secondary | ICD-10-CM | POA: Diagnosis not present

## 2023-04-11 DIAGNOSIS — J9601 Acute respiratory failure with hypoxia: Secondary | ICD-10-CM | POA: Diagnosis present

## 2023-04-11 DIAGNOSIS — Z79899 Other long term (current) drug therapy: Secondary | ICD-10-CM | POA: Diagnosis not present

## 2023-04-11 DIAGNOSIS — N1832 Chronic kidney disease, stage 3b: Secondary | ICD-10-CM | POA: Diagnosis present

## 2023-04-11 DIAGNOSIS — Z77090 Contact with and (suspected) exposure to asbestos: Secondary | ICD-10-CM | POA: Diagnosis present

## 2023-04-11 DIAGNOSIS — E66811 Obesity, class 1: Secondary | ICD-10-CM | POA: Diagnosis present

## 2023-04-11 DIAGNOSIS — Z6833 Body mass index (BMI) 33.0-33.9, adult: Secondary | ICD-10-CM

## 2023-04-11 DIAGNOSIS — Z888 Allergy status to other drugs, medicaments and biological substances status: Secondary | ICD-10-CM | POA: Diagnosis not present

## 2023-04-11 DIAGNOSIS — I129 Hypertensive chronic kidney disease with stage 1 through stage 4 chronic kidney disease, or unspecified chronic kidney disease: Secondary | ICD-10-CM | POA: Diagnosis present

## 2023-04-11 DIAGNOSIS — R509 Fever, unspecified: Secondary | ICD-10-CM | POA: Diagnosis present

## 2023-04-11 LAB — COMPREHENSIVE METABOLIC PANEL
ALT: 22 U/L (ref 0–44)
AST: 28 U/L (ref 15–41)
Albumin: 4.2 g/dL (ref 3.5–5.0)
Alkaline Phosphatase: 53 U/L (ref 38–126)
Anion gap: 14 (ref 5–15)
BUN: 30 mg/dL — ABNORMAL HIGH (ref 8–23)
CO2: 26 mmol/L (ref 22–32)
Calcium: 9.2 mg/dL (ref 8.9–10.3)
Chloride: 101 mmol/L (ref 98–111)
Creatinine, Ser: 1.89 mg/dL — ABNORMAL HIGH (ref 0.61–1.24)
GFR, Estimated: 35 mL/min — ABNORMAL LOW (ref >60)
Glucose, Bld: 121 mg/dL — ABNORMAL HIGH (ref 70–99)
Potassium: 3.3 mmol/L — ABNORMAL LOW (ref 3.5–5.1)
Sodium: 141 mmol/L (ref 135–145)
Total Bilirubin: 1.3 mg/dL — ABNORMAL HIGH (ref 0.0–1.2)
Total Protein: 7.1 g/dL (ref 6.5–8.1)

## 2023-04-11 LAB — CBC WITH DIFFERENTIAL/PLATELET
Abs Immature Granulocytes: 0.09 10*3/uL — ABNORMAL HIGH (ref 0.00–0.07)
Basophils Absolute: 0 10*3/uL (ref 0.0–0.1)
Basophils Relative: 0 %
Eosinophils Absolute: 0.2 10*3/uL (ref 0.0–0.5)
Eosinophils Relative: 1 %
HCT: 43.3 % (ref 39.0–52.0)
Hemoglobin: 14.2 g/dL (ref 13.0–17.0)
Immature Granulocytes: 1 %
Lymphocytes Relative: 10 %
Lymphs Abs: 1.8 10*3/uL (ref 0.7–4.0)
MCH: 34 pg (ref 26.0–34.0)
MCHC: 32.8 g/dL (ref 30.0–36.0)
MCV: 103.6 fL — ABNORMAL HIGH (ref 80.0–100.0)
Monocytes Absolute: 0.8 10*3/uL (ref 0.1–1.0)
Monocytes Relative: 5 %
Neutro Abs: 14.9 10*3/uL — ABNORMAL HIGH (ref 1.7–7.7)
Neutrophils Relative %: 83 %
Platelets: 207 10*3/uL (ref 150–400)
RBC: 4.18 MIL/uL — ABNORMAL LOW (ref 4.22–5.81)
RDW: 14 % (ref 11.5–15.5)
WBC: 17.8 10*3/uL — ABNORMAL HIGH (ref 4.0–10.5)
nRBC: 0 % (ref 0.0–0.2)

## 2023-04-11 LAB — RESP PANEL BY RT-PCR (RSV, FLU A&B, COVID)  RVPGX2
Influenza A by PCR: NEGATIVE
Influenza B by PCR: NEGATIVE
Resp Syncytial Virus by PCR: NEGATIVE
SARS Coronavirus 2 by RT PCR: NEGATIVE

## 2023-04-11 LAB — LACTIC ACID, PLASMA: Lactic Acid, Venous: 1.2 mmol/L (ref 0.5–1.9)

## 2023-04-11 MED ORDER — LACTATED RINGERS IV BOLUS (SEPSIS)
1000.0000 mL | Freq: Once | INTRAVENOUS | Status: AC
  Administered 2023-04-11: 1000 mL via INTRAVENOUS

## 2023-04-11 MED ORDER — SODIUM CHLORIDE 0.9 % IV SOLN
2.0000 g | Freq: Once | INTRAVENOUS | Status: AC
  Administered 2023-04-11: 2 g via INTRAVENOUS
  Filled 2023-04-11: qty 20

## 2023-04-11 MED ORDER — SODIUM CHLORIDE 0.9 % IV SOLN
500.0000 mg | Freq: Once | INTRAVENOUS | Status: DC
  Filled 2023-04-11: qty 5

## 2023-04-11 NOTE — ED Provider Notes (Signed)
[facility] Provider Note    Event Date/Time   First MD Initiated Contact with Patient 04/11/23 2303     (approximate)   History   Fever and Weakness   HPI Jason Gates. is a 83 y.o. male presents for evaluation of about a week of feeling ill.  He has been having intermittent fevers and chills and generalized weakness.  He has had multiple negative flu test as an outpatient, although his PCP reportedly started him on Tamiflu just in case.  By tonight he was in significant respiratory distress and when his family checked his oxygenation, his home pulse oximeter read 85% on room air.  Upon arrival he is in no respiratory distress and feeling somewhat better on supplementary oxygen but just feels poorly overall.  He has some gurgling breath sounds but his son is at bedside and said this is normal for him.  The patient notes that he is been told multiple times in the past that he has pneumonia because of prior asbestos exposure, but the symptoms of fever, chills, generalized weakness, fatigue, hypoxia, etc., are all new for him.     Physical Exam   Triage Vital Signs: ED Triage Vitals  Encounter Vitals Group     BP 04/11/23 2222 (!) 190/66     Systolic BP Percentile --      Diastolic BP Percentile --      Pulse Rate 04/11/23 2222 93     Resp 04/11/23 2222 (!) 22     Temp 04/11/23 2222 98.4 F (36.9 C)     Temp Source 04/11/23 2222 Oral     SpO2 04/11/23 2222 (S) (!) 85 %     Weight 04/11/23 2319 100.7 kg (222 lb)     Height 04/11/23 2221 1.727 m (5\' 8" )     Head Circumference --      Peak Flow --      Pain Score 04/11/23 2221 0     Pain Loc --      Pain Education --      Exclude from Growth Chart --     Most recent vital signs: Vitals:   04/12/23 0200 04/12/23 0540  BP: (!) 154/63 130/74  Pulse: (!) 104 84  Resp: (!) 25 20  Temp:  98.2 F (36.8 C)  SpO2: 97% 98%    General: Awake, appears ill but nontoxic.  Alert and  oriented. CV:  Good peripheral perfusion.  Regular rate and rhythm.  Normal heart sounds. Resp:  Normal effort. Speaking easily and comfortably, no accessory muscle usage nor intercostal retractions.  Patient has some upper airway noise that is improved with coughing and clearing his throat.  No wheezing but some coarse breath sounds particularly in the right lower lobe. Abd:  No distention.  No tenderness to palpation of the abdomen.    ED Results / Procedures / Treatments   Labs (all labs ordered are listed, but only abnormal results are displayed) Labs Reviewed  COMPREHENSIVE METABOLIC PANEL - Abnormal; Notable for the following components:      Result Value   Potassium 3.3 (*)    Glucose, Bld 121 (*)    BUN 30 (*)    Creatinine, Ser 1.89 (*)    Total Bilirubin 1.3 (*)    GFR, Estimated 35 (*)    All other components within normal limits  CBC WITH DIFFERENTIAL/PLATELET - Abnormal; Notable for the following components:   WBC 17.8 (*)    RBC 4.18 (*)  MCV 103.6 (*)    Neutro Abs 14.9 (*)    Abs Immature Granulocytes 0.09 (*)    All other components within normal limits  URINALYSIS, W/ REFLEX TO CULTURE (INFECTION SUSPECTED) - Abnormal; Notable for the following components:   Color, Urine YELLOW (*)    APPearance CLEAR (*)    Protein, ur 30 (*)    All other components within normal limits  RESP PANEL BY RT-PCR (RSV, FLU A&B, COVID)  [id number]  CULTURE, BLOOD (ROUTINE X 2)  CULTURE, BLOOD (ROUTINE X 2)  LACTIC ACID, PLASMA  CBC  BASIC METABOLIC PANEL     EKG  ED ECG REPORT I, Loleta Rose, the attending physician, personally viewed and interpreted this ECG.  Date: 04/11/2023 EKG Time: 22: 39 Rate: 103 Rhythm: Atrial fibrillation with.  PVCs QRS Axis: normal Intervals: Abnormal due to A-fib ST/T Wave abnormalities: Non-specific ST segment / T-wave changes, but no clear evidence of acute ischemia. Narrative Interpretation: no definitive evidence of acute ischemia;  does not meet STEMI criteria.    RADIOLOGY I viewed and interpreted the patient's chest x-ray.  Consistent with pneumonia.  Radiology report agrees.   PROCEDURES:  Critical Care performed: Yes, see critical care procedure note(s)  .Critical Care  Performed by: Loleta Rose, MD Authorized by: Loleta Rose, MD   Critical care provider statement:    Critical care time (minutes):  45   Critical care time was exclusive of:  Separately billable procedures and treating other patients   Critical care was necessary to treat or prevent imminent or life-threatening deterioration of the following conditions:  Sepsis   Critical care was time spent personally by me on the following activities:  Development of treatment plan with patient or surrogate, evaluation of patient's response to treatment, examination of patient, obtaining history from patient or surrogate, ordering and performing treatments and interventions, ordering and review of laboratory studies, ordering and review of radiographic studies, pulse oximetry, re-evaluation of patient's condition and review of old charts .1-3 Lead EKG Interpretation  Performed by: Loleta Rose, MD Authorized by: Loleta Rose, MD     Interpretation: abnormal     ECG rate:  104   ECG rate assessment: tachycardic     Rhythm: atrial fibrillation     Ectopy: PVCs     Conduction: normal       IMPRESSION / MDM / ASSESSMENT AND PLAN / ED COURSE  I reviewed the triage vital signs and the nursing notes.                              Differential diagnosis includes, but is not limited to, sepsis, pneumonia, respiratory viral illness  Patient's presentation is most consistent with acute presentation with potential threat to life or bodily function.  Labs/studies ordered: I ordered standard sepsis labs/studies including the following: respiratory viral panel PCR swab, blood cultures x2, pro time-INR, CMP, urinalysis, urine culture, lactic acid, CBC with  differential, high-sensitivity troponin, lipase, 1-view chest x-ray, EKG.  Interventions/Medications given:  Medications  amLODipine (NORVASC) tablet 5 mg (has no administration in time range)  simvastatin (ZOCOR) tablet 20 mg (has no administration in time range)  pantoprazole (PROTONIX) EC tablet 40 mg (40 mg Oral Given 04/12/23 0110)  apixaban (ELIQUIS) tablet 2.5 mg (has no administration in time range)  acetaminophen (TYLENOL) tablet 650 mg (has no administration in time range)    Or  acetaminophen (TYLENOL) suppository 650 mg (has no administration in  time range)  ondansetron (ZOFRAN) tablet 4 mg (has no administration in time range)    Or  ondansetron (ZOFRAN) injection 4 mg (has no administration in time range)  azithromycin (ZITHROMAX) 500 mg in sodium chloride 0.9 % 250 mL IVPB (0 mg Intravenous Stopped 04/12/23 0128)  HYDROcodone-acetaminophen (NORCO/VICODIN) 5-325 MG per tablet 1-2 tablet (has no administration in time range)  guaiFENesin (MUCINEX) 12 hr tablet 600 mg (600 mg Oral Given 04/12/23 0110)  albuterol (PROVENTIL) (2.5 MG/3ML) 0.083% nebulizer solution 2.5 mg (has no administration in time range)  irbesartan (AVAPRO) tablet 150 mg (has no administration in time range)    And  hydrochlorothiazide (HYDRODIURIL) tablet 25 mg (has no administration in time range)  cefTRIAXone (ROCEPHIN) 2 g in sodium chloride 0.9 % 100 mL IVPB (has no administration in time range)  lactated ringers bolus 1,000 mL (0 mLs Intravenous Stopped 04/12/23 0023)  cefTRIAXone (ROCEPHIN) 2 g in sodium chloride 0.9 % 100 mL IVPB (0 g Intravenous Stopped 04/12/23 0023)    (Note:  hospital course my include additional interventions and/or labs/studies not listed above.)   Patient meets criteria for sepsis upon arrival.  I ordered 1 L of LR because his lactic acid is still pending and he does not yet meet criteria for septic shock.  I ordered 2 g ceftriaxone and azithromycin 500 mg IV as per protocol for  community-acquired pneumonia.  Patient is generally well-appearing and does not require airway protection or BiPAP.  Patient agrees with the plan for admission.  Awaiting the rest of the patient's labs.  The patient is on the cardiac monitor to evaluate for evidence of arrhythmia and/or significant heart rate changes.   Clinical Course as of 04/12/23 0600  Sat Apr 11, 2023  2318 WBC(!): 17.8 [CF]  2326 Resp panel by RT-PCR (RSV, Flu A&B, Covid) Anterior Nasal Swab Negative resp viral panel [CF]  2334 Lactic Acid, Venous: 1.2 [CF]  2352 Consulted with Dr. Para March with the hospitalist service who will admit.  Also completed sepsis reassessment.  He has no evidence of severe sepsis or septic shock and does not require 30 mL/kg of IV fluids.  After his initial fluids his heart rate has come down to the 80s and he is hemodynamically stable. [CF]    Clinical Course User Index [CF] Loleta Rose, MD     FINAL CLINICAL IMPRESSION(S) / ED DIAGNOSES   Final diagnoses:  Sepsis with acute hypoxic respiratory failure without septic shock, due to unspecified organism [facility])  Community acquired pneumonia, unspecified laterality     Rx / DC Orders   ED Discharge Orders     None        Note:  This document was prepared using Dragon voice recognition software and may include unintentional dictation errors.   Loleta Rose, MD 04/12/23 0600

## 2023-04-11 NOTE — ED Notes (Signed)
Pt's son concerned for aspiration PNA

## 2023-04-11 NOTE — Sepsis Progress Note (Signed)
 Elink following code sepsis

## 2023-04-11 NOTE — ED Triage Notes (Signed)
Pt to ed from home via POV for fever and weakness. Pt has been tested this week multiple times for flu and has been NEG. Pt has a pulse ox at home and has read 85% on RA. Pt is caox4, in no acute distress and ambulatory in triage. Pt does have some expiratory wheezing and sounds very congested.

## 2023-04-12 DIAGNOSIS — J189 Pneumonia, unspecified organism: Secondary | ICD-10-CM

## 2023-04-12 DIAGNOSIS — I160 Hypertensive urgency: Secondary | ICD-10-CM

## 2023-04-12 LAB — BASIC METABOLIC PANEL
Anion gap: 12 (ref 5–15)
BUN: 29 mg/dL — ABNORMAL HIGH (ref 8–23)
CO2: 27 mmol/L (ref 22–32)
Calcium: 8.7 mg/dL — ABNORMAL LOW (ref 8.9–10.3)
Chloride: 101 mmol/L (ref 98–111)
Creatinine, Ser: 1.79 mg/dL — ABNORMAL HIGH (ref 0.61–1.24)
GFR, Estimated: 37 mL/min — ABNORMAL LOW (ref >60)
Glucose, Bld: 132 mg/dL — ABNORMAL HIGH (ref 70–99)
Potassium: 3.5 mmol/L (ref 3.5–5.1)
Sodium: 140 mmol/L (ref 135–145)

## 2023-04-12 LAB — URINALYSIS, W/ REFLEX TO CULTURE (INFECTION SUSPECTED)
Bacteria, UA: NONE SEEN
Bilirubin Urine: NEGATIVE
Glucose, UA: NEGATIVE mg/dL
Hgb urine dipstick: NEGATIVE
Ketones, ur: NEGATIVE mg/dL
Leukocytes,Ua: NEGATIVE
Nitrite: NEGATIVE
Protein, ur: 30 mg/dL — AB
Specific Gravity, Urine: 1.016 (ref 1.005–1.030)
Squamous Epithelial / HPF: 0 /[HPF] (ref 0–5)
pH: 5 (ref 5.0–8.0)

## 2023-04-12 LAB — RESPIRATORY PANEL BY PCR

## 2023-04-12 LAB — CBC
HCT: 38.6 % — ABNORMAL LOW (ref 39.0–52.0)
Hemoglobin: 13.1 g/dL (ref 13.0–17.0)
MCH: 34.4 pg — ABNORMAL HIGH (ref 26.0–34.0)
MCHC: 33.9 g/dL (ref 30.0–36.0)
MCV: 101.3 fL — ABNORMAL HIGH (ref 80.0–100.0)
Platelets: 181 10*3/uL (ref 150–400)
RBC: 3.81 MIL/uL — ABNORMAL LOW (ref 4.22–5.81)
RDW: 14.1 % (ref 11.5–15.5)
WBC: 21.1 10*3/uL — ABNORMAL HIGH (ref 4.0–10.5)
nRBC: 0 % (ref 0.0–0.2)

## 2023-04-12 LAB — PHOSPHORUS: Phosphorus: 3.2 mg/dL (ref 2.5–4.6)

## 2023-04-12 LAB — MAGNESIUM: Magnesium: 1.7 mg/dL (ref 1.7–2.4)

## 2023-04-12 LAB — STREP PNEUMONIAE URINARY ANTIGEN: Strep Pneumo Urinary Antigen: NEGATIVE

## 2023-04-12 MED ORDER — SODIUM CHLORIDE 0.9 % IV SOLN: 2.0000 g | INTRAVENOUS | Status: DC

## 2023-04-12 MED ORDER — ACETAMINOPHEN 325 MG PO TABS
650.0000 mg | ORAL_TABLET | Freq: Four times a day (QID) | ORAL | Status: DC | PRN
  Administered 2023-04-14: 650 mg via ORAL
  Filled 2023-04-12: qty 2

## 2023-04-12 MED ORDER — HYDROCHLOROTHIAZIDE 25 MG PO TABS
25.0000 mg | ORAL_TABLET | Freq: Every day | ORAL | Status: DC
  Administered 2023-04-13 – 2023-04-14 (×2): 25 mg via ORAL
  Filled 2023-04-12 (×3): qty 1

## 2023-04-12 MED ORDER — VALSARTAN-HYDROCHLOROTHIAZIDE 160-25 MG PO TABS: 1.0000 | ORAL_TABLET | Freq: Every day | ORAL | Status: DC

## 2023-04-12 MED ORDER — AMLODIPINE BESYLATE 5 MG PO TABS
5.0000 mg | ORAL_TABLET | Freq: Every day | ORAL | Status: DC
  Administered 2023-04-12 – 2023-04-13 (×2): 5 mg via ORAL
  Filled 2023-04-12 (×2): qty 1

## 2023-04-12 MED ORDER — APIXABAN 2.5 MG PO TABS
2.5000 mg | ORAL_TABLET | Freq: Two times a day (BID) | ORAL | Status: DC
  Administered 2023-04-12 – 2023-04-14 (×5): 2.5 mg via ORAL
  Filled 2023-04-12 (×6): qty 1

## 2023-04-12 MED ORDER — ONDANSETRON HCL 4 MG/2ML IJ SOLN: 4.0000 mg | Freq: Four times a day (QID) | INTRAMUSCULAR | Status: DC | PRN

## 2023-04-12 MED ORDER — HYDROCODONE-ACETAMINOPHEN 5-325 MG PO TABS
1.0000 | ORAL_TABLET | ORAL | Status: DC | PRN
  Filled 2023-04-12: qty 1

## 2023-04-12 MED ORDER — ALBUTEROL SULFATE (2.5 MG/3ML) 0.083% IN NEBU: 2.5000 mg | INHALATION_SOLUTION | RESPIRATORY_TRACT | Status: DC | PRN

## 2023-04-12 MED ORDER — SODIUM CHLORIDE 0.9 % IV SOLN
500.0000 mg | INTRAVENOUS | Status: DC
  Administered 2023-04-12 – 2023-04-13 (×2): 500 mg via INTRAVENOUS
  Filled 2023-04-12 (×2): qty 5

## 2023-04-12 MED ORDER — PANTOPRAZOLE SODIUM 40 MG PO TBEC
40.0000 mg | DELAYED_RELEASE_TABLET | Freq: Every day | ORAL | Status: DC
  Administered 2023-04-12 – 2023-04-13 (×3): 40 mg via ORAL
  Filled 2023-04-12 (×3): qty 1

## 2023-04-12 MED ORDER — IRBESARTAN 150 MG PO TABS
150.0000 mg | ORAL_TABLET | Freq: Every day | ORAL | Status: DC
  Administered 2023-04-12 – 2023-04-14 (×3): 150 mg via ORAL
  Filled 2023-04-12 (×3): qty 1

## 2023-04-12 MED ORDER — ONDANSETRON HCL 4 MG PO TABS: 4.0000 mg | ORAL_TABLET | Freq: Four times a day (QID) | ORAL | Status: DC | PRN

## 2023-04-12 MED ORDER — GUAIFENESIN ER 600 MG PO TB12
600.0000 mg | ORAL_TABLET | Freq: Two times a day (BID) | ORAL | Status: DC
  Administered 2023-04-12 – 2023-04-14 (×6): 600 mg via ORAL
  Filled 2023-04-12 (×7): qty 1

## 2023-04-12 MED ORDER — ACETAMINOPHEN 650 MG RE SUPP
650.0000 mg | Freq: Four times a day (QID) | RECTAL | Status: DC | PRN
Start: 2023-04-12 — End: 2023-04-14

## 2023-04-12 MED ORDER — SIMVASTATIN 20 MG PO TABS
20.0000 mg | ORAL_TABLET | Freq: Every day | ORAL | Status: DC
  Administered 2023-04-12 – 2023-04-14 (×3): 20 mg via ORAL
  Filled 2023-04-12: qty 2
  Filled 2023-04-12 (×2): qty 1

## 2023-04-12 MED ORDER — SODIUM CHLORIDE 0.9 % IV SOLN
2.0000 g | INTRAVENOUS | Status: DC
  Administered 2023-04-12 – 2023-04-13 (×2): 2 g via INTRAVENOUS
  Filled 2023-04-12 (×2): qty 20

## 2023-04-12 NOTE — Assessment & Plan Note (Signed)
 -  Renal function  at baseline

## 2023-04-12 NOTE — Progress Notes (Signed)
[facility] Progress Note  Patient: Jason Gates.    VHQ:[id number]  DOA: 04/11/2023     Date of Service: the patient was seen and examined on 04/12/2023  Chief Complaint  Patient presents with   Fever   Weakness   Brief hospital course: Jason Gates. is a 83 y.o. male with medical history significant for A-fib on Eliquis, CKD 3B, HTN, CAD with history of PCI to LAD, as well as history of asbestos exposure with chronic congested breathing, brought to the ED with fever, weakness and O2 sats 85% on room air on home pulse ox.  Patient has a congested cough and wheezing and outpatient flu test have been negative. ED course and data review: BP 190/66, respirations 22 with O2 sat 85% on room air.  Afebrile, pulse 93 Workup significant for WBC 17,000 with lactic acid 1.2.  Negative COVID, flu and RSV Creatinine 1.9 which is about baseline.  Potassium 3.3 EKG, personally viewed and interpreted showing A-fib at 103 with PVCs Chest x-ray shows patchy bilateral lower lobe airspace disease typical of pneumonia. Patient started on Rocephin and azithromycin and was given an LR bolus. Hospitalist consulted for admission.    Assessment and Plan:  # CAP (community acquired pneumonia) Acute respiratory failure with hypoxia Continue Rocephin and azithromycin Antitussives, albuterol as needed Incentive spirometer Supplemental oxygen to keep sats over 94% RVP panel pending   # Hypertensive urgency Continue home amlodipine and valsartan Hydralazine IV as needed for additional BP control   Stage 3b chronic kidney disease (CKD)  Renal function at baseline.   Paroxysmal atrial fibrillation (HCC) Does not appear to be on rate control agents Continue apixaban for stroke prevention   Coronary artery disease involving native coronary artery of native heart without angina pectoris No complaints of chest pain, EKG nonacute Continue simvastatin, apixaban    Body mass index is 33.75  kg/m.  Interventions:  Diet: Heart healthy diet DVT Prophylaxis: Eliquis  Advance goals of care discussion: Full code  Family Communication: family was present at bedside, at the time of interview.  The pt provided permission to discuss medical plan with the family. Opportunity was given to ask question and all questions were answered satisfactorily.   Disposition:  Pt is from Home, admitted with CAP, still on O2 and IV Abx, which precludes a safe discharge. Discharge to Home, when stable,l may need few days to improve.  Subjective: No significant overnight events, patient's breathing is improving, still has significant productive cough.  Patient was feeling tired as he did not get enough sleep in the ED last night.  No any other complaints.  Physical Exam: General: NAD, lying comfortably Appear in no distress, affect appropriate Eyes: PERRLA ENT: Oral Mucosa Clear, moist  Neck: no JVD,  Cardiovascular: S1 and S2 Present, no Murmur,  Respiratory: Equal air entry bilaterally, good respiratory effort, Bilateral Air entry equal and Decreased, no Crackles, no wheezes Abdomen: Bowel Sound present, Soft and no tenderness,  Skin: no rashes Extremities: no Pedal edema, no calf tenderness Neurologic: without any new focal findings Gait not checked due to patient safety concerns  Vitals:   04/12/23 1145 04/12/23 1200 04/12/23 1215 04/12/23 1254  BP:  120/72    Pulse: 63 82    Resp:      Temp:    97.9 F (36.6 C)  TempSrc:    Oral  SpO2:   90%   Weight:      Height:  Intake/Output Summary (Last 24 hours) at 04/12/2023 1404 Last data filed at 04/12/2023 0128 Gross per 24 hour  Intake 250 ml  Output --  Net 250 ml   Filed Weights   04/11/23 2319  Weight: 100.7 kg    Data Reviewed: I have personally reviewed and interpreted daily labs, tele strips, imagings as discussed above. I reviewed all nursing notes, pharmacy notes, vitals, pertinent old records I have  discussed plan of care as described above with RN and patient/family.  CBC: Recent Labs  Lab 04/11/23 2245 04/12/23 0500  WBC 17.8* 21.1*  NEUTROABS 14.9*  --   HGB 14.2 13.1  HCT 43.3 38.6*  MCV 103.6* 101.3*  PLT 207 181   Basic Metabolic Panel: Recent Labs  Lab 04/11/23 2245 04/12/23 0500  NA 141 140  K 3.3* 3.5  CL 101 101  CO2 26 27  GLUCOSE 121* 132*  BUN 30* 29*  CREATININE 1.89* 1.79*  CALCIUM 9.2 8.7*  MG  --  1.7  PHOS  --  3.2    Studies: DG Chest 1 View Result Date: 04/11/2023 CLINICAL DATA:  Cough.  Fever and weakness. EXAM: CHEST  1 VIEW COMPARISON:  01/21/2023 FINDINGS: Patchy bilateral lower lobe airspace disease suspicious for pneumonia. Stable heart size and mediastinal contours. No pleural fluid or pneumothorax. No pulmonary edema. No acute osseous findings. IMPRESSION: Patchy bilateral lower lobe airspace disease typical of pneumonia. Electronically Signed   By: Narda Rutherford M.D.   On: 04/11/2023 22:51    Scheduled Meds:  amLODipine  5 mg Oral QHS   apixaban  2.5 mg Oral BID   guaiFENesin  600 mg Oral BID   irbesartan  150 mg Oral Daily   And   hydrochlorothiazide  25 mg Oral Daily   pantoprazole  40 mg Oral QHS   simvastatin  20 mg Oral Daily   Continuous Infusions:  azithromycin Stopped (04/12/23 0128)   cefTRIAXone (ROCEPHIN)  IV     PRN Meds: acetaminophen **OR** acetaminophen, albuterol, HYDROcodone-acetaminophen, ondansetron **OR** ondansetron (ZOFRAN) IV  Time spent: 35 minutes  Author: Gillis Santa. MD [facility] 04/12/2023 2:04 PM  To reach On-call, see care teams to locate the attending and reach out to them via [email or web address]. If 7PM-7AM, please contact night-coverage If you still have difficulty reaching the attending provider, please page the [facility] (Director on Call) for [facility] on amion for assistance.

## 2023-04-12 NOTE — Assessment & Plan Note (Addendum)
BP 190/66 Continue home amlodipine and valsartan Hydralazine IV as needed for additional BP control

## 2023-04-12 NOTE — Assessment & Plan Note (Signed)
Does not appear to be on rate control agents Continue apixaban for stroke prevention

## 2023-04-12 NOTE — Assessment & Plan Note (Signed)
Acute respiratory failure with hypoxia Rocephin and azithromycin Antitussives, albuterol as needed Incentive spirometer Supplemental oxygen to keep sats over 94%

## 2023-04-12 NOTE — H&P (Signed)
History and Physical    Patient: Jason Gates. ZOX:[id number] DOB: [birth year] DOA: 04/11/2023 DOS: the patient was seen and examined on 04/12/2023 PCP: Carren Rang, PA-C  Patient coming from: Home  Chief Complaint:  Chief Complaint  Patient presents with   Fever   Weakness    HPI: Jason Gates. is a 83 y.o. male with medical history significant for A-fib on Eliquis, CKD 3B, HTN, CAD with history of PCI to LAD, brought to the ED with fever, weakness and O2 sats 85% on room air on home pulse ox.  Patient has a congested cough and wheezing and outpatient flu test have been negative. ED course and data review: BP 190/66, respirations 22 with O2 sat 85% on room air.  Afebrile, pulse 93 Workup significant for WBC 17,000 with lactic acid 1.2.  Respiratory viral panel negative Creatinine 1.9 which is about baseline.  Potassium 3.3 EKG, personally viewed and interpreted showing A-fib at 103 with PVCs Chest x-ray shows patchy bilateral lower lobe airspace disease typical of pneumonia. Patient started on Rocephin and azithromycin and was given an LR bolus. Hospitalist consulted for admission.     Past Medical History:  Diagnosis Date   Atrial fibrillation (HCC)    Bladder cancer (HCC)    Chronic kidney disease (CKD)    Coronary artery disease    Past Surgical History:  Procedure Laterality Date   CARDIAC SURGERY     CHOLECYSTECTOMY     COLONOSCOPY WITH PROPOFOL N/A 04/21/2022   Procedure: COLONOSCOPY WITH PROPOFOL;  Surgeon: Regis Bill, MD;  Location: [facility] ENDOSCOPY;  Service: Endoscopy;  Laterality: N/A;   CORONARY ANGIOPLASTY     "two stents in 2007"   ESOPHAGOGASTRODUODENOSCOPY (EGD) WITH PROPOFOL N/A 04/21/2022   Procedure: ESOPHAGOGASTRODUODENOSCOPY (EGD) WITH PROPOFOL;  Surgeon: Regis Bill, MD;  Location: [facility] ENDOSCOPY;  Service: Endoscopy;  Laterality: N/A;   EYE SURGERY     NASAL SEPTUM SURGERY     Social History:  reports that he quit  smoking about 49 years ago. His smoking use included cigarettes. He started smoking about 64 years ago. He has a 15 pack-year smoking history. He does not have any smokeless tobacco history on file. He reports that he does not currently use alcohol. He reports that he does not use drugs.  Allergies  Allergen Reactions   K Phos Mono-Sod Phos Di & Mono Diarrhea   Sulfur Dioxide Hives   Elemental Sulfur Other (See Comments)    intolerance   Phosphorus Other (See Comments)    intolerance   Potassium Phosphate     Other reaction(s): Diarrhea   Amoxicillin Rash and Hives    Family History  Problem Relation Age of Onset   Obesity Mother    Diabetes Father    Heart attack Father    Heart disease Father     Prior to Admission medications   Medication Sig Start Date End Date Taking? Authorizing Provider  amLODipine (NORVASC) 5 MG tablet Take 5 mg by mouth at bedtime. 04/26/15   [provider]  aspirin 81 MG tablet Take 81 mg by mouth at bedtime.    [provider]  augmented betamethasone dipropionate (DIPROLENE-AF) 0.05 % cream Apply topically 2 (two) times daily as needed. 02/09/23   [provider]  Cholecalciferol (VITAMIN D) 125 MCG (5000 UT) CAPS Take 5,000 Units by mouth daily.    [provider]  ELIQUIS 2.5 MG TABS tablet TAKE 1 TABLET TWICE A DAY 08/04/22  End, Cristal Deer, MD  Nutritional Supplements (BLADDER 2.2 PO) Take 1 tablet by mouth daily.    [provider]  pantoprazole (PROTONIX) 40 MG tablet Take 1 tablet (40 mg total) by mouth at bedtime. 01/23/22 03/02/23  End, Cristal Deer, MD  sildenafil (REVATIO) 20 MG tablet Take 1 to 5 tablets daily as needed 1 hour prior to intercourse 09/24/22   [name], MD  simvastatin (ZOCOR) 20 MG tablet Take 20 mg by mouth daily.    [provider]  valACYclovir (VALTREX) 500 MG tablet Take 500 mg by mouth at bedtime. 06/12/15   [provider]  valsartan-hydrochlorothiazide  (DIOVAN-HCT) 160-25 MG tablet Take 1 tablet by mouth daily.    [provider]  VEVYE 0.1 % SOLN Apply to eye. 01/01/23   [provider]    Physical Exam: Vitals:   04/11/23 2221 04/11/23 2222 04/11/23 2319  BP:  (!) 190/66   Pulse:  93   Resp:  (!) 22   Temp:  98.4 F (36.9 C)   TempSrc:  Oral   SpO2:  (S) (!) 85%   Weight:   100.7 kg  Height: 5\' 8"  (1.727 m)  5\' 8"  (1.727 m)   Physical Exam  Labs on Admission: I have personally reviewed following labs and imaging studies  CBC: Recent Labs  Lab 04/11/23 2245  WBC 17.8*  NEUTROABS 14.9*  HGB 14.2  HCT 43.3  MCV 103.6*  PLT 207   Basic Metabolic Panel: Recent Labs  Lab 04/11/23 2245  NA 141  K 3.3*  CL 101  CO2 26  GLUCOSE 121*  BUN 30*  CREATININE 1.89*  CALCIUM 9.2   GFR: Estimated Creatinine Clearance: 33.5 mL/min (A) (by C-G formula based on SCr of 1.89 mg/dL (H)). Liver Function Tests: Recent Labs  Lab 04/11/23 2245  AST 28  ALT 22  ALKPHOS 53  BILITOT 1.3*  PROT 7.1  ALBUMIN 4.2   No results for input(s): "LIPASE", "AMYLASE" in the last 168 hours. No results for input(s): "AMMONIA" in the last 168 hours. Coagulation Profile: No results for input(s): "INR", "PROTIME" in the last 168 hours. Cardiac Enzymes: No results for input(s): "CKTOTAL", "CKMB", "CKMBINDEX", "TROPONINI" in the last 168 hours. BNP (last 3 results) No results for input(s): "PROBNP" in the last 8760 hours. HbA1C: No results for input(s): "HGBA1C" in the last 72 hours. CBG: No results for input(s): "GLUCAP" in the last 168 hours. Lipid Profile: No results for input(s): "CHOL", "HDL", "LDLCALC", "TRIG", "CHOLHDL", "LDLDIRECT" in the last 72 hours. Thyroid Function Tests: No results for input(s): "TSH", "T4TOTAL", "FREET4", "T3FREE", "THYROIDAB" in the last 72 hours. Anemia Panel: No results for input(s): "VITAMINB12", "FOLATE", "FERRITIN", "TIBC", "IRON", "RETICCTPCT" in the last 72 hours. Urine  analysis:    Component Value Date/Time   COLORURINE COLORLESS (A) 07/09/2021 2224   APPEARANCEUR Clear 09/24/2022 1331   LABSPEC 1.009 07/09/2021 2224   PHURINE 5.5 07/09/2021 2224   GLUCOSEU Negative 09/24/2022 1331   HGBUR NEGATIVE 07/09/2021 2224   BILIRUBINUR Negative 09/24/2022 1331   KETONESUR NEGATIVE 07/09/2021 2224   PROTEINUR Negative 09/24/2022 1331   PROTEINUR NEGATIVE 07/09/2021 2224   NITRITE Negative 09/24/2022 1331   NITRITE NEGATIVE 07/09/2021 2224   LEUKOCYTESUR Negative 09/24/2022 1331   LEUKOCYTESUR NEGATIVE 07/09/2021 2224    Radiological Exams on Admission: DG Chest 1 View Result Date: 04/11/2023 CLINICAL DATA:  Cough.  Fever and weakness. EXAM: CHEST  1 VIEW COMPARISON:  01/21/2023 FINDINGS: Patchy bilateral lower lobe airspace disease suspicious  for pneumonia. Stable heart size and mediastinal contours. No pleural fluid or pneumothorax. No pulmonary edema. No acute osseous findings. IMPRESSION: Patchy bilateral lower lobe airspace disease typical of pneumonia. Electronically Signed   By: Narda Rutherford M.D.   On: 04/11/2023 22:51     Data Reviewed: Relevant notes from primary care and specialist visits, past discharge summaries as available in EHR, including Care Everywhere. Prior diagnostic testing as pertinent to current admission diagnoses Updated medications and problem lists for reconciliation ED course, including vitals, labs, imaging, treatment and response to treatment Triage notes, nursing and pharmacy notes and ED provider's notes Notable results as noted in HPI   Assessment and Plan: * CAP (community acquired pneumonia) Acute respiratory failure with hypoxia Rocephin and azithromycin Antitussives, albuterol as needed Incentive spirometer Supplemental oxygen to keep sats over 94%  Hypertensive urgency BP 190/66 Continue home amlodipine and valsartan Hydralazine IV as needed for additional BP control  Stage 3b chronic kidney disease  (CKD) (HCC) Renal function at baseline.  Paroxysmal atrial fibrillation (HCC) Does not appear to be on rate control agents Continue apixaban for stroke prevention  Coronary artery disease involving native coronary artery of native heart without angina pectoris No complaints of chest pain, EKG nonacute Continue simvastatin, apixaban     DVT prophylaxis: eliquis  Consults: none  Advance Care Planning:   Code Status: Prior   Family Communication: none  Disposition Plan: Back to previous home environment  Severity of Illness: The appropriate patient status for this patient is INPATIENT. Inpatient status is judged to be reasonable and necessary in order to provide the required intensity of service to ensure the patient's safety. The patient's presenting symptoms, physical exam findings, and initial radiographic and laboratory data in the context of their chronic comorbidities is felt to place them at high risk for further clinical deterioration. Furthermore, it is not anticipated that the patient will be medically stable for discharge from the hospital within 2 midnights of admission.   * I certify that at the point of admission it is my clinical judgment that the patient will require inpatient hospital care spanning beyond 2 midnights from the point of admission due to high intensity of service, high risk for further deterioration and high frequency of surveillance required.*  Author: Andris Baumann, MD 04/12/2023 12:03 AM  For on call review [email or web address].

## 2023-04-12 NOTE — ED Notes (Signed)
 Advised nurse that patient has ready bed

## 2023-04-12 NOTE — Hospital Course (Signed)
 Marland Kitchen

## 2023-04-12 NOTE — Consult Note (Signed)
CODE SEPSIS - PHARMACY COMMUNICATION  **Broad Spectrum Antibiotics should be administered within 1 hour of Sepsis diagnosis**  Time Code Sepsis Called/Page Received: 2307  Antibiotics Ordered: azithromycin, ceftriaxone  Time of 1st antibiotic administration: 2314  Additional action taken by pharmacy: na  If necessary, Name of Provider/Nurse Contacted: na    Bettey Costa ,PharmD Clinical Pharmacist  04/12/2023  12:13 AM

## 2023-04-12 NOTE — Assessment & Plan Note (Signed)
No complaints of chest pain, EKG nonacute Continue simvastatin, apixaban

## 2023-04-13 DIAGNOSIS — J189 Pneumonia, unspecified organism: Secondary | ICD-10-CM | POA: Diagnosis not present

## 2023-04-13 LAB — BASIC METABOLIC PANEL
Anion gap: 10 (ref 5–15)
BUN: 24 mg/dL — ABNORMAL HIGH (ref 8–23)
CO2: 27 mmol/L (ref 22–32)
Calcium: 8.7 mg/dL — ABNORMAL LOW (ref 8.9–10.3)
Chloride: 103 mmol/L (ref 98–111)
Creatinine, Ser: 1.65 mg/dL — ABNORMAL HIGH (ref 0.61–1.24)
GFR, Estimated: 41 mL/min — ABNORMAL LOW (ref >60)
Glucose, Bld: 120 mg/dL — ABNORMAL HIGH (ref 70–99)
Potassium: 3.5 mmol/L (ref 3.5–5.1)
Sodium: 140 mmol/L (ref 135–145)

## 2023-04-13 LAB — VITAMIN D 25 HYDROXY (VIT D DEFICIENCY, FRACTURES): Vit D, 25-Hydroxy: 83.41 ng/mL (ref 30–100)

## 2023-04-13 LAB — PHOSPHORUS: Phosphorus: 2.8 mg/dL (ref 2.5–4.6)

## 2023-04-13 LAB — CBC
HCT: 40.1 % (ref 39.0–52.0)
Hemoglobin: 13.4 g/dL (ref 13.0–17.0)
MCH: 34 pg (ref 26.0–34.0)
MCHC: 33.4 g/dL (ref 30.0–36.0)
MCV: 101.8 fL — ABNORMAL HIGH (ref 80.0–100.0)
Platelets: 199 10*3/uL (ref 150–400)
RBC: 3.94 MIL/uL — ABNORMAL LOW (ref 4.22–5.81)
RDW: 13.8 % (ref 11.5–15.5)
WBC: 12.9 10*3/uL — ABNORMAL HIGH (ref 4.0–10.5)
nRBC: 0 % (ref 0.0–0.2)

## 2023-04-13 LAB — MAGNESIUM: Magnesium: 2 mg/dL (ref 1.7–2.4)

## 2023-04-13 LAB — VITAMIN B12: Vitamin B-12: 399 pg/mL (ref 180–914)

## 2023-04-13 MED ORDER — AZITHROMYCIN 500 MG PO TABS
500.0000 mg | ORAL_TABLET | Freq: Every day | ORAL | Status: DC
  Administered 2023-04-13: 500 mg via ORAL
  Filled 2023-04-13: qty 1

## 2023-04-13 MED ORDER — SODIUM CHLORIDE 0.9 % IV SOLN
500.0000 mg | INTRAVENOUS | Status: DC
  Filled 2023-04-13: qty 5

## 2023-04-13 NOTE — Progress Notes (Signed)
[facility] Progress Note  Patient: Jason Gates.    ZOX:[id number]  DOA: 04/11/2023     Date of Service: the patient was seen and examined on 04/13/2023  Chief Complaint  Patient presents with   Fever   Weakness   Brief hospital course: Lynwood Kubisiak. is a 83 y.o. male with medical history significant for A-fib on Eliquis, CKD 3B, HTN, CAD with history of PCI to LAD, as well as history of asbestos exposure with chronic congested breathing, brought to the ED with fever, weakness and O2 sats 85% on room air on home pulse ox.  Patient has a congested cough and wheezing and outpatient flu test have been negative. ED course and data review: BP 190/66, respirations 22 with O2 sat 85% on room air.  Afebrile, pulse 93 Workup significant for WBC 17,000 with lactic acid 1.2.  Negative COVID, flu and RSV Creatinine 1.9 which is about baseline.  Potassium 3.3 EKG, personally viewed and interpreted showing A-fib at 103 with PVCs Chest x-ray shows patchy bilateral lower lobe airspace disease typical of pneumonia. Patient started on Rocephin and azithromycin and was given an LR bolus. Hospitalist consulted for admission.    Assessment and Plan:  # CAP (community acquired pneumonia) Acute respiratory failure with hypoxia Continue Rocephin and azithromycin Antitussives, albuterol as needed Incentive spirometer Supplemental oxygen to keep sats over 94% RVP panel negative 2/17 still on supplemental O2 relation 1.5 L via [place]  # Hypertensive urgency Continue home amlodipine and valsartan Hydralazine IV as needed for additional BP control   Stage 3b chronic kidney disease (CKD)  Renal function at baseline. Creatinine 1.65   Paroxysmal atrial fibrillation (HCC) Does not appear to be on rate control agents Continue apixaban for stroke prevention   Coronary artery disease involving native coronary artery of native heart without angina pectoris No complaints of chest pain, EKG  nonacute Continue simvastatin, apixaban   Generalized weakness and fatigue B12 and vitamin D level within normal range   Body mass index is 33.75 kg/m.  Interventions:  Diet: Heart healthy diet DVT Prophylaxis: Eliquis  Advance goals of care discussion: Full code  Family Communication: family was present at bedside, at the time of interview.  The pt provided permission to discuss medical plan with the family. Opportunity was given to ask question and all questions were answered satisfactorily.   Disposition:  Pt is from Home, admitted with CAP, still on O2 and IV Abx, which precludes a safe discharge. Discharge to Home, when stable, most likely tomorrow a.m.   Subjective: No significant overnight events, patient's breathing is improving, has mild productive cough, no chest pain or palpitation, oral feeling better. Most likely DC tomorrow a.m. if able to wean off oxygen.   Physical Exam: General: NAD, lying comfortably Appear in no distress, affect appropriate Eyes: PERRLA ENT: Oral Mucosa Clear, moist  Neck: no JVD,  Cardiovascular: S1 and S2 Present, no Murmur,  Respiratory: Equal air entry bilaterally, crackles, no wheezes. Abdomen: Bowel Sound present, Soft and no tenderness,  Skin: no rashes Extremities: no Pedal edema, no calf tenderness Neurologic: without any new focal findings Gait not checked due to patient safety concerns  Vitals:   04/12/23 1835 04/12/23 2046 04/13/23 0539 04/13/23 0932  BP: (!) 153/76 (!) 149/70 (!) 153/87 127/65  Pulse: 98 80 100 (!) 106  Resp: 18 19 18 20   Temp: 98.4 F (36.9 C) 97.9 F (36.6 C) 98.2 F (36.8 C) 97.8 F (36.6 C)  TempSrc:  Oral Oral    SpO2: 98% 97% 95% 94%  Weight:      Height:       No intake or output data in the 24 hours ending 04/13/23 1316  Filed Weights   04/11/23 2319  Weight: 100.7 kg    Data Reviewed: I have personally reviewed and interpreted daily labs, tele strips, imagings as discussed  above. I reviewed all nursing notes, pharmacy notes, vitals, pertinent old records I have discussed plan of care as described above with RN and patient/family.  CBC: Recent Labs  Lab 04/11/23 2245 04/12/23 0500 04/13/23 0635  WBC 17.8* 21.1* 12.9*  NEUTROABS 14.9*  --   --   HGB 14.2 13.1 13.4  HCT 43.3 38.6* 40.1  MCV 103.6* 101.3* 101.8*  PLT 207 181 199   Basic Metabolic Panel: Recent Labs  Lab 04/11/23 2245 04/12/23 0500 04/13/23 0635  NA 141 140 140  K 3.3* 3.5 3.5  CL 101 101 103  CO2 26 27 27   GLUCOSE 121* 132* 120*  BUN 30* 29* 24*  CREATININE 1.89* 1.79* 1.65*  CALCIUM 9.2 8.7* 8.7*  MG  --  1.7 2.0  PHOS  --  3.2 2.8    Studies: No results found.   Scheduled Meds:  amLODipine  5 mg Oral QHS   apixaban  2.5 mg Oral BID   guaiFENesin  600 mg Oral BID   irbesartan  150 mg Oral Daily   And   hydrochlorothiazide  25 mg Oral Daily   pantoprazole  40 mg Oral QHS   simvastatin  20 mg Oral Daily   Continuous Infusions:  azithromycin 500 mg (04/13/23 0019)   cefTRIAXone (ROCEPHIN)  IV 2 g (04/12/23 2117)   PRN Meds: acetaminophen **OR** acetaminophen, albuterol, HYDROcodone-acetaminophen, ondansetron **OR** ondansetron (ZOFRAN) IV  Time spent: 35 minutes  Author: Gillis Santa. MD [facility] 04/13/2023 1:16 PM  To reach On-call, see care teams to locate the attending and reach out to them via [email or web address]. If 7PM-7AM, please contact night-coverage If you still have difficulty reaching the attending provider, please page the [facility] (Director on Call) for [facility] on amion for assistance.

## 2023-04-13 NOTE — Plan of Care (Signed)

## 2023-04-13 NOTE — Progress Notes (Signed)
PHARMACIST - PHYSICIAN COMMUNICATION  CONCERNING: Antibiotic IV to Oral Route Change Policy  RECOMMENDATION: This patient is receiving azithromycin by the intravenous route.  Based on criteria approved by the Pharmacy and Therapeutics Committee, the antibiotic(s) is/are being converted to the equivalent oral dose form(s).  DESCRIPTION: These criteria include: Patient being treated for a respiratory tract infection, urinary tract infection, cellulitis or clostridium difficile associated diarrhea if on metronidazole The patient is not neutropenic and does not exhibit a GI malabsorption state The patient is eating (either orally or via tube) and/or has been taking other orally administered medications for a least 24 hours The patient is improving clinically and has a Tmax < 100.5  If you have questions about this conversion, please contact the Pharmacy Department   Tressie Ellis 04/13/23

## 2023-04-13 NOTE — Evaluation (Signed)
Occupational Therapy Evaluation Patient Details Name: Jason Gates. MRN: [id number] DOB: December 20, 1940 Today's Date: 04/13/2023   History of Present Illness   Jason Gates. is a 83 y.o. male with medical history significant for A-fib on Eliquis, CKD 3B, HTN, CAD with history of PCI to LAD, as well as history of asbestos exposure with chronic congested breathing, brought to the ED with fever, weakness and O2 sats 85% on room air on home pulse ox.   Clinical Impressions Jason Gates was seen for OT evaluation this date. Prior to hospital admission, Jason Gates was IND. Jason Gates lives with son in 2 level home. Jason Gates currently requires MOD A don/doff B socks in sitting. IND bed mobility and hallway mobility ~200 ft. SpO2 91% at rest on RA and with ~1 min activity, desat 87% on RA with 2 min activity. Resolved with PLB. IS provided and HEP reviewed. Jason Gates near functional baseline, no skilled acute OT needs identified, all education complete, will sign off. Upon hospital discharge, recommend no OT follow up.     If plan is discharge home, recommend the following:   Help with stairs or ramp for entrance     Functional Status Assessment   Patient has had a recent decline in their functional status and demonstrates the ability to make significant improvements in function in a reasonable and predictable amount of time.     Equipment Recommendations   None recommended by OT     Recommendations for Other Services         Precautions/Restrictions   Precautions Precautions: None Recall of Precautions/Restrictions: Intact Restrictions Weight Bearing Restrictions Per Provider Order: No     Mobility Bed Mobility Overal bed mobility: Independent                  Transfers Overall transfer level: Independent                        Balance Overall balance assessment: No apparent balance deficits (not formally assessed)                                          ADL either performed or assessed with clinical judgement   ADL Overall ADL's : Needs assistance/impaired                                       General ADL Comments: MOD A don/doff B socks in sitting      Pertinent Vitals/Pain Pain Assessment Pain Assessment: No/denies pain     Extremity/Trunk Assessment Upper Extremity Assessment Upper Extremity Assessment: Overall WFL for tasks assessed   Lower Extremity Assessment Lower Extremity Assessment: Overall WFL for tasks assessed       Communication Communication Communication: No apparent difficulties   Cognition Arousal: Alert Behavior During Therapy: WFL for tasks assessed/performed Cognition: No apparent impairments                               Following commands: Intact                  Home Living Family/patient expects to be discharged to:: Private residence Living Arrangements: Children Available Help at Discharge: Family Type of Home: House  Home Layout: Two level;Bed/bath upstairs               Home Equipment: Cane - single point          Prior Functioning/Environment Prior Level of Function : Independent/Modified Independent                    OT Problem List: Decreased activity tolerance        OT Goals(Current goals can be found in the care plan section)   Acute Rehab OT Goals Patient Stated Goal: to go home OT Goal Formulation: With patient Time For Goal Achievement: 04/13/23 Potential to Achieve Goals: Good   AM-PAC OT "6 Clicks" Daily Activity     Outcome Measure Help from another person eating meals?: None Help from another person taking care of personal grooming?: None Help from another person toileting, which includes using toliet, bedpan, or urinal?: None Help from another person bathing (including washing, rinsing, drying)?: A Little Help from another person to put on and taking off regular upper body clothing?: None Help  from another person to put on and taking off regular lower body clothing?: A Lot 6 Click Score: 21   End of Session Nurse Communication: Mobility status  Activity Tolerance: Patient tolerated treatment well Patient left: in bed;with call bell/phone within reach;with family/visitor present  OT Visit Diagnosis: Unsteadiness on feet (R26.81)                Time: 0981-1914 OT Time Calculation (min): 22 min Charges:  OT General Charges $OT Visit: 1 Visit OT Evaluation $OT Eval Moderate Complexity: 1 Mod OT Treatments $Self Care/Home Management : 8-22 mins  Kathie Dike, M.S. OTR/L  04/13/23, 4:17 PM  ascom [phone]

## 2023-04-14 ENCOUNTER — Other Ambulatory Visit: Payer: Self-pay

## 2023-04-14 DIAGNOSIS — J189 Pneumonia, unspecified organism: Secondary | ICD-10-CM | POA: Diagnosis not present

## 2023-04-14 LAB — CBC
HCT: 41.5 % (ref 39.0–52.0)
Hemoglobin: 14 g/dL (ref 13.0–17.0)
MCH: 34.3 pg — ABNORMAL HIGH (ref 26.0–34.0)
MCHC: 33.7 g/dL (ref 30.0–36.0)
MCV: 101.7 fL — ABNORMAL HIGH (ref 80.0–100.0)
Platelets: 217 10*3/uL (ref 150–400)
RBC: 4.08 MIL/uL — ABNORMAL LOW (ref 4.22–5.81)
RDW: 13.5 % (ref 11.5–15.5)
WBC: 13.8 10*3/uL — ABNORMAL HIGH (ref 4.0–10.5)
nRBC: 0 % (ref 0.0–0.2)

## 2023-04-14 LAB — URIC ACID: Uric Acid, Serum: 8.2 mg/dL (ref 3.7–8.6)

## 2023-04-14 LAB — BASIC METABOLIC PANEL
Anion gap: 12 (ref 5–15)
BUN: 23 mg/dL (ref 8–23)
CO2: 26 mmol/L (ref 22–32)
Calcium: 8.9 mg/dL (ref 8.9–10.3)
Chloride: 102 mmol/L (ref 98–111)
Creatinine, Ser: 1.61 mg/dL — ABNORMAL HIGH (ref 0.61–1.24)
GFR, Estimated: 42 mL/min — ABNORMAL LOW (ref >60)
Glucose, Bld: 117 mg/dL — ABNORMAL HIGH (ref 70–99)
Potassium: 3.3 mmol/L — ABNORMAL LOW (ref 3.5–5.1)
Sodium: 140 mmol/L (ref 135–145)

## 2023-04-14 LAB — MAGNESIUM: Magnesium: 2.2 mg/dL (ref 1.7–2.4)

## 2023-04-14 LAB — PHOSPHORUS: Phosphorus: 3.3 mg/dL (ref 2.5–4.6)

## 2023-04-14 LAB — LEGIONELLA PNEUMOPHILA SEROGP 1 UR AG: L. pneumophila Serogp 1 Ur Ag: NEGATIVE

## 2023-04-14 MED ORDER — POTASSIUM CHLORIDE CRYS ER 20 MEQ PO TBCR
40.0000 meq | EXTENDED_RELEASE_TABLET | Freq: Once | ORAL | Status: AC
  Administered 2023-04-14: 40 meq via ORAL
  Filled 2023-04-14: qty 2

## 2023-04-14 MED ORDER — CYANOCOBALAMIN 500 MCG PO TABS
500.0000 ug | ORAL_TABLET | Freq: Every day | ORAL | 2 refills | Status: AC
Start: 2023-04-15 — End: 2023-07-14

## 2023-04-14 MED ORDER — GUAIFENESIN ER 600 MG PO TB12: 600.0000 mg | ORAL_TABLET | Freq: Two times a day (BID) | ORAL | 0 refills | Status: AC

## 2023-04-14 MED ORDER — AZITHROMYCIN 500 MG PO TABS
500.0000 mg | ORAL_TABLET | Freq: Every day | ORAL | 0 refills | Status: DC
  Filled 2023-04-14: qty 3, 3d supply, fill #0

## 2023-04-14 MED ORDER — CYANOCOBALAMIN 500 MCG PO TABS
500.0000 ug | ORAL_TABLET | Freq: Every day | ORAL | 2 refills | Status: DC
  Filled 2023-04-14: qty 30, 30d supply, fill #0

## 2023-04-14 MED ORDER — CEFDINIR 300 MG PO CAPS
300.0000 mg | ORAL_CAPSULE | Freq: Two times a day (BID) | ORAL | 0 refills | Status: DC
  Filled 2023-04-14: qty 10, 5d supply, fill #0

## 2023-04-14 MED ORDER — CYANOCOBALAMIN 500 MCG PO TABS
500.0000 ug | ORAL_TABLET | Freq: Every day | ORAL | Status: DC
  Administered 2023-04-14: 500 ug via ORAL
  Filled 2023-04-14: qty 1

## 2023-04-14 MED ORDER — CEFDINIR 300 MG PO CAPS: 300.0000 mg | ORAL_CAPSULE | Freq: Two times a day (BID) | ORAL | 0 refills | Status: AC

## 2023-04-14 MED ORDER — GUAIFENESIN ER 600 MG PO TB12
600.0000 mg | ORAL_TABLET | Freq: Two times a day (BID) | ORAL | 0 refills | Status: DC
  Filled 2023-04-14: qty 40, 20d supply, fill #0

## 2023-04-14 MED ORDER — AZITHROMYCIN 500 MG PO TABS: 500.0000 mg | ORAL_TABLET | Freq: Every day | ORAL | 0 refills | Status: AC

## 2023-04-14 NOTE — TOC CM/SW Note (Signed)
Transition of Care [facility]) - Inpatient Brief Assessment   Patient Details  Name: Jason Gates. MRN: [id number] Date of Birth: [birth year]  [facility]) CM/SW Contact:    Allena Katz, LCSW Phone Number: 04/14/2023, 9:33 AM   Clinical Narrative: No TOC needs at this time. PT/OT have signed off. MD trying to wean oxygen. If unable, TOC to order.    Transition of Care Asessment: Insurance and Status: Insurance coverage has been reviewed Patient has primary care physician: Yes Home environment has been reviewed: [address] [place] [address] Prior level of function:: independent Prior/Current Home Services: No current home services Social Drivers of Health Review: SDOH reviewed no interventions necessary Readmission risk has been reviewed: Yes Transition of care needs: transition of care needs identified, TOC will continue to follow

## 2023-04-14 NOTE — TOC Transition Note (Signed)
Transition of Care [facility]) - Discharge Note   Patient Details  Name: Jason Gates. MRN: [id number] Date of Birth: September 04, 1940  [facility]) CM/SW Contact:  Allena Katz, LCSW Phone Number: 04/14/2023, 2:04 PM   Clinical Narrative:   Pt discharging home with self care. Active with [employer]. Jon with [employer] tried to bring tank to bedside but pt has declined the need and states he has a tank at home.    Final next level of care: Home/Self Care Barriers to Discharge: Barriers Resolved   Patient Goals and CMS Choice Patient states their goals for this hospitalization and ongoing recovery are:: return home CMS Medicare.gov Compare Post Acute Care list provided to:: Patient        Discharge Placement                       Discharge Plan and Services Additional resources added to the After Visit Summary for                  DME Arranged: Oxygen DME Agency: [employer] Date DME Agency Contacted: 04/14/23   Representative spoke with at DME Agency: jon            Social Drivers of Health (SDOH) Interventions SDOH Screenings   Food Insecurity: No Food Insecurity (04/13/2023)  Housing: Low Risk  (04/13/2023)  Transportation Needs: No Transportation Needs (04/13/2023)  Utilities: Not At Risk (04/13/2023)  Financial Resource Strain: Low Risk  (09/08/2022)   Received from [facility]  Social Connections: Moderately Integrated (04/13/2023)  Tobacco Use: Medium Risk (04/11/2023)     Readmission Risk Interventions     No data to display

## 2023-04-14 NOTE — Evaluation (Signed)
Physical Therapy Evaluation Patient Details Name: Jason Gates. MRN: [id number] DOB: 11/20/1940 Today's Date: 04/14/2023  History of Present Illness  Jason Gates. is a 83 y.o. male with medical history significant for A-fib on Eliquis, CKD 3B, HTN, CAD with history of PCI to LAD, as well as history of asbestos exposure with chronic congested breathing, brought to the ED with fever, weakness and O2 sats 85% on room air on home pulse ox.  MD assessment includes: CAP, acute respiratory failure with hypoxia, hypertensive urgency, and generalized weakness and fatigue.   Clinical Impression  Pt was pleasant and motivated to participate during the session and put forth good effort throughout. O2 qualification results as follows:  SpO2 on room air at rest = 92% SpO2 on room air while ambulating = 86% SpO2 on 1 liter of O2 while ambulating = 90%   Pt demonstrated good control and stability with transfers but with 5/10 resting foot pain, primarily the right, increasing to 10/10 with weight bearing.  Pt education and traininig provided on step-to pattern with RW to address R foot pain with weight bearing with pt able to demonstrate good carryover of proper sequencing; pt self-selected to transition away from using the RW despite the pain control that it offered and progressed to amb without an AD with pt stating the pain did ease off somewhat but was still high.  Pt presented with mild drifting left/right with gait without the RW but was able to self-correct.  Recommended follow up PT services once patient is discharged to address mild instability with gait with pt declining.  Pt will benefit from continued PT services upon discharge to safely address deficits listed in patient problem list for decreased caregiver assistance and eventual return to PLOF.          If plan is discharge home, recommend the following: A little help with walking and/or transfers;Assistance with cooking/housework;A  little help with bathing/dressing/bathroom;Help with stairs or ramp for entrance;Assist for transportation   Can travel by private vehicle        Equipment Recommendations None recommended by PT  Recommendations for Other Services       Functional Status Assessment Patient has had a recent decline in their functional status and demonstrates the ability to make significant improvements in function in a reasonable and predictable amount of time.     Precautions / Restrictions Precautions Precautions: None Recall of Precautions/Restrictions: Intact Restrictions Weight Bearing Restrictions Per Provider Order: No      Mobility  Bed Mobility               General bed mobility comments: NT    Transfers Overall transfer level: Independent Equipment used: None               General transfer comment: Good eccentric and concentric control and stability    Ambulation/Gait Ambulation/Gait assistance: Supervision Gait Distance (Feet): 80 Feet Assistive device: Rolling walker (2 wheels), None Gait Pattern/deviations: Step-to pattern, Step-through pattern, Decreased stance time - right, Decreased step length - left, Decreased step length - right, Drifts right/left Gait velocity: decreased     General Gait Details: Pt education and traininig provided on step-to pattern to address R foot pain with weight bearing with pt able to demonstrate good carryover of proper sequencing; pt self-selected to transition away from using the RW despite pain control that it offered and progressed to amb without an AD  Stairs  Wheelchair Mobility     Tilt Bed    Modified Rankin (Stroke Patients Only)       Balance Overall balance assessment: Needs assistance   Sitting balance-Leahy Scale: Normal     Standing balance support: No upper extremity supported, During functional activity Standing balance-Leahy Scale: Good                                Pertinent Vitals/Pain Pain Assessment Pain Assessment: 0-10 Pain Score: 5  Pain Location: L great toe and R mid foot 5/10 at rest and 10/10 with weight bearing Pain Descriptors / Indicators: Sore, Aching Pain Intervention(s): Monitored during session, Premedicated before session    Home Living Family/patient expects to be discharged to:: Private residence   Available Help at Discharge: Family;Available 24 hours/day Type of Home: House Home Access: Stairs to enter Entrance Stairs-Rails: None Entrance Stairs-Number of Steps: 2 Alternate Level Stairs-Number of Steps: flight Home Layout: Two level;Able to live on main level with bedroom/bathroom Home Equipment: Cane - single point;Rolling Walker (2 wheels)      Prior Function Prior Level of Function : Independent/Modified Independent             Mobility Comments: Ind amb community distances without an AD ADLs Comments: Ind with ADLs     Extremity/Trunk Assessment   Upper Extremity Assessment Upper Extremity Assessment: Overall WFL for tasks assessed    Lower Extremity Assessment Lower Extremity Assessment: Overall WFL for tasks assessed       Communication   Communication Communication: No apparent difficulties    Cognition Arousal: Alert Behavior During Therapy: WFL for tasks assessed/performed   PT - Cognitive impairments: No apparent impairments                         Following commands: Intact       Cueing       General Comments      Exercises     Assessment/Plan    PT Assessment Patient needs continued PT services  PT Problem List Decreased activity tolerance;Decreased balance;Decreased mobility;Decreased knowledge of use of DME;Pain       PT Treatment Interventions DME instruction;Gait training;Stair training;Functional mobility training;Therapeutic activities;Therapeutic exercise;Balance training;Patient/family education    PT Goals (Current goals can be found in the  Care Plan section)  Acute Rehab PT Goals Patient Stated Goal: Decreased foot pain with ambulation PT Goal Formulation: With patient Time For Goal Achievement: 04/27/23 Potential to Achieve Goals: Good    Frequency Min 1X/week     Co-evaluation               AM-PAC PT "6 Clicks" Mobility  Outcome Measure Help needed turning from your back to your side while in a flat bed without using bedrails?: None Help needed moving from lying on your back to sitting on the side of a flat bed without using bedrails?: None Help needed moving to and from a bed to a chair (including a wheelchair)?: None Help needed standing up from a chair using your arms (e.g., wheelchair or bedside chair)?: None Help needed to walk in hospital room?: A Little Help needed climbing 3-5 steps with a railing? : A Little 6 Click Score: 22    End of Session Equipment Utilized During Treatment: Gait belt;Oxygen Activity Tolerance: Patient tolerated treatment well Patient left: in bed;with family/visitor present;with call bell/phone within reach   PT Visit Diagnosis: Unsteadiness  on feet (R26.81);Difficulty in walking, not elsewhere classified (R26.2);Pain Pain - Right/Left: Right Pain - part of body: Ankle and joints of foot    Time: 0981-1914 PT Time Calculation (min) (ACUTE ONLY): 29 min   Charges:   PT Evaluation $PT Eval Moderate Complexity: 1 Mod PT Treatments $Gait Training: 8-22 mins PT General Charges $$ ACUTE PT VISIT: 1 Visit       D. Scott Lilliemae Fruge PT, DPT 04/14/23, 10:43 AM

## 2023-04-14 NOTE — Plan of Care (Signed)
  Problem: Education: Goal: Knowledge of General Education information will improve Description: Including pain rating scale, medication(s)/side effects and non-pharmacologic comfort measures Outcome: Progressing   Problem: Health Behavior/Discharge Planning: Goal: Ability to manage health-related needs will improve Outcome: Progressing   Problem: Clinical Measurements: Goal: Respiratory complications will improve Outcome: Progressing   

## 2023-04-14 NOTE — Discharge Summary (Signed)
[facility] Discharge Summary   Patient: Jason Gates. NGE:[id number]  PCP: Carren Rang, PA-C  Date of admission: 04/11/2023   Date of discharge: 04/14/2023     Discharge Diagnoses:  Principal Problem:   CAP (community acquired pneumonia) Active Problems:   Acute respiratory failure with hypoxia (HCC)   Hypertensive urgency   Coronary artery disease involving native coronary artery of native heart without angina pectoris   Paroxysmal atrial fibrillation (HCC)   Stage 3b chronic kidney disease (CKD) (HCC)   Admitted From: Home Disposition: Home  Recommendations for Outpatient Follow-up:  Follow-up with PCP in 1 week, repeat chest x-ray after 4 weeks for resolution of pneumonia Follow up LABS/TEST: CXR in 4 weeks    Follow-up Information     Carren Rang, PA-C Follow up.   Specialty: Physician Assistant Why: Hospital follow up Contact information: [address] [place] [address] [phone]         Carren Rang, PA-C Follow up in 1 week(s).   Specialty: Physician Assistant Contact information: [address] [place] [address] [phone]                Diet recommendation: Cardiac diet  Activity: The patient is advised to gradually reintroduce usual activities, as tolerated  Discharge Condition: stable  Code Status: Full code   History of present illness: As per the H and P dictated on admission Hospital Course:  Jason Conery. is a 83 y.o. male with medical history significant for A-fib on Eliquis, CKD 3B, HTN, CAD with history of PCI to LAD, as well as history of asbestos exposure with chronic congested breathing, brought to the ED with fever, weakness and O2 sats 85% on room air on home pulse ox.  Patient has a congested cough and wheezing and outpatient flu test have been negative. ED course and data review: BP 190/66, respirations 22 with O2 sat 85% on room air.   Afebrile, pulse 93 Workup significant for WBC 17,000 with lactic acid 1.2.  Negative COVID, flu and RSV Creatinine 1.9 which is about baseline.  Potassium 3.3 EKG, personally viewed and interpreted showing A-fib at 103 with PVCs Chest x-ray shows patchy bilateral lower lobe airspace disease typical of pneumonia. Patient started on Rocephin and azithromycin and was given an LR bolus. Hospitalist consulted for admission.      Assessment and Plan:   # CAP (community acquired pneumonia) # Acute respiratory failure with hypoxia S/p Continue Rocephin and azithromycin and Antitussives, albuterol as needed, Incentive spirometer. Supplemental oxygen. RVP panel negative 2/17 still on supplemental O2 relation 1.5 L via [place] 2/18 breathing improved, home oxygen arranged.  Patient was discharged on azithromycin 500 mg p.o. daily for 3 days and Omnicef 300 mg p.o. twice daily for 5 days.  Follow-up with PCP to repeat chest x-ray after 4 weeks.  # Hypertensive urgency: Continue home amlodipine and valsartan.  Monitor BP at home and follow with PCP. # Stage 3b chronic kidney disease (CKD) Renal function at baseline. Creatinine 1.61  # Paroxysmal atrial fibrillation: Does not appear to be on rate control agents. Continue apixaban for stroke prevention # CAD, No complaints of chest pain, EKG nonacute. Continue simvastatin, apixaban # Generalized weakness and fatigue: B12 and vitamin D level within normal range # Obesity, class I Body mass index is 33.75 kg/m.  Nutrition Interventions:  Patient was seen by physical therapy, who recommended no therapy needed on discharge,  On the day of  the discharge the patient's vitals were stable, and no other acute medical condition were reported by patient. the patient was felt safe to be discharge at Home with oxygen.  Consultants: None Procedures: None  Discharge Exam: General: Appear in no distress, no Rash; Oral Mucosa Clear, moist. Cardiovascular: S1 and S2  Present, no Murmur, Respiratory: normal respiratory effort, Bilateral Air entry present and no Crackles, no wheezes Abdomen: Bowel Sound present, Soft and no tenderness, no hernia Extremities: no Pedal edema, no calf tenderness Neurology: alert and oriented to time, place, and person affect appropriate.  Filed Weights   04/11/23 2319  Weight: 100.7 kg   Vitals:   04/14/23 0554 04/14/23 0813  BP: 135/76 132/62  Pulse: (!) 109 (!) 101  Resp:  20  Temp: 99 F (37.2 C) 98.1 F (36.7 C)  SpO2: 95% 95%    DISCHARGE MEDICATION: Allergies as of 04/14/2023       Reactions   K Phos Mono-sod Phos Di & Mono Diarrhea   Sulfur Dioxide Hives   Darunavir    Other Reaction(s): Urticaria, Eruption of skin, Urticaria, Eruption of skin   Elemental Sulfur Other (See Comments)   intolerance   Phosphorus Other (See Comments)   intolerance   Potassium Phosphate    Other reaction(s): Diarrhea   Amoxicillin Rash, Hives        Medication List     STOP taking these medications    aspirin 81 MG tablet   oseltamivir 30 MG capsule Commonly known as: TAMIFLU   valsartan-hydrochlorothiazide 160-25 MG tablet Commonly known as: DIOVAN-HCT       TAKE these medications    amLODipine 5 MG tablet Commonly known as: NORVASC Take 5 mg by mouth at bedtime.   augmented betamethasone dipropionate 0.05 % cream Commonly known as: DIPROLENE-AF Apply topically 2 (two) times daily as needed.   azithromycin 500 MG tablet Commonly known as: Zithromax Take 1 tablet (500 mg total) by mouth daily for 3 days.   BLADDER 2.2 PO Take 1 tablet by mouth daily.   cefdinir 300 MG capsule Commonly known as: OMNICEF Take 1 capsule (300 mg total) by mouth 2 (two) times daily for 5 days.   cyanocobalamin 500 MCG tablet Commonly known as: VITAMIN B12 Take 1 tablet (500 mcg total) by mouth daily. Start taking on: April 15, 2023   Eliquis 2.5 MG Tabs tablet Generic drug: apixaban TAKE 1 TABLET  TWICE A DAY   guaiFENesin 600 MG 12 hr tablet Commonly known as: MUCINEX Take 1 tablet (600 mg total) by mouth 2 (two) times daily for 5 days.   pantoprazole 40 MG tablet Commonly known as: PROTONIX Take 1 tablet (40 mg total) by mouth at bedtime.   sildenafil 20 MG tablet Commonly known as: REVATIO Take 1 to 5 tablets daily as needed 1 hour prior to intercourse   simvastatin 20 MG tablet Commonly known as: ZOCOR Take 20 mg by mouth daily.   valACYclovir 500 MG tablet Commonly known as: VALTREX Take 500 mg by mouth at bedtime.   valsartan 160 MG tablet Commonly known as: DIOVAN Take 160 mg by mouth daily.   Vevye 0.1 % Soln Generic drug: cycloSPORINE Apply to eye.   Vitamin D 125 MCG (5000 UT) Caps Take 5,000 Units by mouth daily.               Durable Medical Equipment  (From admission, onward)           Start  Ordered   04/14/23 1016  For home use only DME oxygen  Once       Question Answer Comment  Length of Need 6 Months   Mode or (Route) Nasal cannula   Liters per Minute 2   Frequency Continuous (stationary and portable oxygen unit needed)   Oxygen conserving device Yes   Oxygen delivery system Gas      04/14/23 1015           Allergies  Allergen Reactions   K Phos Mono-Sod Phos Di & Mono Diarrhea   Sulfur Dioxide Hives   Darunavir     Other Reaction(s): Urticaria, Eruption of skin, Urticaria, Eruption of skin   Elemental Sulfur Other (See Comments)    intolerance   Phosphorus Other (See Comments)    intolerance   Potassium Phosphate     Other reaction(s): Diarrhea   Amoxicillin Rash and Hives   Discharge Instructions     Call MD for:  difficulty breathing, headache or visual disturbances   Complete by: As directed    Call MD for:  extreme fatigue   Complete by: As directed    Call MD for:  persistant dizziness or light-headedness   Complete by: As directed    Call MD for:  persistant nausea and vomiting   Complete by: As  directed    Call MD for:  severe uncontrolled pain   Complete by: As directed    Call MD for:  temperature >100.4   Complete by: As directed    Diet - low sodium heart healthy   Complete by: As directed    Discharge instructions   Complete by: As directed    Follow-up with PCP in 1 week, repeat chest x-ray after 4 weeks for resolution of pneumonia   Increase activity slowly   Complete by: As directed        The results of significant diagnostics from this hospitalization (including imaging, microbiology, ancillary and laboratory) are listed below for reference.    Significant Diagnostic Studies: DG Chest 1 View Result Date: 04/11/2023 CLINICAL DATA:  Cough.  Fever and weakness. EXAM: CHEST  1 VIEW COMPARISON:  01/21/2023 FINDINGS: Patchy bilateral lower lobe airspace disease suspicious for pneumonia. Stable heart size and mediastinal contours. No pleural fluid or pneumothorax. No pulmonary edema. No acute osseous findings. IMPRESSION: Patchy bilateral lower lobe airspace disease typical of pneumonia. Electronically Signed   By: Narda Rutherford M.D.   On: 04/11/2023 22:51    Microbiology: Recent Results (from the past 240 hours)  Resp panel by RT-PCR (RSV, Flu A&B, Covid) Anterior Nasal Swab     Status: None   Collection Time: 04/11/23 10:17 PM   Specimen: Anterior Nasal Swab  Result Value Ref Range Status   SARS Coronavirus 2 by RT PCR NEGATIVE NEGATIVE Final    Comment: (NOTE) SARS-CoV-2 target nucleic acids are NOT DETECTED.  The SARS-CoV-2 RNA is generally detectable in upper respiratory specimens during the acute phase of infection. The lowest concentration of SARS-CoV-2 viral copies this assay can detect is 138 copies/mL. A negative result does not preclude SARS-Cov-2 infection and should not be used as the sole basis for treatment or other patient management decisions. A negative result may occur with  improper specimen collection/handling, submission of specimen  other than nasopharyngeal swab, presence of viral mutation(s) within the areas targeted by this assay, and inadequate number of viral copies(<138 copies/mL). A negative result must be combined with clinical observations, patient history, and epidemiological information. The  expected result is Negative.  Fact Sheet for Patients:  BloggerCourse.com  Fact Sheet for Healthcare Providers:  SeriousBroker.it  This test is no t yet approved or cleared by the [place] FDA and  has been authorized for detection and/or diagnosis of SARS-CoV-2 by FDA under an Emergency Use Authorization (EUA). This EUA will remain  in effect (meaning this test can be used) for the duration of the COVID-19 declaration under Section 564(b)(1) of the Act, 21 U.S.C.section 360bbb-3(b)(1), unless the authorization is terminated  or revoked sooner.       Influenza A by PCR NEGATIVE NEGATIVE Final   Influenza B by PCR NEGATIVE NEGATIVE Final    Comment: (NOTE) The Xpert Xpress SARS-CoV-2/FLU/RSV plus assay is intended as an aid in the diagnosis of influenza from Nasopharyngeal swab specimens and should not be used as a sole basis for treatment. Nasal washings and aspirates are unacceptable for Xpert Xpress SARS-CoV-2/FLU/RSV testing.  Fact Sheet for Patients: BloggerCourse.com  Fact Sheet for Healthcare Providers: SeriousBroker.it  This test is not yet approved or cleared by the [place] FDA and has been authorized for detection and/or diagnosis of SARS-CoV-2 by FDA under an Emergency Use Authorization (EUA). This EUA will remain in effect (meaning this test can be used) for the duration of the COVID-19 declaration under Section 564(b)(1) of the Act, 21 U.S.C. section 360bbb-3(b)(1), unless the authorization is terminated or revoked.     Resp Syncytial Virus by PCR NEGATIVE NEGATIVE Final     Comment: (NOTE) Fact Sheet for Patients: BloggerCourse.com  Fact Sheet for Healthcare Providers: SeriousBroker.it  This test is not yet approved or cleared by the [place] FDA and has been authorized for detection and/or diagnosis of SARS-CoV-2 by FDA under an Emergency Use Authorization (EUA). This EUA will remain in effect (meaning this test can be used) for the duration of the COVID-19 declaration under Section 564(b)(1) of the Act, 21 U.S.C. section 360bbb-3(b)(1), unless the authorization is terminated or revoked.  Performed at [facility], [address], [place], [place] [address]   Blood Culture (routine x 2)     Status: None (Preliminary result)   Collection Time: 04/11/23 10:47 PM   Specimen: Right Antecubital; Blood  Result Value Ref Range Status   Specimen Description RIGHT ANTECUBITAL  Final   Special Requests   Final    BOTTLES DRAWN AEROBIC AND ANAEROBIC Blood Culture results may not be optimal due to an inadequate volume of blood received in culture bottles   Culture   Final    NO GROWTH 3 DAYS Performed at [facility], [address], [place], [place] [address]    Report Status PENDING  Incomplete  Blood Culture (routine x 2)     Status: None (Preliminary result)   Collection Time: 04/11/23 11:25 PM   Specimen: Left Antecubital; Blood  Result Value Ref Range Status   Specimen Description LEFT ANTECUBITAL  Final   Special Requests   Final    BOTTLES DRAWN AEROBIC AND ANAEROBIC Blood Culture results may not be optimal due to an inadequate volume of blood received in culture bottles   Culture   Final    NO GROWTH 3 DAYS Performed at [facility], [address] [place], [place] [address]    Report Status PENDING  Incomplete  Respiratory (~20 pathogens) panel by PCR     Status: None   Collection Time: 04/12/23  8:59 AM   Specimen: Nasopharyngeal Swab; Respiratory  Result  Value Ref Range  Status   Adenovirus NOT DETECTED NOT DETECTED Final   Coronavirus 229E NOT DETECTED NOT DETECTED Final    Comment: (NOTE) The Coronavirus on the Respiratory Panel, DOES NOT test for the novel  Coronavirus (2019 nCoV)    Coronavirus HKU1 NOT DETECTED NOT DETECTED Final   Coronavirus NL63 NOT DETECTED NOT DETECTED Final   Coronavirus OC43 NOT DETECTED NOT DETECTED Final   Metapneumovirus NOT DETECTED NOT DETECTED Final   Rhinovirus / Enterovirus NOT DETECTED NOT DETECTED Final   Influenza A NOT DETECTED NOT DETECTED Final   Influenza B NOT DETECTED NOT DETECTED Final   Parainfluenza Virus 1 NOT DETECTED NOT DETECTED Final   Parainfluenza Virus 2 NOT DETECTED NOT DETECTED Final   Parainfluenza Virus 3 NOT DETECTED NOT DETECTED Final   Parainfluenza Virus 4 NOT DETECTED NOT DETECTED Final   Respiratory Syncytial Virus NOT DETECTED NOT DETECTED Final   Bordetella pertussis NOT DETECTED NOT DETECTED Final   Bordetella Parapertussis NOT DETECTED NOT DETECTED Final   Chlamydophila pneumoniae NOT DETECTED NOT DETECTED Final   Mycoplasma pneumoniae NOT DETECTED NOT DETECTED Final    Comment: Performed at [facility], [address], [place], [place] [address]     Labs: CBC: Recent Labs  Lab 04/11/23 2245 04/12/23 0500 04/13/23 0635 04/14/23 0540  WBC 17.8* 21.1* 12.9* 13.8*  NEUTROABS 14.9*  --   --   --   HGB 14.2 13.1 13.4 14.0  HCT 43.3 38.6* 40.1 41.5  MCV 103.6* 101.3* 101.8* 101.7*  PLT 207 181 199 217   Basic Metabolic Panel: Recent Labs  Lab 04/11/23 2245 04/12/23 0500 04/13/23 0635 04/14/23 0540  NA 141 140 140 140  K 3.3* 3.5 3.5 3.3*  CL 101 101 103 102  CO2 26 27 27 26   GLUCOSE 121* 132* 120* 117*  BUN 30* 29* 24* 23  CREATININE 1.89* 1.79* 1.65* 1.61*  CALCIUM 9.2 8.7* 8.7* 8.9  MG  --  1.7 2.0 2.2  PHOS  --  3.2 2.8 3.3   Liver Function Tests: Recent Labs  Lab 04/11/23 2245  AST 28  ALT 22  ALKPHOS 53  BILITOT 1.3*  PROT  7.1  ALBUMIN 4.2   No results for input(s): "LIPASE", "AMYLASE" in the last 168 hours. No results for input(s): "AMMONIA" in the last 168 hours. Cardiac Enzymes: No results for input(s): "CKTOTAL", "CKMB", "CKMBINDEX", "TROPONINI" in the last 168 hours. BNP (last 3 results) No results for input(s): "BNP" in the last 8760 hours. CBG: No results for input(s): "GLUCAP" in the last 168 hours.  Time spent: 35 minutes  Signed:  Gillis Santa  [facility]  04/14/2023 1:55 PM

## 2023-04-14 NOTE — Care Management Important Message (Signed)
Important Message  Patient Details  Name: Jason Gates. MRN: [id number] Date of Birth: [birth year]   Important Message Given:  Yes - Medicare IM     Cristela Blue, CMA 04/14/2023, 10:29 AM

## 2023-04-16 LAB — CULTURE, BLOOD (ROUTINE X 2)
Culture: NO GROWTH
Culture: NO GROWTH

## 2023-06-08 ENCOUNTER — Other Ambulatory Visit: Payer: Self-pay | Admitting: Pulmonary Disease

## 2023-06-08 DIAGNOSIS — T17500A Unspecified foreign body in bronchus causing asphyxiation, initial encounter: Secondary | ICD-10-CM

## 2023-06-10 ENCOUNTER — Ambulatory Visit (INDEPENDENT_AMBULATORY_CARE_PROVIDER_SITE_OTHER): Payer: Self-pay | Admitting: Urology

## 2023-06-10 ENCOUNTER — Encounter: Payer: Self-pay | Admitting: Urology

## 2023-06-10 VITALS — BP 155/68 | HR 71 | Ht 68.0 in | Wt 229.0 lb

## 2023-06-10 DIAGNOSIS — R3912 Poor urinary stream: Secondary | ICD-10-CM

## 2023-06-10 DIAGNOSIS — N401 Enlarged prostate with lower urinary tract symptoms: Secondary | ICD-10-CM | POA: Diagnosis not present

## 2023-06-10 DIAGNOSIS — R350 Frequency of micturition: Secondary | ICD-10-CM | POA: Diagnosis not present

## 2023-06-10 LAB — MICROSCOPIC EXAMINATION: Bacteria, UA: NONE SEEN

## 2023-06-10 LAB — URINALYSIS, COMPLETE
Bilirubin, UA: NEGATIVE
Glucose, UA: NEGATIVE
Ketones, UA: NEGATIVE
Leukocytes,UA: NEGATIVE
Nitrite, UA: NEGATIVE
Protein,UA: NEGATIVE
RBC, UA: NEGATIVE
Specific Gravity, UA: 1.02 (ref 1.005–1.030)
Urobilinogen, Ur: 0.2 mg/dL (ref 0.2–1.0)
pH, UA: 5 (ref 5.0–7.5)

## 2023-06-10 MED ORDER — ALFUZOSIN HCL ER 10 MG PO TB24: 10.0000 mg | ORAL_TABLET | Freq: Every day | ORAL | 6 refills | Status: DC

## 2023-06-10 NOTE — Progress Notes (Signed)
   06/10/23 CC:  Chief Complaint  Patient presents with   Cysto     HPI: Jason Gates. is a 83 y.o. male with a personal history of CKD stage 3b, bladder cancer, CIS, and proteinuria, who presents today for annual cysto.   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  He underwent a TURBT in 2014 and 2018 it was high- grade he had BCG x3 that he didn't tolerate.   He denies any dysuria or gross hematuria.  Urinalysis today is negative.  He has been admitted multiple times for pneumonia.  Currently on biotics and prednisone.  He reports today worsening of his urinary stream and occasionally having to push to empty.  He never did try Rapaflo as he was worried about potential side effects.  He did not tolerate Flomax.  Vitals:   06/10/23 1306  BP: (!) 155/68  Pulse: 71   NED. A&Ox3.   No respiratory distress   Abd soft, NT, ND Normal phallus with bilateral descended testicles  Cystoscopy Procedure Note  Patient identification was confirmed, informed consent was obtained, and patient was prepped using Betadine solution.  Lidocaine jelly was administered per urethral meatus.     Pre-Procedure: - Inspection reveals a normal caliber ureteral meatus.  Procedure: The flexible cystoscope was introduced without difficulty - No urethral strictures/lesions are present. - mildly enlarged prostate -some minor bleeding with cystoscopic manipulation - Normal bladder neck - Bilateral ureteral orifices identified - Bladder mucosa  multiple TUR stellate scars but no tumors  - No bladder stones - Mild trabeculation  Retroflexion shows NED today    Post-Procedure: - Patient tolerated the procedure well   Assessment/ Plan:  History of bladder cancer  - -S/p TURBT in 2014 and 2018.  - Cystoscopy showed NED  - Shared decision making on whether or not to continue to pursue surveillance cystoscopy, elected to continue annually  2.  BPH with weak  stream - Worsening obstructive symptoms - Discussed today in detail, options include pharmacotherapy versus procedural intervention.  He may be a good candidate for UroLift.  He is not interested in surgeries or procedures.  He would like to try Rapaflo as he never tried it before.  He is advised to let us  know if he is unable to tolerate it or it is ineffective. - Urinary frequency likely exacerbated by diuretic  Follow-up annually for cystoscopy or sooner as needed   Dustin Gimenez, MD

## 2023-06-12 ENCOUNTER — Ambulatory Visit
Admission: RE | Admit: 2023-06-12 | Discharge: 2023-06-12 | Disposition: A | Discharge status: Home / Self Care | Source: Ambulatory Visit | Attending: Pulmonary Disease | Admitting: Pulmonary Disease

## 2023-06-12 DIAGNOSIS — T17500A Unspecified foreign body in bronchus causing asphyxiation, initial encounter: Secondary | ICD-10-CM

## 2023-07-30 ENCOUNTER — Emergency Department

## 2023-07-30 ENCOUNTER — Encounter: Payer: Self-pay | Admitting: Internal Medicine

## 2023-07-30 ENCOUNTER — Other Ambulatory Visit: Payer: Self-pay

## 2023-07-30 ENCOUNTER — Observation Stay
Admission: EM | Admit: 2023-07-30 | Discharge: 2023-07-31 | Disposition: A | Discharge status: Home / Self Care | Attending: Internal Medicine | Admitting: Internal Medicine

## 2023-07-30 DIAGNOSIS — J189 Pneumonia, unspecified organism: Secondary | ICD-10-CM | POA: Diagnosis present

## 2023-07-30 DIAGNOSIS — G4700 Insomnia, unspecified: Secondary | ICD-10-CM | POA: Insufficient documentation

## 2023-07-30 DIAGNOSIS — N189 Chronic kidney disease, unspecified: Secondary | ICD-10-CM

## 2023-07-30 DIAGNOSIS — I5032 Chronic diastolic (congestive) heart failure: Secondary | ICD-10-CM

## 2023-07-30 DIAGNOSIS — Z7901 Long term (current) use of anticoagulants: Secondary | ICD-10-CM | POA: Insufficient documentation

## 2023-07-30 DIAGNOSIS — I480 Paroxysmal atrial fibrillation: Secondary | ICD-10-CM | POA: Diagnosis not present

## 2023-07-30 DIAGNOSIS — Z8701 Personal history of pneumonia (recurrent): Secondary | ICD-10-CM | POA: Diagnosis not present

## 2023-07-30 DIAGNOSIS — I130 Hypertensive heart and chronic kidney disease with heart failure and stage 1 through stage 4 chronic kidney disease, or unspecified chronic kidney disease: Secondary | ICD-10-CM | POA: Diagnosis not present

## 2023-07-30 DIAGNOSIS — R079 Chest pain, unspecified: Principal | ICD-10-CM

## 2023-07-30 DIAGNOSIS — I214 Non-ST elevation (NSTEMI) myocardial infarction: Secondary | ICD-10-CM | POA: Diagnosis not present

## 2023-07-30 DIAGNOSIS — Z8546 Personal history of malignant neoplasm of prostate: Secondary | ICD-10-CM | POA: Insufficient documentation

## 2023-07-30 DIAGNOSIS — R7989 Other specified abnormal findings of blood chemistry: Secondary | ICD-10-CM | POA: Diagnosis not present

## 2023-07-30 DIAGNOSIS — N1831 Chronic kidney disease, stage 3a: Secondary | ICD-10-CM | POA: Diagnosis not present

## 2023-07-30 DIAGNOSIS — N179 Acute kidney failure, unspecified: Secondary | ICD-10-CM | POA: Insufficient documentation

## 2023-07-30 DIAGNOSIS — I2510 Atherosclerotic heart disease of native coronary artery without angina pectoris: Secondary | ICD-10-CM

## 2023-07-30 DIAGNOSIS — Z8673 Personal history of transient ischemic attack (TIA), and cerebral infarction without residual deficits: Secondary | ICD-10-CM | POA: Diagnosis not present

## 2023-07-30 DIAGNOSIS — Z79899 Other long term (current) drug therapy: Secondary | ICD-10-CM | POA: Insufficient documentation

## 2023-07-30 DIAGNOSIS — I10 Essential (primary) hypertension: Secondary | ICD-10-CM | POA: Diagnosis present

## 2023-07-30 DIAGNOSIS — I5030 Unspecified diastolic (congestive) heart failure: Secondary | ICD-10-CM | POA: Insufficient documentation

## 2023-07-30 DIAGNOSIS — I200 Unstable angina: Secondary | ICD-10-CM | POA: Diagnosis not present

## 2023-07-30 DIAGNOSIS — H905 Unspecified sensorineural hearing loss: Secondary | ICD-10-CM | POA: Insufficient documentation

## 2023-07-30 DIAGNOSIS — G4733 Obstructive sleep apnea (adult) (pediatric): Secondary | ICD-10-CM | POA: Insufficient documentation

## 2023-07-30 HISTORY — DX: Solitary pulmonary nodule: R91.1

## 2023-07-30 HISTORY — DX: Cerebral infarction, unspecified: I63.9

## 2023-07-30 HISTORY — DX: Paroxysmal atrial fibrillation: I48.0

## 2023-07-30 HISTORY — DX: Chronic kidney disease, stage 3 unspecified: N18.30

## 2023-07-30 HISTORY — DX: Essential (primary) hypertension: I10

## 2023-07-30 HISTORY — DX: Hyperlipidemia, unspecified: E78.5

## 2023-07-30 HISTORY — DX: Gastro-esophageal reflux disease without esophagitis: K21.9

## 2023-07-30 HISTORY — DX: Obstructive sleep apnea (adult) (pediatric): G47.33

## 2023-07-30 HISTORY — DX: Supraventricular tachycardia, unspecified: I47.10

## 2023-07-30 HISTORY — DX: Atherosclerotic heart disease of native coronary artery without angina pectoris: I25.10

## 2023-07-30 HISTORY — DX: Other ill-defined heart diseases: I51.89

## 2023-07-30 LAB — BASIC METABOLIC PANEL WITH GFR
Anion gap: 9 (ref 5–15)
BUN: 51 mg/dL — ABNORMAL HIGH (ref 8–23)
CO2: 27 mmol/L (ref 22–32)
Calcium: 9.1 mg/dL (ref 8.9–10.3)
Chloride: 106 mmol/L (ref 98–111)
Creatinine, Ser: 2.59 mg/dL — ABNORMAL HIGH (ref 0.61–1.24)
GFR, Estimated: 24 mL/min — ABNORMAL LOW (ref >60)
Glucose, Bld: 106 mg/dL — ABNORMAL HIGH (ref 70–99)
Potassium: 3.7 mmol/L (ref 3.5–5.1)
Sodium: 142 mmol/L (ref 135–145)

## 2023-07-30 LAB — CBC
HCT: 45 % (ref 39.0–52.0)
Hemoglobin: 14.7 g/dL (ref 13.0–17.0)
MCH: 34.3 pg — ABNORMAL HIGH (ref 26.0–34.0)
MCHC: 32.7 g/dL (ref 30.0–36.0)
MCV: 105.1 fL — ABNORMAL HIGH (ref 80.0–100.0)
Platelets: 224 10*3/uL (ref 150–400)
RBC: 4.28 MIL/uL (ref 4.22–5.81)
RDW: 14.8 % (ref 11.5–15.5)
WBC: 12.5 10*3/uL — ABNORMAL HIGH (ref 4.0–10.5)
nRBC: 0 % (ref 0.0–0.2)

## 2023-07-30 LAB — TROPONIN I (HIGH SENSITIVITY)
Troponin I (High Sensitivity): 29 ng/L — ABNORMAL HIGH (ref <18)
Troponin I (High Sensitivity): 24 ng/L — ABNORMAL HIGH (ref <18)

## 2023-07-30 LAB — HEPARIN LEVEL (UNFRACTIONATED): Heparin Unfractionated: >1.1 [IU]/mL — ABNORMAL HIGH (ref 0.30–0.70)

## 2023-07-30 LAB — APTT
aPTT: 25 s (ref 24–36)
aPTT: 89 s — ABNORMAL HIGH (ref 24–36)

## 2023-07-30 LAB — PROTIME-INR
INR: 1 (ref 0.8–1.2)
Prothrombin Time: 13.5 s (ref 11.4–15.2)

## 2023-07-30 MED ORDER — VALACYCLOVIR HCL 500 MG PO TABS
500.0000 mg | ORAL_TABLET | Freq: Every day | ORAL | Status: DC
  Administered 2023-07-30: 500 mg via ORAL
  Filled 2023-07-30: qty 1

## 2023-07-30 MED ORDER — HEPARIN BOLUS VIA INFUSION
4000.0000 [IU] | Freq: Once | INTRAVENOUS | Status: AC
  Administered 2023-07-30: 4000 [IU] via INTRAVENOUS
  Filled 2023-07-30: qty 4000

## 2023-07-30 MED ORDER — AZITHROMYCIN 250 MG PO TABS
250.0000 mg | ORAL_TABLET | ORAL | Status: DC
  Administered 2023-07-31: 250 mg via ORAL
  Filled 2023-07-30: qty 1

## 2023-07-30 MED ORDER — ALFUZOSIN HCL ER 10 MG PO TB24
10.0000 mg | ORAL_TABLET | Freq: Every day | ORAL | Status: DC
  Filled 2023-07-30: qty 1

## 2023-07-30 MED ORDER — AMLODIPINE BESYLATE 5 MG PO TABS
5.0000 mg | ORAL_TABLET | Freq: Every day | ORAL | Status: DC
  Administered 2023-07-30: 5 mg via ORAL
  Filled 2023-07-30: qty 1

## 2023-07-30 MED ORDER — SIMVASTATIN 20 MG PO TABS
20.0000 mg | ORAL_TABLET | Freq: Every day | ORAL | Status: DC
  Administered 2023-07-30 (×2): 20 mg via ORAL
  Filled 2023-07-30 (×2): qty 1

## 2023-07-30 MED ORDER — ONDANSETRON HCL 4 MG/2ML IJ SOLN: 4.0000 mg | Freq: Four times a day (QID) | INTRAMUSCULAR | Status: DC | PRN

## 2023-07-30 MED ORDER — ACETAMINOPHEN 325 MG PO TABS: 650.0000 mg | ORAL_TABLET | ORAL | Status: DC | PRN

## 2023-07-30 MED ORDER — HEPARIN (PORCINE) 25000 UT/250ML-% IV SOLN
1350.0000 [IU]/h | INTRAVENOUS | Status: DC
  Administered 2023-07-30: 1150 [IU]/h via INTRAVENOUS
  Administered 2023-07-31: 1350 [IU]/h via INTRAVENOUS
  Filled 2023-07-30 (×2): qty 250

## 2023-07-30 MED ORDER — PREDNISONE 10 MG PO TABS
5.0000 mg | ORAL_TABLET | Freq: Every day | ORAL | Status: DC
  Administered 2023-07-30 – 2023-07-31 (×2): 5 mg via ORAL
  Filled 2023-07-30 (×2): qty 1

## 2023-07-30 MED ORDER — PANTOPRAZOLE SODIUM 40 MG PO TBEC
40.0000 mg | DELAYED_RELEASE_TABLET | Freq: Every day | ORAL | Status: DC
  Administered 2023-07-30 – 2023-07-31 (×2): 40 mg via ORAL
  Filled 2023-07-30 (×2): qty 1

## 2023-07-30 MED ORDER — ASPIRIN 81 MG PO TBEC
81.0000 mg | DELAYED_RELEASE_TABLET | Freq: Every day | ORAL | Status: DC
  Administered 2023-07-31: 81 mg via ORAL
  Filled 2023-07-30: qty 1

## 2023-07-30 MED ORDER — DOXYCYCLINE MONOHYDRATE 100 MG PO TABS: 50.0000 mg | ORAL_TABLET | Freq: Two times a day (BID) | ORAL | Status: DC

## 2023-07-30 MED ORDER — CYCLOSPORINE 0.05 % OP EMUL
1.0000 [drp] | Freq: Two times a day (BID) | OPHTHALMIC | Status: DC
  Filled 2023-07-30 (×2): qty 30

## 2023-07-30 NOTE — Assessment & Plan Note (Addendum)
-   Hold home Eliquis  in light of heparin

## 2023-07-30 NOTE — ED Notes (Signed)
Cardiology in to see patient.

## 2023-07-30 NOTE — Assessment & Plan Note (Signed)
 Patient has established pulmonology, with started him on chronic prednisone and prophylactic antibiotics.  - Continue home regimen

## 2023-07-30 NOTE — Assessment & Plan Note (Signed)
 Patient has a history of HFpEF with last EF of 55-60% with grade 1 diastolic dysfunction.  He appears mildly hypervolemic at this time, however we will hold diuretics given AKI  - Daily weights - Strict ins and outs

## 2023-07-30 NOTE — Assessment & Plan Note (Signed)
-   Hold home valsartan  given AKI

## 2023-07-30 NOTE — Consult Note (Signed)
 Cardiology Consult    Patient ID: Jason Gates. MRN: [id number], DOB/AGE: 1940-09-10   Admit date: 07/30/2023 Date of Consult: 07/30/2023  Primary Physician: [name], PA-C Primary Cardiologist: Sammy Crisp, MD Requesting Provider: Tressa Fruits, MD  Patient Profile    Jason Gates. is a 83 y.o. male with a history of CAD, HTN, HL, diastolic dysfxn, PAF on eliquis , OSA on CPAP, GERD, bladder cancer, PSVT, stroke, and pulm nodule, who is being seen today for the evaluation of chest pain and mild troponin elevation at the request of Dr. Guss Legacy.  Past Medical History   Subjective  Past Medical History:  Diagnosis Date   Bladder cancer (HCC)    CAD (coronary artery disease)    a. 2006 s/p PCI of LAD and D1; b. 07/2009 Cath: Patent LAD stent, occluded D1->Med rx; c. 01/2013 Cath: LM nl, LAD 20p, patent stent, D1 70-80p ISR, D2 nl, LCX 10, RCA nl, EF 60-65%; d. 10/2021 MV: fixed apical/apical lateral defect->artifact though cannot r/o scar. No ischemia. EF >65%.   CKD (chronic kidney disease), stage III (HCC)    Diastolic dysfunction    a. 09/2022 Echo: EF 55-60%, no rwma, GrI DD, nl RV fxn, no significant valvular dzs.   GERD (gastroesophageal reflux disease)    Hyperlipidemia LDL goal <70    OSA (obstructive sleep apnea)    PAF (paroxysmal atrial fibrillation) (HCC)    a. 10/2022 Zio: predominantly sinus rhythm, avg 66 (39-122). 6% afib burden, longest 1h 80m @ 102. Freq PACs (22.4%), freq PVCs (5.1%). 12 runs NSVT up to 12 beats @ 167. No prolonged pauses. Triggered events = RSR, PACs, PVCs; b. CHA2DS2VASc = 6-->eliquis  2.5 bid (age/creat).   Primary hypertension    PSVT (paroxysmal supraventricular tachycardia) (HCC)    Right upper lobe pulmonary nodule    Stroke [facility])     Past Surgical History:  Procedure Laterality Date   CARDIAC SURGERY     CHOLECYSTECTOMY     COLONOSCOPY WITH PROPOFOL  N/A 04/21/2022   Procedure: COLONOSCOPY WITH PROPOFOL ;  Surgeon:  Shane Darling, MD;  Location: [facility] ENDOSCOPY;  Service: Endoscopy;  Laterality: N/A;   CORONARY ANGIOPLASTY     "two stents in 2007"   ESOPHAGOGASTRODUODENOSCOPY (EGD) WITH PROPOFOL  N/A 04/21/2022   Procedure: ESOPHAGOGASTRODUODENOSCOPY (EGD) WITH PROPOFOL ;  Surgeon: Shane Darling, MD;  Location: [facility] ENDOSCOPY;  Service: Endoscopy;  Laterality: N/A;   EYE SURGERY     NASAL SEPTUM SURGERY       Allergies  Allergies  Allergen Reactions   K Phos Mono-Sod Phos Di & Mono Diarrhea   Sulfur Dioxide Hives   Darunavir     Other Reaction(s): Urticaria, Eruption of skin, Urticaria, Eruption of skin   Elemental Sulfur Other (See Comments)    intolerance   Phosphorus Other (See Comments)    intolerance   Potassium Nausea And Vomiting and Other (See Comments)   Potassium Phosphate     Other reaction(s): Diarrhea   Amoxicillin Rash and Hives       History of Present Illness    83 y.o. male with a history of CAD, HTN, HL, diastolic dysfxn, PAF on eliquis , OSA on CPAP, GERD, bladder cancer, PSVT, stroke, and pulm nodule.  He previously underwent PCI and stenting of the LAD and D1 in 2006 in [place] .  He says that after an initial PCI, he required repeat intervention 3 days later, though details are unclear.  The D1 was subsequently noted to be occluded on  cath in 07/2009.  Most recent cath in 01/2013 showed stable anatomy w/ patent LAD stent, severe proximal Diag dzs, and otherwise nonobstructive dzs.  He was diagnosed w/ PAF in 2022 and has been anticoagulated w/ eliquis .  Stress testing in 10/2021 was low-risk w/o ischemia.  Zio monitoring in 10/2022, showed a 6% Afib burden as well as 22.4 % PAC and 5.1% PVC burdens.  He saw Dr. Marven Slimmer who recommended ongoing conservative mgmt in the setting of propensity for bradycardia that might be worsened by amiodarone therapy.  Mr. Morton was in his usual state of health until this morning, when he had just walked up a flight of stairs in his home,  and had sudden onset of retrosternal chest discomfort which she describes as somewhat sharp.  This radiated to his jaw and was associated with mild dyspnea.  He immediately called EMS and was advised to take aspirin , which he did with subsequent resolution of symptoms within a few minutes.  By the time EMS arrived, he believes he was pain-free, though he did receive nitroglycerin  following their arrival anyway.  [facility] was [facility] ED where Mr. Privott was afebrile and normotensive.  ECG showed Afib, 84, nonspecific T changes.  Lab work notable for acute on chronic stage III kidney dzs w/ BUN/Creat of 51/2.59 (up from 23/1.61 earlier this year).  WBC 12.5.  H/H nl @ 14.7/45.0.  HsTrop mildly elevated with flat trend@ 29  24.  CXR w/ mildly improved bibasilar airspace opacities compared to imaging dating back to 12/2022.  No new findings were identified.  He is currently chest pain-free.  His son is at his bedside.  In discussing his A-fib history, he denies any awareness of paroxysmal A-fib.  He is often bradycardic at baseline he denies any prior history of presyncope or syncope.  Inpatient Medications   Subjective    [START ON 07/31/2023] alfuzosin   10 mg Oral Q breakfast   amLODipine   5 mg Oral QHS   [START ON 07/31/2023] aspirin  EC  81 mg Oral Daily   [START ON 07/31/2023] azithromycin   250 mg Oral Once per day on Monday Wednesday Friday   cycloSPORINE  1 drop Both Eyes BID   doxycycline  50 mg Oral BID   predniSONE  5 mg Oral Daily   simvastatin   20 mg Oral q1800   valACYclovir   500 mg Oral QHS    Family History    Family History  Problem Relation Age of Onset   Obesity Mother    Diabetes Father    Heart attack Father    Heart disease Father    He indicated that his mother is deceased. He indicated that his father is deceased.   Social History    Social History   Socioeconomic History   Marital status: Widowed    Spouse name: Not on file   Number of children: Not on file   Years  of education: Not on file   Highest education level: Not on file  Occupational History   Not on file  Tobacco Use   Smoking status: Former    Current packs/day: 0.00    Average packs/day: 1 pack/day for 15.0 years (15.0 ttl pk-yrs)    Types: Cigarettes    Start date: 63    Quit date: 51    Years since quitting: 49.4   Smokeless tobacco: Not on file   Tobacco comments:    Quit smoking in 1976  Vaping Use   Vaping status: Never  Used  Substance and Sexual Activity   Alcohol  use: Not Currently    Comment: quit in 1976   Drug use: Never   Sexual activity: Not on file  Other Topics Concern   Not on file  Social History Narrative   Not on file   Social Drivers of Health   Financial Resource Strain: Low Risk  (04/17/2023)   Received from [facility]   Overall Financial Resource Strain (CARDIA)    Difficulty of Paying Living Expenses: Not hard at all  Food Insecurity: No Food Insecurity (04/17/2023)   Received from [facility]   Hunger Vital Sign    Worried About Running Out of Food in the Last Year: Never true    Ran Out of Food in the Last Year: Never true  Transportation Needs: No Transportation Needs (04/17/2023)   Received from [facility] - Transportation    In the past 12 months, has lack of transportation kept you from medical appointments or from getting medications?: No    Lack of Transportation (Non-Medical): No  Physical Activity: Not on file  Stress: Not on file  Social Connections: Moderately Integrated (04/13/2023)   Social Connection and Isolation Panel [NHANES]    Frequency of Communication with Friends and Family: More than three times a week    Frequency of Social Gatherings with Friends and Family: More than three times a week    Attends Religious Services: More than 4 times per year    Active Member of [employer] or Organizations: Yes    Attends Banker Meetings: More than 4 times  per year    Marital Status: Widowed  Intimate Partner Violence: Not At Risk (04/13/2023)   Humiliation, Afraid, Rape, and Kick questionnaire    Fear of Current or Ex-Partner: No    Emotionally Abused: No    Physically Abused: No    Sexually Abused: No     Review of Systems    General:  No chills, fever, night sweats or weight changes.  Cardiovascular:  +++ chest pain, +++ dyspnea on exertion, no edema, orthopnea, palpitations, paroxysmal nocturnal dyspnea. Dermatological: No rash, lesions/masses Respiratory: No cough, +++ dyspnea Urologic: No hematuria, dysuria Abdominal:   No nausea, vomiting, diarrhea, bright red blood per rectum, melena, or hematemesis Neurologic:  No visual changes, wkns, changes in mental status. All other systems reviewed and are otherwise negative except as noted above.     Objective   Physical Exam    Blood pressure 122/68, pulse (!) 54, temperature 97.7 F (36.5 C), temperature source Oral, resp. rate 19, height 5\' 8"  (1.727 m), weight 102.5 kg, SpO2 97%.  General: Pleasant, NAD Psych: Normal affect. Neuro: Alert and oriented X 3. Moves all extremities spontaneously. HEENT: Normal  Neck: Supple without bruits or JVD. Lungs:  Resp regular and unlabored, CTA. Heart: Irregular, no s3, s4, or murmurs. Abdomen: Soft, non-tender, non-distended, BS + x 4.  Extremities: No clubbing, cyanosis or edema. DP/PT2+, Radials 2+ and equal bilaterally.  Labs    Cardiac Enzymes Recent Labs  Lab 07/30/23 1053 07/30/23 1250  TROPONINIHS 29* 24*     BNP    Component Value Date/Time   BNP 89.5 11/25/2021 1700    Lab Results  Component Value Date   WBC 12.5 (H) 07/30/2023   HGB 14.7 07/30/2023   HCT 45.0 07/30/2023   MCV 105.1 (H) 07/30/2023   PLT 224 07/30/2023    Recent Labs  Lab 07/30/23  1053  NA 142  K 3.7  CL 106  CO2 27  BUN 51*  CREATININE 2.59*  CALCIUM 9.1  GLUCOSE 106*   Lab Results  Component Value Date   CHOL 137 01/31/2021    HDL 35 (L) 01/31/2021   LDLCALC 68 01/31/2021   TRIG 204 (H) 01/31/2021      Radiology Studies    DG Chest Port 1 View Result Date: 07/30/2023 CLINICAL DATA:  Chest pain with shortness of breath. Dizziness after receiving nitroglycerin . EXAM: PORTABLE CHEST 1 VIEW COMPARISON:  Radiographs 04/11/2023 and 01/21/2023.  CT 06/12/2023. FINDINGS: 1111 hours. Lordotic positioning with lower lung volumes. Allowing for this, the heart size and mediastinal contours are grossly stable with increased right paratracheal density attributed to vascular structures. Patchy airspace opacities at both lung bases appear mildly improved from previous radiographs and CT. No new airspace disease, pleural effusion or pneumothorax. No acute osseous findings. Telemetry leads overlie the chest. IMPRESSION: Mildly improved bibasilar airspace opacities, likely resolving pneumonia. No new findings identified. Electronically Signed   By: Elmon Hagedorn M.D.   On: 07/30/2023 11:27      ECG & Cardiac Imaging    Afib, 84, nonspecific T changes - personally reviewed.  Assessment & Plan    1.  Unstable angina/coronary artery disease: Status post prior stenting of the LAD and first diagonal in 2006 with cath in 2011 and 2014 showing severe diagonal disease with patent LAD stent and otherwise nonobstructive disease.  He had a nonischemic stress test in 2023.  He periodically has brief episodes of chest pain but had a more severe episode this morning after walking upstairs, radiating to his jaw and associated with dyspnea.  Symptoms resolved within about 15 minutes, after taking aspirin .  Upon arrival to the ED, he was noted to be in rate controlled atrial fibrillation, though he was asymptomatic at that point.  He did not think he was experiencing any tachycardia at home.  Initial troponin was 29 with follow-up troponin of 24.  He has been chest pain-free since arrival.  Hold Eliquis  and add aspirin .  Follow-up echo.  Provided that  troponin trend remains flat and echo shows normal LV function, would likely plan to pursue a Lexiscan  Myoview  in the a.m.  With CKD 3 and acute on chronic worsening of renal function with creatinine of 2.59 (he thinks baseline is 1.9), with prefer to avoid contrast.  Agree with aspirin , statin.  No beta-blocker in the setting of known baseline bradycardia with rate sometimes dropping into the high 40s.  Agree with holding ARB, torsemide, and spironolactone.  2.  Primary hypertension: Blood pressure currently stable.  Follow off of ARB, MRA, and loop diuretic.  3.  Hyperlipidemia: LDL of 68 in 2022 on simvastatin  therapy.  If not checked as an outpatient more recently, will need follow-up.  4.  Paroxysmal atrial fibrillation: 6% PAF burden noted on monitoring in 2023.  He is unaware of A-fib occurrences and was in rate controlled atrial fibrillation on arrival today.  He does not think that he was tachycardic at home.  He converted spontaneously to sinus bradycardia while here.  He is not on AV nodal blocking agent at home in the setting of baseline bradycardia and sinus rhythm.  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  Holding Eliquis  and adding heparin in the setting of #1.  5.  Obstructive sleep apnea: Uses CPAP at home.  6.  Acute  on chronic stage III kidney disease: He is followed by nephrology in [place].  He believes recent creatinine was 1.9 in the outpatient setting and notes that only recently has been taking torsemide and spironolactone.  Creatinine 2.59 here.  Diuretics and ARB on hold.  Follow.  7.  Recurrent pneumonia: Seen by nephrology as an outpatient.  Antibiotics and steroids per primary team.  Risk Assessment/Risk Scores:     TIMI Risk Score for Unstable Angina or Non-ST Elevation MI:   The patient's TIMI risk score is 4, which indicates a 20% risk of all cause mortality, new  or recurrent myocardial infarction or need for urgent revascularization in the next 14 days.    CHA2DS2-VASc Score = 6   This indicates a 9.7% annual risk of stroke. The patient's score is based upon: CHF History: 0 HTN History: 1 Diabetes History: 0 Stroke History: 2 Vascular Disease History: 1 Age Score: 2 Gender Score: 0     Signed, Laneta Pintos, NP 07/30/2023, 2:06 PM  For questions or updates, please contact   Please consult [email or web address] for contact info under Cardiology/STEMI.

## 2023-07-30 NOTE — ED Triage Notes (Signed)
 C/O severe chest pains this morning radiating up to jaw bilaterally. STates was up, doing things around the house.  C/O intermittent 'twinge' of chest pain currently.  AAOx3.  Skin warm and dry. NAD

## 2023-07-30 NOTE — Assessment & Plan Note (Addendum)
 Patient with known history of CAD, presenting with recurrent chest pain with radiation up to the jaw, occurring with shortness of breath and diaphoresis.  Troponin today's minimally elevated at 29, however historically his troponins have always been normal.  Overall, history is concerning for developing ACS.  - Cardiology consulted; appreciate their recommendations - Telemetry monitoring - Heparin infusion per pharmacy dosing - S/p aspirin  324 mg once - Continue aspirin  81 mg daily - Echocardiogram

## 2023-07-30 NOTE — H&P (Addendum)
 History and Physical    Patient: Jason Gates. ZOX:[id number] DOB: [birth year] DOA: 07/30/2023 DOS: the patient was seen and examined on 07/30/2023 PCP: [name], PA-C  Patient coming from: Home  Chief Complaint:  Chief Complaint  Patient presents with   Chest Pain   HPI: Jason Kadlec. is a 83 y.o. male with medical history significant of CAD s/p PCI with DES to LAD, HFpEF, CKD stage IIIa, OSA, atrial fibrillation on Eliquis , hypertension, CVA, bladder cancer on surveillance, who presents to the ED due to chest pain.  Jason Gates states that he was walking up the stairs and sat down in his recliner when he had sudden onset severe chest pain that radiated up into his jaw.  At the same time, he developed diaphoresis and shortness of breath.  Otherwise, he states he has been doing well.  His nephrologist has decreased his dose of diuretic due to muscle cramping.  He has not noticed any worsening lower extremity edema but has noticed that his abdominal girth has increased.  He attributed this to being started on prednisone for the last 2 months.  Patient's son at bedside states that this is not the first episode of chest pain but this seems to have been the worst.  ED course: On arrival to the ED, patient was normotensive at 138/81 with heart rate of 74.  He was saturating at 95% on room air.  He was afebrile at 97.7.  Initial workup notable for WBC of 12.5, BUN 51, creatinine 2.59, GFR 24.  Troponin mildly elevated at 29.  Chest x-ray with no new disease.  EKG with atrial fibrillation with slow ventricular rate.  Cardiology consulted and recommending admission.  TRH contacted.   Review of Systems: As mentioned in the history of present illness. All other systems reviewed and are negative.  Past Medical History:  Diagnosis Date   Bladder cancer (HCC)    CAD (coronary artery disease)    a. 2006 s/p PCI of LAD and D1; b. 07/2009 Cath: Patent LAD stent, occluded D1->Med rx; c.  01/2013 Cath: LM nl, LAD 20p, patent stent, D1 70-80p ISR, D2 nl, LCX 10, RCA nl, EF 60-65%; d. 10/2021 MV: fixed apical/apical lateral defect->artifact though cannot r/o scar. No ischemia. EF >65%.   CKD (chronic kidney disease), stage III (HCC)    Diastolic dysfunction    a. 09/2022 Echo: EF 55-60%, no rwma, GrI DD, nl RV fxn, no significant valvular dzs.   GERD (gastroesophageal reflux disease)    Hyperlipidemia LDL goal <70    OSA (obstructive sleep apnea)    PAF (paroxysmal atrial fibrillation) (HCC)    a. 10/2022 Zio: predominantly sinus rhythm, avg 66 (39-122). 6% afib burden, longest 1h 46m @ 102. Freq PACs (22.4%), freq PVCs (5.1%). 12 runs NSVT up to 12 beats @ 167. No prolonged pauses. Triggered events = RSR, PACs, PVCs; b. CHA2DS2VASc = 6-->eliquis  2.5 bid (age/creat).   Primary hypertension    PSVT (paroxysmal supraventricular tachycardia) (HCC)    Right upper lobe pulmonary nodule    Stroke [facility])    Past Surgical History:  Procedure Laterality Date   CARDIAC SURGERY     CHOLECYSTECTOMY     COLONOSCOPY WITH PROPOFOL  N/A 04/21/2022   Procedure: COLONOSCOPY WITH PROPOFOL ;  Surgeon: Shane Darling, MD;  Location: [facility] ENDOSCOPY;  Service: Endoscopy;  Laterality: N/A;   CORONARY ANGIOPLASTY     "two stents in 2007"   ESOPHAGOGASTRODUODENOSCOPY (EGD) WITH PROPOFOL  N/A 04/21/2022  Procedure: ESOPHAGOGASTRODUODENOSCOPY (EGD) WITH PROPOFOL ;  Surgeon: Shane Darling, MD;  Location: [facility] ENDOSCOPY;  Service: Endoscopy;  Laterality: N/A;   EYE SURGERY     NASAL SEPTUM SURGERY     Social History:  reports that he quit smoking about 49 years ago. His smoking use included cigarettes. He started smoking about 64 years ago. He has a 15 pack-year smoking history. He does not have any smokeless tobacco history on file. He reports that he does not currently use alcohol . He reports that he does not use drugs.  Allergies  Allergen Reactions   K Phos Mono-Sod Phos Di & Mono Diarrhea    Sulfur Dioxide Hives   Darunavir     Other Reaction(s): Urticaria, Eruption of skin, Urticaria, Eruption of skin   Elemental Sulfur Other (See Comments)    intolerance   Phosphorus Other (See Comments)    intolerance   Potassium Nausea And Vomiting and Other (See Comments)   Potassium Phosphate     Other reaction(s): Diarrhea   Amoxicillin Rash and Hives    Family History  Problem Relation Age of Onset   Obesity Mother    Diabetes Father    Heart attack Father    Heart disease Father     Prior to Admission medications   Medication Sig Start Date End Date Taking? Authorizing Provider  azithromycin  (ZITHROMAX ) 250 MG tablet Take 250 mg by mouth. MONDAY,WEDNESDAY AND FRIDAY 07/07/23  Yes [provider]  cycloSPORINE, PF, 0.09 % SOLN Apply 1 drop to eye 2 (two) times daily. 07/07/23  Yes [provider]  guaifenesin  (HUMIBID E) 400 MG TABS tablet Take 400 mg by mouth 4 (four) times daily as needed. 07/07/23  Yes [provider]  predniSONE (DELTASONE) 5 MG tablet Take 1 tablet by mouth daily. 07/07/23  Yes [provider]  spironolactone (ALDACTONE) 25 MG tablet Take 1 tablet by mouth daily. 07/07/23  Yes [provider]  torsemide (DEMADEX) 20 MG tablet Take 1 tablet by mouth daily. 07/07/23  Yes [provider]  alfuzosin  (UROXATRAL ) 10 MG 24 hr tablet Take 1 tablet (10 mg total) by mouth daily with breakfast. 06/10/23   Dustin Gimenez, MD  amLODipine  (NORVASC ) 5 MG tablet Take 5 mg by mouth at bedtime. 04/26/15   [provider]  augmented betamethasone dipropionate (DIPROLENE-AF) 0.05 % cream Apply topically 2 (two) times daily as needed. 02/09/23   [provider]  Cholecalciferol  (VITAMIN D ) 125 MCG (5000 UT) CAPS Take 5,000 Units by mouth daily.    [provider]  ELIQUIS  2.5 MG TABS tablet TAKE 1 TABLET TWICE A DAY 08/04/22   End, Veryl Gottron, MD  Nutritional Supplements (BLADDER 2.2 PO) Take 1 tablet by  mouth daily.    [provider]  pantoprazole  (PROTONIX ) 40 MG tablet Take 1 tablet (40 mg total) by mouth at bedtime. 01/23/22   End, Veryl Gottron, MD  sildenafil  (REVATIO ) 20 MG tablet Take 1 to 5 tablets daily as needed 1 hour prior to intercourse 09/24/22   Dustin Gimenez, MD  simvastatin  (ZOCOR ) 20 MG tablet Take 20 mg by mouth daily.    [provider]  valACYclovir  (VALTREX ) 500 MG tablet Take 500 mg by mouth at bedtime. 06/12/15   [provider]  valsartan  (DIOVAN ) 160 MG tablet Take 160 mg by mouth daily.    [provider]  VEVYE 0.1 % SOLN Apply to eye. 01/01/23   [provider]    Physical Exam: Vitals:   07/30/23  1048 07/30/23 1052 07/30/23 1230 07/30/23 1300  BP:  138/81 113/77 122/68  Pulse:  83 (!) 41 (!) 54  Resp:  16 20 19   Temp:  97.7 F (36.5 C)    TempSrc:  Oral    SpO2:  95% 99% 97%  Weight: 102.5 kg     Height: 5\' 8"  (1.727 m)      Physical Exam Vitals and nursing note reviewed.  Constitutional:      General: He is not in acute distress.    Appearance: He is obese. He is not toxic-appearing.  HENT:     Head: Normocephalic and atraumatic.  Eyes:     Extraocular Movements: Extraocular movements intact.     Pupils: Pupils are equal, round, and reactive to light.  Cardiovascular:     Rate and Rhythm: Bradycardia present. Rhythm irregular.     Heart sounds: No murmur heard. Pulmonary:     Effort: Pulmonary effort is normal. No tachypnea or respiratory distress.     Breath sounds: Normal breath sounds.  Abdominal:     General: Abdomen is protuberant.     Tenderness: There is no abdominal tenderness.  Musculoskeletal:     Right lower leg: 1+ Pitting Edema present.     Left lower leg: 1+ Pitting Edema present.  Skin:    General: Skin is warm and dry.  Neurological:     General: No focal deficit present.     Mental Status: He is alert and oriented to person, place, and time.  Psychiatric:        Mood and  Affect: Mood normal.        Behavior: Behavior normal.    Data Reviewed: CBC with WBC 12.5, hemoglobin 14.7, platelets of 224 BMP with sodium of 142, potassium 3.7, bicarb 27, glucose 106, BUN 51, creatinine 0.59, GFR of 24. Troponin 29 then 24  Initial EKG personally reviewed.  Atrial fibrillation with a rate of 84.  No acute ischemic changes.  Repeat EKG personally reviewed.  Atrial fibrillation with slow ventricular rate of 52.  DG Chest Port 1 View Result Date: 07/30/2023 CLINICAL DATA:  Chest pain with shortness of breath. Dizziness after receiving nitroglycerin . EXAM: PORTABLE CHEST 1 VIEW COMPARISON:  Radiographs 04/11/2023 and 01/21/2023.  CT 06/12/2023. FINDINGS: 1111 hours. Lordotic positioning with lower lung volumes. Allowing for this, the heart size and mediastinal contours are grossly stable with increased right paratracheal density attributed to vascular structures. Patchy airspace opacities at both lung bases appear mildly improved from previous radiographs and CT. No new airspace disease, pleural effusion or pneumothorax. No acute osseous findings. Telemetry leads overlie the chest. IMPRESSION: Mildly improved bibasilar airspace opacities, likely resolving pneumonia. No new findings identified. Electronically Signed   By: Elmon Hagedorn M.D.   On: 07/30/2023 11:27   Results are pending, will review when available.  Assessment and Plan:  * NSTEMI (non-ST elevated myocardial infarction) (HCC) Patient with known history of CAD, presenting with recurrent chest pain with radiation up to the jaw, occurring with shortness of breath and diaphoresis.  Troponin today's minimally elevated at 29, however historically his troponins have always been normal.  Overall, history is concerning for developing ACS.  - Cardiology consulted; appreciate their recommendations - Telemetry monitoring - Heparin infusion per pharmacy dosing - S/p aspirin  324 mg once - Continue aspirin  81 mg daily -  Echocardiogram  Acute kidney injury superimposed on chronic kidney disease (HCC) Unclear etiology of AKI at this time.  Between the time of his  last creatinine available in epic to now, however he has been started on spironolactone and torsemide, which may be playing a role.  - Hold off on IV fluids - Hold nephrotoxic agents - Bladder ultrasound - Recheck BMP in the a.m.  (HFpEF) heart failure with preserved ejection fraction (HCC) Patient has a history of HFpEF with last EF of 55-60% with grade 1 diastolic dysfunction.  He appears mildly hypervolemic at this time, however we will hold diuretics given AKI  - Daily weights - Strict ins and outs  Recurrent pneumonia Patient has established pulmonology, with started him on chronic prednisone and prophylactic antibiotics.  - Continue home regimen  Essential hypertension - Hold home valsartan  given AKI  Paroxysmal atrial fibrillation (HCC) - Hold home Eliquis  in light of heparin  Advance Care Planning:   Code Status: Full Code   Consults: Cards  Family Communication: Patient's son updated at bedside  Severity of Illness: The appropriate patient status for this patient is INPATIENT. Inpatient status is judged to be reasonable and necessary in order to provide the required intensity of service to ensure the patient's safety. The patient's presenting symptoms, physical exam findings, and initial radiographic and laboratory data in the context of their chronic comorbidities is felt to place them at high risk for further clinical deterioration. Furthermore, it is not anticipated that the patient will be medically stable for discharge from the hospital within 2 midnights of admission.   * I certify that at the point of admission it is my clinical judgment that the patient will require inpatient hospital care spanning beyond 2 midnights from the point of admission due to high intensity of service, high risk for further deterioration and high  frequency of surveillance required.*  Author: Avi Body, MD 07/30/2023 1:55 PM  For on call review [email or web address].

## 2023-07-30 NOTE — ED Notes (Signed)
Hospitalist in with patient.

## 2023-07-30 NOTE — Assessment & Plan Note (Signed)
 Unclear etiology of AKI at this time.  Between the time of his last creatinine available in epic to now, however he has been started on spironolactone and torsemide, which may be playing a role.  - Hold off on IV fluids - Hold nephrotoxic agents - Bladder ultrasound - Recheck BMP in the a.m.

## 2023-07-30 NOTE — ED Provider Notes (Signed)
 [facility] Provider Note    Event Date/Time   First MD Initiated Contact with Patient 07/30/23 1104     (approximate)   History   Chest Pain  First Nurse Note: Patient to ED via [facility] from home for CP/SOB. PT took 324 ASA PTA. C/o of some dizziness after receiving 1 nitroglycerin  from EMS.  18 R AC 80 HR 134/80  C/O severe chest pains this morning radiating up to jaw bilaterally. STates was up, doing things around the house.  C/O intermittent 'twinge' of chest pain currently.  AAOx3.  Skin warm and dry. NAD   HPI Jason Gates. is a 83 y.o. male PMH A-fib on Eliquis , hypertension, hyperlipidemia, CAD w/ PCI to LAD presents for evaluation of chest pain -915 this morning patient was walking around his house, developed sharp substernal chest pain and pressure with associated diaphoresis and radiation to bilateral jaws.  Called 911, took 324 mg aspirin , took nitroglycerin  on EMS arrival.  Symptoms lasted about 10 minutes. - Has had mild brief similar episodes recently though usually resolve very quickly.  Does note some recent worsening shortness of breath with exertion. - Currently asymptomatic - No abdominal pain, nausea - no recent cough, fever  Last myocardial perfusion scan in September 2023, felt to be low risk at that time.  Noted a small moderate fixed apical and apical lateral defect that was thought to likely be artifact or scar, no significant ischemia appreciated, LVEF normal.  Per chart review, last admitted in February of this year for hypoxemic respiratory failure secondary to pneumonia.     Physical Exam   Triage Vital Signs: ED Triage Vitals  Encounter Vitals Group     BP 07/30/23 1052 138/81     Systolic BP Percentile --      Diastolic BP Percentile --      Pulse Rate 07/30/23 1052 83     Resp 07/30/23 1052 16     Temp 07/30/23 1052 97.7 F (36.5 C)     Temp Source 07/30/23 1052 Oral     SpO2 07/30/23 1052 95 %      Weight 07/30/23 1048 226 lb (102.5 kg)     Height 07/30/23 1048 5\' 8"  (1.727 m)     Head Circumference --      Peak Flow --      Pain Score 07/30/23 1048 0     Pain Loc --      Pain Education --      Exclude from Growth Chart --     Most recent vital signs: Vitals:   07/30/23 1400 07/30/23 1532  BP: 126/70 127/61  Pulse: 60 (!) 50  Resp: 20 18  Temp: 98.4 F (36.9 C) 97.6 F (36.4 C)  SpO2: 98% 94%     General: Awake, no distress.  CV:  Good peripheral perfusion. RRR, RP 2+ Resp:  Normal effort. CTAB Abd:  No distention. Nontender to deep palpation throughout    ED Results / Procedures / Treatments   Labs (all labs ordered are listed, but only abnormal results are displayed) Labs Reviewed  BASIC METABOLIC PANEL WITH GFR - Abnormal; Notable for the following components:      Result Value   Glucose, Bld 106 (*)    BUN 51 (*)    Creatinine, Ser 2.59 (*)    GFR, Estimated 24 (*)    All other components within normal limits  CBC - Abnormal; Notable for the following components:  WBC 12.5 (*)    MCV 105.1 (*)    MCH 34.3 (*)    All other components within normal limits  TROPONIN I (HIGH SENSITIVITY) - Abnormal; Notable for the following components:   Troponin I (High Sensitivity) 29 (*)    All other components within normal limits  TROPONIN I (HIGH SENSITIVITY) - Abnormal; Notable for the following components:   Troponin I (High Sensitivity) 24 (*)    All other components within normal limits  PROTIME-INR  APTT  HEPARIN LEVEL (UNFRACTIONATED)  APTT     EKG  See ED course below.   RADIOLOGY Radiology interpreted by myself myself and radiology reports reviewed.  PROCEDURES:  Critical Care performed: No  Procedures   MEDICATIONS ORDERED IN ED: Medications  acetaminophen  (TYLENOL ) tablet 650 mg (has no administration in time range)  ondansetron  (ZOFRAN ) injection 4 mg (has no administration in time range)  aspirin  EC tablet 81 mg (has no  administration in time range)  alfuzosin  (UROXATRAL ) 24 hr tablet 10 mg (has no administration in time range)  amLODipine  (NORVASC ) tablet 5 mg (has no administration in time range)  azithromycin  (ZITHROMAX ) tablet 250 mg (has no administration in time range)  cycloSPORINE (RESTASIS) 0.05 % ophthalmic emulsion 1 drop (has no administration in time range)  valACYclovir  (VALTREX ) tablet 500 mg (has no administration in time range)  simvastatin  (ZOCOR ) tablet 20 mg (has no administration in time range)  predniSONE (DELTASONE) tablet 5 mg (5 mg Oral Given 07/30/23 1444)  heparin ADULT infusion 100 units/mL (25000 units/250mL) (1,150 Units/hr Intravenous New Bag/Given 07/30/23 1450)  heparin bolus via infusion 4,000 Units (4,000 Units Intravenous Bolus from Bag 07/30/23 1452)     IMPRESSION / MDM / ASSESSMENT AND PLAN / ED COURSE  I reviewed the triage vital signs and the nursing notes.                              DDX/MDM/AP: Differential diagnosis includes, but is not limited to, ACS, doubt PE given patient is already on anticoagulation, doubt aortic dissection or pneumothorax, doubt pneumonia.  Consider dyspepsia, transient arrhythmia.  Plan: - EKG - Cardiac monitor - Labs - Chest x-ray  Patient's presentation is most consistent with acute presentation with potential threat to life or bodily function.  The patient is on the cardiac monitor to evaluate for evidence of arrhythmia and/or significant heart rate changes.  ED course below.  Workup notable for mildly elevated troponin, otherwise unremarkable.  EKG nonischemic.  Concerned about patient's episode of chest pain and also has new AKI on CKD, consider possibly secondary to overdiuresis.  Admitted to hospitalist service after discussion with cardiology.  Clinical Course as of 07/30/23 1549  Thu Jul 30, 2023  1105 Ecg = A-fib, rate 84, no ST elevation or depression, no significant repolarization abnormality, normal axis, normal  intervals.  No clear evidence of ischemia on my interpretation. [MM]  1115 CBC with leukocytosis similar to prior (he is on prednisone), otherwise unremarkable [MM]  1139 CXR: IMPRESSION: Mildly improved bibasilar airspace opacities, likely resolving pneumonia. No new findings identified.   [MM]  1140 BMP with AKI on CKD, otherwise unremarkable [MM]  1217 Troponin elevated at 29, new from prior [MM]  1218 Cardiology paged to discuss further workup [MM]  1222 D/w Dr. Alvenia Aus of cardiology - agrees w/ admission - echo - will reconsider tomorrow whether stress test may be helpful.  Not good catheterization candidate given AKI and  CKD. [MM]  1225 Hospitalist consult order placed [MM]    Clinical Course User Index [MM] Collis Deaner, MD     FINAL CLINICAL IMPRESSION(S) / ED DIAGNOSES   Final diagnoses:  Chest pain, unspecified type  Elevated troponin  AKI (acute kidney injury) (HCC)     Rx / DC Orders   ED Discharge Orders     None        Note:  This document was prepared using Dragon voice recognition software and may include unintentional dictation errors.   Collis Deaner, MD 07/30/23 [phone]

## 2023-07-30 NOTE — ED Triage Notes (Signed)
 First Nurse Note: Patient to ED via [facility] from home for CP/SOB. PT took 324 ASA PTA. C/o of some dizziness after receiving 1 nitroglycerin  from EMS.  18 R AC 80 HR 134/80

## 2023-07-30 NOTE — Plan of Care (Signed)
  Problem: Education: Goal: Understanding of cardiac disease, CV risk reduction, and recovery process will improve 07/30/2023 1856 by Waneta Gut, RN Outcome: Progressing 07/30/2023 1856 by Waneta Gut, RN Outcome: Progressing Goal: Individualized Educational Video(s) 07/30/2023 1856 by Waneta Gut, RN Outcome: Progressing 07/30/2023 1856 by Waneta Gut, RN Outcome: Progressing   Problem: Activity: Goal: Ability to tolerate increased activity will improve 07/30/2023 1856 by Waneta Gut, RN Outcome: Progressing 07/30/2023 1856 by Waneta Gut, RN Outcome: Progressing   Problem: Cardiac: Goal: Ability to achieve and maintain adequate cardiovascular perfusion will improve 07/30/2023 1856 by Waneta Gut, RN Outcome: Progressing 07/30/2023 1856 by Waneta Gut, RN Outcome: Progressing   Problem: Health Behavior/Discharge Planning: Goal: Ability to safely manage health-related needs after discharge will improve Outcome: Progressing   Problem: Education: Goal: Knowledge of General Education information will improve Description: Including pain rating scale, medication(s)/side effects and non-pharmacologic comfort measures Outcome: Progressing   Problem: Health Behavior/Discharge Planning: Goal: Ability to manage health-related needs will improve Outcome: Progressing   Problem: Clinical Measurements: Goal: Ability to maintain clinical measurements within normal limits will improve Outcome: Progressing Goal: Will remain free from infection Outcome: Progressing Goal: Diagnostic test results will improve Outcome: Progressing Goal: Respiratory complications will improve Outcome: Progressing Goal: Cardiovascular complication will be avoided Outcome: Progressing   Problem: Activity: Goal: Risk for activity intolerance will decrease Outcome: Progressing   Problem: Nutrition: Goal: Adequate nutrition will be  maintained Outcome: Progressing   Problem: Coping: Goal: Level of anxiety will decrease Outcome: Progressing   Problem: Elimination: Goal: Will not experience complications related to bowel motility Outcome: Progressing Goal: Will not experience complications related to urinary retention Outcome: Progressing   Problem: Pain Managment: Goal: General experience of comfort will improve and/or be controlled Outcome: Progressing   Problem: Safety: Goal: Ability to remain free from injury will improve Outcome: Progressing   Problem: Skin Integrity: Goal: Risk for impaired skin integrity will decrease Outcome: Progressing

## 2023-07-31 ENCOUNTER — Inpatient Hospital Stay

## 2023-07-31 ENCOUNTER — Other Ambulatory Visit: Payer: Self-pay

## 2023-07-31 DIAGNOSIS — I214 Non-ST elevation (NSTEMI) myocardial infarction: Secondary | ICD-10-CM | POA: Diagnosis not present

## 2023-07-31 DIAGNOSIS — R079 Chest pain, unspecified: Secondary | ICD-10-CM

## 2023-07-31 DIAGNOSIS — N179 Acute kidney failure, unspecified: Secondary | ICD-10-CM | POA: Diagnosis not present

## 2023-07-31 DIAGNOSIS — I480 Paroxysmal atrial fibrillation: Secondary | ICD-10-CM | POA: Diagnosis not present

## 2023-07-31 DIAGNOSIS — R7989 Other specified abnormal findings of blood chemistry: Secondary | ICD-10-CM | POA: Diagnosis not present

## 2023-07-31 DIAGNOSIS — N189 Chronic kidney disease, unspecified: Secondary | ICD-10-CM | POA: Diagnosis not present

## 2023-07-31 LAB — CBC WITH DIFFERENTIAL/PLATELET
Abs Immature Granulocytes: 0.03 10*3/uL (ref 0.00–0.07)
Basophils Absolute: 0.1 10*3/uL (ref 0.0–0.1)
Basophils Relative: 1 %
Eosinophils Absolute: 0.2 10*3/uL (ref 0.0–0.5)
Eosinophils Relative: 1 %
HCT: 43 % (ref 39.0–52.0)
Hemoglobin: 14.2 g/dL (ref 13.0–17.0)
Immature Granulocytes: 0 %
Lymphocytes Relative: 35 %
Lymphs Abs: 3.9 10*3/uL (ref 0.7–4.0)
MCH: 34.7 pg — ABNORMAL HIGH (ref 26.0–34.0)
MCHC: 33 g/dL (ref 30.0–36.0)
MCV: 105.1 fL — ABNORMAL HIGH (ref 80.0–100.0)
Monocytes Absolute: 0.7 10*3/uL (ref 0.1–1.0)
Monocytes Relative: 6 %
Neutro Abs: 6.4 10*3/uL (ref 1.7–7.7)
Neutrophils Relative %: 57 %
Platelets: 214 10*3/uL (ref 150–400)
RBC: 4.09 MIL/uL — ABNORMAL LOW (ref 4.22–5.81)
RDW: 14.6 % (ref 11.5–15.5)
WBC: 11.1 10*3/uL — ABNORMAL HIGH (ref 4.0–10.5)
nRBC: 0 % (ref 0.0–0.2)

## 2023-07-31 LAB — NM MYOCAR MULTI W/SPECT W/WALL MOTION / EF
Estimated workload: 1
Exercise duration (min): 0 min
Exercise duration (sec): 0 s
LV dias vol: 94 mL (ref 62–150)
LV sys vol: 37 mL
MPHR: 136 {beats}/min
Nuc Stress EF: 57 %
Peak HR: 104 {beats}/min
Percent HR: 76 %
Rest HR: 60 {beats}/min
Rest Nuclear Isotope Dose: 10.6 mCi
SDS: 0
SRS: 5
SSS: 0
ST Depression (mm): 0 mm
Stress Nuclear Isotope Dose: 32.8 mCi
TID: 0.85

## 2023-07-31 LAB — APTT
aPTT: 36 s (ref 24–36)
aPTT: 158 s — ABNORMAL HIGH (ref 24–36)

## 2023-07-31 LAB — BASIC METABOLIC PANEL WITH GFR
Anion gap: 7 (ref 5–15)
BUN: 41 mg/dL — ABNORMAL HIGH (ref 8–23)
CO2: 26 mmol/L (ref 22–32)
Calcium: 8.8 mg/dL — ABNORMAL LOW (ref 8.9–10.3)
Chloride: 109 mmol/L (ref 98–111)
Creatinine, Ser: 1.9 mg/dL — ABNORMAL HIGH (ref 0.61–1.24)
GFR, Estimated: 34 mL/min — ABNORMAL LOW (ref >60)
Glucose, Bld: 94 mg/dL (ref 70–99)
Potassium: 3.9 mmol/L (ref 3.5–5.1)
Sodium: 142 mmol/L (ref 135–145)

## 2023-07-31 LAB — HEPARIN LEVEL (UNFRACTIONATED): Heparin Unfractionated: >1.1 [IU]/mL — ABNORMAL HIGH (ref 0.30–0.70)

## 2023-07-31 MED ORDER — HEPARIN (PORCINE) 25000 UT/250ML-% IV SOLN: 1200.0000 [IU]/h | INTRAVENOUS | Status: DC

## 2023-07-31 MED ORDER — TECHNETIUM TC 99M TETROFOSMIN IV KIT
32.8300 | PACK | Freq: Once | INTRAVENOUS | Status: AC | PRN
Start: 2023-07-31 — End: 2023-07-31
  Administered 2023-07-31: 32.83 via INTRAVENOUS

## 2023-07-31 MED ORDER — NITROGLYCERIN 0.4 MG SL SUBL
0.4000 mg | SUBLINGUAL_TABLET | SUBLINGUAL | 3 refills | Status: AC | PRN
  Filled 2023-07-31: qty 25, 8d supply, fill #0

## 2023-07-31 MED ORDER — TECHNETIUM TC 99M TETROFOSMIN IV KIT
10.5900 | PACK | Freq: Once | INTRAVENOUS | Status: AC | PRN
  Administered 2023-07-31: 10.59 via INTRAVENOUS

## 2023-07-31 MED ORDER — HEPARIN BOLUS VIA INFUSION
2700.0000 [IU] | Freq: Once | INTRAVENOUS | Status: AC
  Administered 2023-07-31: 2700 [IU] via INTRAVENOUS
  Filled 2023-07-31: qty 2700

## 2023-07-31 MED ORDER — REGADENOSON 0.4 MG/5ML IV SOLN
0.4000 mg | Freq: Once | INTRAVENOUS | Status: AC
  Administered 2023-07-31: 0.4 mg via INTRAVENOUS

## 2023-07-31 NOTE — Care Management CC44 (Signed)
 Condition Code 44 Documentation Completed  Patient Details  Name: Jason Gates. MRN: [id number] Date of Birth: 04-Sep-1940   Condition Code 44 given:  Yes Patient signature on Condition Code 44 notice:  Yes Documentation of 2 MD's agreement:  Yes Code 44 added to claim:  Yes    Odilia Bennett, LCSW 07/31/2023, 3:59 PM

## 2023-07-31 NOTE — Care Management Obs Status (Signed)
 MEDICARE OBSERVATION STATUS NOTIFICATION   Patient Details  Name: Jason Gates. MRN: [id number] Date of Birth: 1941/02/10   Medicare Observation Status Notification Given:  Yes    Odilia Bennett, LCSW 07/31/2023, 3:59 PM

## 2023-07-31 NOTE — TOC CM/SW Note (Signed)
 Transition of Care [facility]) - Inpatient Brief Assessment   Patient Details  Name: Jason Gates. MRN: [id number] Date of Birth: [birth year]  [facility] CM/SW Contact:    Odilia Bennett, LCSW Phone Number: 07/31/2023, 2:39 PM   Clinical Narrative: CSW reviewed chart. No TOC needs identified so far. CSW will continue to follow progress. Please place [facility] consult if any needs arise.  Transition of Care Asessment: Insurance and Status: Insurance coverage has been reviewed Patient has primary care physician: Yes Home environment has been reviewed: Single family home Prior level of function:: Not documented Prior/Current Home Services: No current home services Social Drivers of Health Review: SDOH reviewed no interventions necessary Readmission risk has been reviewed: Yes Transition of care needs: no transition of care needs at this time

## 2023-07-31 NOTE — Progress Notes (Signed)
 Cardiology Progress Note   Patient Name: Jason Gates. Date of Encounter: 07/31/2023  Primary Cardiologist: Sammy Crisp, MD  Subjective   Had some upper back pain prior to a BM this AM, otherwise has felt well.  No c/p or dyspnea.  In and out of rate-controlled afib - asymptomatic. Objective   Inpatient Medications    Scheduled Meds:  alfuzosin   10 mg Oral Q breakfast   amLODipine   5 mg Oral QHS   aspirin  EC  81 mg Oral Daily   azithromycin   250 mg Oral Once per day on Monday Wednesday Friday   cycloSPORINE  1 drop Both Eyes BID   pantoprazole   40 mg Oral Daily   predniSONE  5 mg Oral Daily   simvastatin   20 mg Oral q1800   valACYclovir   500 mg Oral QHS   Continuous Infusions:  heparin 1,350 Units/hr (07/31/23 0636)   PRN Meds: acetaminophen , ondansetron  (ZOFRAN ) IV   Vital Signs    Vitals:   07/30/23 2100 07/30/23 2317 07/31/23 0427 07/31/23 0830  BP: 138/65 (!) 149/66 132/64 (!) 141/83  Pulse: 68 (!) 50 (!) 50 79  Resp:  19 18   Temp: 98.1 F (36.7 C) 97.8 F (36.6 C)  (!) 97.5 F (36.4 C)  TempSrc: Oral Oral    SpO2: 98% 95% 95% 95%  Weight:      Height:        Intake/Output Summary (Last 24 hours) at 07/31/2023 0949 Last data filed at 07/31/2023 0326 Gross per 24 hour  Intake 446.94 ml  Output --  Net 446.94 ml   Filed Weights   07/30/23 1048  Weight: 102.5 kg    Physical Exam   GEN: Well nourished, well developed, in no acute distress.  HEENT: Grossly normal.  Neck: Supple, no JVD, carotid bruits, or masses. Cardiac: RRR, brady, no murmurs, rubs, or gallops. No clubbing, cyanosis, edema.  Radials 2+, DP/PT 2+ and equal bilaterally.  Respiratory:  Respirations regular and unlabored, clear to auscultation bilaterally. GI: Soft, nontender, nondistended, BS + x 4. MS: no deformity or atrophy. Skin: warm and dry, no rash. Neuro:  Strength and sensation are intact. Psych: AAOx3.  Normal affect.  Labs    Chemistry Recent Labs  Lab  07/30/23 1053 07/31/23 0524  NA 142 142  K 3.7 3.9  CL 106 109  CO2 27 26  GLUCOSE 106* 94  BUN 51* 41*  CREATININE 2.59* 1.90*  CALCIUM 9.1 8.8*  GFRNONAA 24* 34*  ANIONGAP 9 7     Hematology Recent Labs  Lab 07/30/23 1053 07/31/23 0524  WBC 12.5* 11.1*  RBC 4.28 4.09*  HGB 14.7 14.2  HCT 45.0 43.0  MCV 105.1* 105.1*  MCH 34.3* 34.7*  MCHC 32.7 33.0  RDW 14.8 14.6  PLT 224 214    Cardiac Enzymes  Recent Labs  Lab 07/30/23 1053 07/30/23 1250  TROPONINIHS 29* 24*      BNP    Component Value Date/Time   BNP 89.5 11/25/2021 1700   Lipids  Lab Results  Component Value Date   CHOL 137 01/31/2021   HDL 35 (L) 01/31/2021   LDLCALC 68 01/31/2021   TRIG 204 (H) 01/31/2021   CHOLHDL 3.9 01/31/2021    Radiology    DG Chest Port 1 View Result Date: 07/30/2023 CLINICAL DATA:  Chest pain with shortness of breath. Dizziness after receiving nitroglycerin . EXAM: PORTABLE CHEST 1 VIEW COMPARISON:  Radiographs 04/11/2023 and 01/21/2023.  CT 06/12/2023. FINDINGS: 1111 hours.  Lordotic positioning with lower lung volumes. Allowing for this, the heart size and mediastinal contours are grossly stable with increased right paratracheal density attributed to vascular structures. Patchy airspace opacities at both lung bases appear mildly improved from previous radiographs and CT. No new airspace disease, pleural effusion or pneumothorax. No acute osseous findings. Telemetry leads overlie the chest. IMPRESSION: Mildly improved bibasilar airspace opacities, likely resolving pneumonia. No new findings identified. Electronically Signed   By: Elmon Hagedorn M.D.   On: 07/30/2023 11:27     Telemetry    Sinus brady in 50's to Afib in 80's w/ peak AF rate of 110 - Personally Reviewed  Cardiac Studies   Lexiscan  Myoview  & echo pending  Patient Profile     83 y.o. male with a history of CAD, HTN, HL, diastolic dysfxn, PAF on eliquis , CKD III-IV, OSA on CPAP, GERD, bladder cancer,  PSVT, stroke, and pulm nodule, who was admitted 6/5 following episode of c/p.  In asymptomatic AFib on arrival, PAF since.  Assessment & Plan    1.  Unstable Angina/CAD:  Status post prior stenting of the LAD and first diagonal in 2006 with cath in 2011 and 2014 showing severe diagonal disease with patent LAD stent and otherwise nonobstructive disease. He had a nonischemic stress test in 2023. Admitted 6/5 following c/p episode w/ radiation to jaw. Ss resolved w/in 15 mins of taking ASA.  Was in rate-controlled Afib in ED, but was asymp and converted to sinus brady in ED.  PAF since.  HsTrop 29  24.  No c/p overnight.  Plan for lexiscan  MV today.  No ? blocker in setting of baseline brady.  Questions answered.  2.  Primary HTN:  BP trending higher.  Home doses of ARB, MRA, and loop diuretic on hold in setting of AKI.  On further questioning, he says that his nephrologist had previously stopped his valsartan  and that he wasn't taking @ home.  Cont amlodipine .  If renal fxn stable, will look to resume MRA.  Appears to be very sensitive to torsemide.  3.  HL:  LDL 68 in 2022.  F/u.  Cont statin.  4.  PAF:  6% burden on monitoring in 2023.  As above, in rate-controlled AFib on admission and converted to sinus brady in ED.  Has cont to have paroxysms of Afib, rates typically in the 70's  to 80's w/ brief run up to 110.  Eliquis  currently on hold/on heparin in setting of c/p.  5.  OSA:  uses CPAP @ home.  5.  Acute on chronic CKD III:  Followed by nephrology in [place].  Creat 2.59 on admission in setting of recent additions of spiro and torsemide per pt.  Both on hold. Creat back to baseline this AM @ 1.9.  6.  Recurrent PNA:  followed by pulm as outpt.  Meds per IM.  Signed, Laneta Pintos, NP  07/31/2023, 9:49 AM    For questions or updates, please contact   Please consult [email or web address] for contact info under Cardiology/STEMI.

## 2023-07-31 NOTE — Progress Notes (Signed)
 PHARMACY - ANTICOAGULATION CONSULT NOTE  Pharmacy Consult for  Heparin  Indication: chest pain/ACS  Allergies  Allergen Reactions   K Phos Mono-Sod Phos Di & Mono Diarrhea   Sulfur Dioxide Hives   Darunavir     Other Reaction(s): Urticaria, Eruption of skin, Urticaria, Eruption of skin   Elemental Sulfur Other (See Comments)    intolerance   Phosphorus Other (See Comments)    intolerance   Potassium Nausea And Vomiting and Other (See Comments)   Potassium Phosphate     Other reaction(s): Diarrhea   Amoxicillin Rash and Hives    Patient Measurements: Height: 5\' 8"  (172.7 cm) Weight: 102.5 kg (226 lb) IBW/kg (Calculated) : 68.4 HEPARIN DW (KG): 90.6  Vital Signs: Temp: 97.8 F (36.6 C) (06/05 2317) Temp Source: Oral (06/05 2317) BP: 149/66 (06/05 2317) Pulse Rate: 50 (06/05 2317)  Labs: Recent Labs    07/30/23 1053 07/30/23 1250 07/30/23 1720 07/30/23 2329  HGB 14.7  --   --   --   HCT 45.0  --   --   --   PLT 224  --   --   --   APTT 25  --  89* 36  LABPROT 13.5  --   --   --   INR 1.0  --   --   --   HEPARINUNFRC  --   --  >1.10*  --   CREATININE 2.59*  --   --   --   TROPONINIHS 29* 24*  --   --     Estimated Creatinine Clearance: 24.6 mL/min (A) (by C-G formula based on SCr of 2.59 mg/dL (H)).   Medical History: Past Medical History:  Diagnosis Date   Bladder cancer (HCC)    CAD (coronary artery disease)    a. 2006 s/p PCI of LAD and D1; b. 07/2009 Cath: Patent LAD stent, occluded D1->Med rx; c. 01/2013 Cath: LM nl, LAD 20p, patent stent, D1 70-80p ISR, D2 nl, LCX 10, RCA nl, EF 60-65%; d. 10/2021 MV: fixed apical/apical lateral defect->artifact though cannot r/o scar. No ischemia. EF >65%.   CKD (chronic kidney disease), stage III (HCC)    Diastolic dysfunction    a. 09/2022 Echo: EF 55-60%, no rwma, GrI DD, nl RV fxn, no significant valvular dzs.   GERD (gastroesophageal reflux disease)    Hyperlipidemia LDL goal <70    OSA (obstructive sleep apnea)     PAF (paroxysmal atrial fibrillation) (HCC)    a. 10/2022 Zio: predominantly sinus rhythm, avg 66 (39-122). 6% afib burden, longest 1h 108m @ 102. Freq PACs (22.4%), freq PVCs (5.1%). 12 runs NSVT up to 12 beats @ 167. No prolonged pauses. Triggered events = RSR, PACs, PVCs; b. CHA2DS2VASc = 6-->eliquis  2.5 bid (age/creat).   Primary hypertension    PSVT (paroxysmal supraventricular tachycardia) (HCC)    Right upper lobe pulmonary nodule    Stroke [facility])     Medications:  Medications Prior to Admission  Medication Sig Dispense Refill Last Dose/Taking   alfuzosin  (UROXATRAL ) 10 MG 24 hr tablet Take 1 tablet (10 mg total) by mouth daily with breakfast. 30 tablet 6 07/29/2023 Morning   amLODipine  (NORVASC ) 5 MG tablet Take 5 mg by mouth at bedtime.   07/29/2023 Evening   aspirin  EC 81 MG tablet Take 81 mg by mouth daily. Swallow whole.   07/29/2023 Morning   augmented betamethasone dipropionate (DIPROLENE-AF) 0.05 % cream Apply topically 2 (two) times daily as needed.   Taking As Needed  azithromycin  (ZITHROMAX ) 250 MG tablet Take 250 mg by mouth. MONDAY,WEDNESDAY AND FRIDAY   07/29/2023 Evening   cycloSPORINE, PF, 0.09 % SOLN Apply 1 drop to eye 2 (two) times daily.   07/29/2023 Morning   doxycycline (ADOXA) 50 MG tablet Take 50 mg by mouth 2 (two) times daily.   07/29/2023 Morning   ELIQUIS  2.5 MG TABS tablet TAKE 1 TABLET TWICE A DAY 180 tablet 3 07/29/2023 at  8:00 PM   guaifenesin  (HUMIBID E) 400 MG TABS tablet Take 400 mg by mouth 4 (four) times daily as needed.   Unknown   Nutritional Supplements (BLADDER 2.2 PO) Take 1 tablet by mouth daily.   07/29/2023 Morning   pantoprazole  (PROTONIX ) 40 MG tablet Take 1 tablet (40 mg total) by mouth at bedtime. 90 tablet 3 07/29/2023 Evening   predniSONE (DELTASONE) 5 MG tablet Take 1 tablet by mouth daily.   07/29/2023 Morning   sildenafil  (REVATIO ) 20 MG tablet Take 1 to 5 tablets daily as needed 1 hour prior to intercourse 30 tablet 11 Past Month   simvastatin   (ZOCOR ) 20 MG tablet Take 20 mg by mouth daily.   07/29/2023 Morning   spironolactone (ALDACTONE) 25 MG tablet Take 1 tablet by mouth daily.   07/29/2023 Morning   torsemide (DEMADEX) 20 MG tablet Take 1 tablet by mouth daily.   Past Week   valACYclovir  (VALTREX ) 500 MG tablet Take 500 mg by mouth at bedtime.   07/29/2023 Morning   valsartan  (DIOVAN ) 160 MG tablet Take 160 mg by mouth daily.   07/29/2023 Morning   VEVYE 0.1 % SOLN Apply to eye.   07/29/2023 Morning   Cholecalciferol  (VITAMIN D ) 125 MCG (5000 UT) CAPS Take 5,000 Units by mouth daily.       Assessment: Pharmacy consulted to dose heparin in this 83 year old male admitted with ACS/NSTEMI.  Pt was on Eliquis  2.5 mg PO BID PTA, last dose on 3/4 @ 2000.    CrCl = 24.6 ml/min   Goal of Therapy:  Heparin level 0.3-0.7 units/ml aPTT 66 - 102 seconds Monitor platelets by anticoagulation protocol: Yes   Plan:  6/5:  aPTT @ 2329 = 36, SUBtherapeutic - Will order heparin 2700 units IV X 1 bolus and increase drip rate to 1350 units/hr - Will recheck aPTT and HL 8 hrs after rate change - Will use aPTT to guide dosing until correlating with HL - CBC daily   Rakiyah Esch D 07/31/2023,12:30 AM

## 2023-07-31 NOTE — Progress Notes (Signed)
 PHARMACY - ANTICOAGULATION CONSULT NOTE  Pharmacy Consult for  Heparin  Indication: chest pain/ACS  Allergies  Allergen Reactions   K Phos Mono-Sod Phos Di & Mono Diarrhea   Sulfur Dioxide Hives   Darunavir     Other Reaction(s): Urticaria, Eruption of skin, Urticaria, Eruption of skin   Elemental Sulfur Other (See Comments)    intolerance   Phosphorus Other (See Comments)    intolerance   Potassium Nausea And Vomiting and Other (See Comments)   Potassium Phosphate     Other reaction(s): Diarrhea   Amoxicillin Rash and Hives    Patient Measurements: Height: 5\' 8"  (172.7 cm) Weight: 102.5 kg (226 lb) IBW/kg (Calculated) : 68.4 HEPARIN DW (KG): 90.6  Vital Signs: Temp: 97.5 F (36.4 C) (06/06 0830) Temp Source: Oral (06/05 2317) BP: 141/83 (06/06 0830) Pulse Rate: 79 (06/06 0830)  Labs: Recent Labs    07/30/23 1053 07/30/23 1250 07/30/23 1720 07/30/23 2329 07/31/23 0524 07/31/23 0922  HGB 14.7  --   --   --  14.2  --   HCT 45.0  --   --   --  43.0  --   PLT 224  --   --   --  214  --   APTT 25  --  89* 36  --  158*  LABPROT 13.5  --   --   --   --   --   INR 1.0  --   --   --   --   --   HEPARINUNFRC  --   --  >1.10*  --   --  >1.10*  CREATININE 2.59*  --   --   --  1.90*  --   TROPONINIHS 29* 24*  --   --   --   --     Estimated Creatinine Clearance: 33.6 mL/min (A) (by C-G formula based on SCr of 1.9 mg/dL (H)).   Medical History: Past Medical History:  Diagnosis Date   Bladder cancer (HCC)    CAD (coronary artery disease)    a. 2006 s/p PCI of LAD and D1; b. 07/2009 Cath: Patent LAD stent, occluded D1->Med rx; c. 01/2013 Cath: LM nl, LAD 20p, patent stent, D1 70-80p ISR, D2 nl, LCX 10, RCA nl, EF 60-65%; d. 10/2021 MV: fixed apical/apical lateral defect->artifact though cannot r/o scar. No ischemia. EF >65%.   CKD (chronic kidney disease), stage III (HCC)    Diastolic dysfunction    a. 09/2022 Echo: EF 55-60%, no rwma, GrI DD, nl RV fxn, no significant  valvular dzs.   GERD (gastroesophageal reflux disease)    Hyperlipidemia LDL goal <70    OSA (obstructive sleep apnea)    PAF (paroxysmal atrial fibrillation) (HCC)    a. 10/2022 Zio: predominantly sinus rhythm, avg 66 (39-122). 6% afib burden, longest 1h 78m @ 102. Freq PACs (22.4%), freq PVCs (5.1%). 12 runs NSVT up to 12 beats @ 167. No prolonged pauses. Triggered events = RSR, PACs, PVCs; b. CHA2DS2VASc = 6-->eliquis  2.5 bid (age/creat).   Primary hypertension    PSVT (paroxysmal supraventricular tachycardia) (HCC)    Right upper lobe pulmonary nodule    Stroke [facility])     Medications:  Medications Prior to Admission  Medication Sig Dispense Refill Last Dose/Taking   alfuzosin  (UROXATRAL ) 10 MG 24 hr tablet Take 1 tablet (10 mg total) by mouth daily with breakfast. 30 tablet 6 07/29/2023 Morning   amLODipine  (NORVASC ) 5 MG tablet Take 5 mg by mouth at bedtime.  07/29/2023 Evening   aspirin  EC 81 MG tablet Take 81 mg by mouth daily. Swallow whole.   07/29/2023 Morning   augmented betamethasone dipropionate (DIPROLENE-AF) 0.05 % cream Apply topically 2 (two) times daily as needed.   Taking As Needed   azithromycin  (ZITHROMAX ) 250 MG tablet Take 250 mg by mouth. MONDAY,WEDNESDAY AND FRIDAY   07/29/2023 Evening   cycloSPORINE, PF, 0.09 % SOLN Apply 1 drop to eye 2 (two) times daily.   07/29/2023 Morning   doxycycline (ADOXA) 50 MG tablet Take 50 mg by mouth 2 (two) times daily.   07/29/2023 Morning   ELIQUIS  2.5 MG TABS tablet TAKE 1 TABLET TWICE A DAY 180 tablet 3 07/29/2023 at  8:00 PM   guaifenesin  (HUMIBID E) 400 MG TABS tablet Take 400 mg by mouth 4 (four) times daily as needed.   Unknown   Nutritional Supplements (BLADDER 2.2 PO) Take 1 tablet by mouth daily.   07/29/2023 Morning   pantoprazole  (PROTONIX ) 40 MG tablet Take 1 tablet (40 mg total) by mouth at bedtime. 90 tablet 3 07/29/2023 Evening   predniSONE (DELTASONE) 5 MG tablet Take 1 tablet by mouth daily.   07/29/2023 Morning   sildenafil  (REVATIO )  20 MG tablet Take 1 to 5 tablets daily as needed 1 hour prior to intercourse 30 tablet 11 Past Month   simvastatin  (ZOCOR ) 20 MG tablet Take 20 mg by mouth daily.   07/29/2023 Morning   spironolactone (ALDACTONE) 25 MG tablet Take 1 tablet by mouth daily.   07/29/2023 Morning   torsemide (DEMADEX) 20 MG tablet Take 1 tablet by mouth daily.   Past Week   valACYclovir  (VALTREX ) 500 MG tablet Take 500 mg by mouth at bedtime.   07/29/2023 Morning   valsartan  (DIOVAN ) 160 MG tablet Take 160 mg by mouth daily.   07/29/2023 Morning   VEVYE 0.1 % SOLN Apply to eye.   07/29/2023 Morning   Cholecalciferol  (VITAMIN D ) 125 MCG (5000 UT) CAPS Take 5,000 Units by mouth daily.       Assessment:  83 year old male admitted with ACS/NSTEMI. PMH includes CAD, HTN, HL, diastolic dysfxn, PAF on eliquis , OSA on CPAP, GERD, bladder cancer, PSVT, stroke, and pulm nodule. Pharmacy consulted to start and manage heparin infusion.   Pt was on Eliquis  2.5 mg PO BID prior to admission, last dose on 3/4 @ 2000. CrCl = 24.6 ml/min.   Date/time Results Heparin rate  6/5@2329  aPTT=36 1150 units/hours  6/6@0922  aPTT=158 1350 units/hours           Goal of Therapy:  Heparin level 0.3-0.7 units/ml aPTT 66 - 102 seconds Monitor platelets by anticoagulation protocol: Yes   Plan:  HOLD heparin infusion x 1hr , then resume at 1200 units/hour Check aPTT 8 hrs after heparin re-start Will use aPTT to guide dosing until correlating with HL CBC daily    Abass Misener Rodriguez-Guzman PharmD, BCPS 07/31/2023 10:59 AM

## 2023-07-31 NOTE — Plan of Care (Signed)
  Problem: Education: Goal: Understanding of cardiac disease, CV risk reduction, and recovery process will improve Outcome: Progressing   Problem: Education: Goal: Individualized Educational Video(s) Outcome: Progressing   Problem: Activity: Goal: Ability to tolerate increased activity will improve Outcome: Progressing

## 2023-08-02 NOTE — Discharge Summary (Signed)
 Physician Discharge Summary   Patient: Jason Gates. MRN: [id number] DOB: [birth year]  Admit date:     07/30/2023  Discharge date: 07/31/2023  Discharge Physician: Brenna Cam   PCP: [name], PA-C   Recommendations at discharge:    F/up with outpt providers as requested  Discharge Diagnoses: Principal Problem:   NSTEMI (non-ST elevated myocardial infarction) [facility]) Active Problems:   AKI (acute kidney injury) (HCC)   Paroxysmal atrial fibrillation (HCC)   Essential hypertension   Recurrent pneumonia   (HFpEF) heart failure with preserved ejection fraction (HCC)   Chest pain   Elevated troponin  Hospital Course: Assessment and Plan:  83 y.o. male with medical history significant of CAD s/p PCI with DES to LAD, HFpEF, CKD stage IIIa, OSA, atrial fibrillation on Eliquis , hypertension, CVA, bladder cancer on surveillance, who presents to the ED due to chest pain.   Elevated troponins due to demand ischemia, no MI Unstable Angina Patient with known history of CAD, presenting with recurrent chest pain with radiation up to the jaw, occurring with shortness of breath and diaphoresis.   - s/p Myocardial perfusion stress test which was low risk with apical and basal inferior fixed defects most suspicious for artifact, though apical scar could not be entirely excluded.  No significant ischemia was identified.  Overall, study appeared similar to last stress test from 10/2021 per cardio. - He likely had paroxysmal atrial fibrillation with rapid ventricular response leading to his chest discomfort and is resolved. Cath deferred for now. - They would like to see EP Dr. Marven Slimmer (EP) first to decide if he needs Amiodarone vs alternative treatment -  He is transitioned from heparin  to apixaban  2.5 mg twice daily in the setting of his age and renal insufficiency.   - hold spironolactone in the setting of his resolving AKI.  Continue torsemide 20 mg daily for now.  He will need a BMP in about  a week,  - outpt Cardio f/up with Dr End   Acute kidney injury superimposed on chronic kidney disease 3a(HCC) Improved with hydration and close to baseline   (HFpEF) heart failure with preserved ejection fraction (HCC) Patient has a history of HFpEF with last EF of 55-60% with grade 1 diastolic dysfunction Well compensated at this time   Recurrent pneumonia Patient has established pulmonology, with started him on chronic prednisone  and prophylactic antibiotics. - outpt pulmo f/up   Essential hypertension Paroxysmal atrial fibrillation (HCC) - on Eliquis        Consultants: Cardio Procedures performed: Stress test  Disposition: Home Diet recommendation:  Discharge Diet Orders (From admission, onward)     Start     Ordered   07/31/23 0000  Diet - low sodium heart healthy        07/31/23 1525           Carb modified diet DISCHARGE MEDICATION: Allergies as of 07/31/2023       Reactions   K Phos Mono-sod Phos Di & Mono Diarrhea   Sulfur Dioxide Hives   Darunavir    Other Reaction(s): Urticaria, Eruption of skin, Urticaria, Eruption of skin   Elemental Sulfur Other (See Comments)   intolerance   Phosphorus Other (See Comments)   intolerance   Potassium Nausea And Vomiting, Other (See Comments)   Potassium Phosphate    Other reaction(s): Diarrhea   Amoxicillin Rash, Hives        Medication List     STOP taking these medications    doxycycline  50 MG tablet  Commonly known as: ADOXA   spironolactone 25 MG tablet Commonly known as: ALDACTONE   valACYclovir  500 MG tablet Commonly known as: VALTREX    valsartan  160 MG tablet Commonly known as: DIOVAN        TAKE these medications    alfuzosin  10 MG 24 hr tablet Commonly known as: UROXATRAL  Take 1 tablet (10 mg total) by mouth daily with breakfast.   amLODipine  5 MG tablet Commonly known as: NORVASC  Take 5 mg by mouth at bedtime.   aspirin  EC 81 MG tablet Take 81 mg by mouth daily. Swallow  whole.   augmented betamethasone dipropionate 0.05 % cream Commonly known as: DIPROLENE-AF Apply topically 2 (two) times daily as needed.   azithromycin  250 MG tablet Commonly known as: ZITHROMAX  Take 250 mg by mouth. MONDAY,WEDNESDAY AND FRIDAY   BLADDER 2.2 PO Take 1 tablet by mouth daily.   Eliquis  2.5 MG Tabs tablet Generic drug: apixaban  TAKE 1 TABLET TWICE A DAY   guaifenesin  400 MG Tabs tablet Commonly known as: HUMIBID E Take 400 mg by mouth 4 (four) times daily as needed.   nitroGLYCERIN  0.4 MG SL tablet Commonly known as: Nitrostat  Place 1 tablet (0.4 mg total) under the tongue every 5 (five) minutes as needed for chest pain.   pantoprazole  40 MG tablet Commonly known as: PROTONIX  Take 1 tablet (40 mg total) by mouth at bedtime.   predniSONE  5 MG tablet Commonly known as: DELTASONE  Take 1 tablet by mouth daily.   sildenafil  20 MG tablet Commonly known as: REVATIO  Take 1 to 5 tablets daily as needed 1 hour prior to intercourse   simvastatin  20 MG tablet Commonly known as: ZOCOR  Take 20 mg by mouth daily.   torsemide 20 MG tablet Commonly known as: DEMADEX Take 1 tablet by mouth daily.   Vevye  0.1 % Soln Generic drug: cycloSPORINE  Apply to eye.   cycloSPORINE  (PF) 0.09 % Soln Apply 1 drop to eye 2 (two) times daily.   Vitamin D  125 MCG (5000 UT) Caps Take 5,000 Units by mouth daily.        Follow-up Information     [name], PA-C. Schedule an appointment as soon as possible for a visit in 1 week(s).   Specialty: Physician Assistant Why: [facility] Discharge F/UP Contact information: [address] [place] [address] [phone]         Sammy Crisp, MD. Schedule an appointment as soon as possible for a visit in 2 week(s).   Specialty: Cardiology Why: [facility] Discharge F/UP Contact information: [address] [place] [address] [phone]         Boyce Byes, MD. Schedule an appointment as soon as possible for a visit in 2 week(s).   Specialties: Cardiology, Radiology Why: [facility] Discharge F/UP Contact information: [address] [place] [address] [phone]                Discharge Exam: Cleavon Curls Weights   07/30/23 1048  Weight: 102.5 kg   Constitutional:      General: He is not in acute distress.    Appearance: He is obese. He is not toxic-appearing.  HENT:     Head: Normocephalic and atraumatic.  Eyes:     Extraocular Movements: Extraocular movements intact.     Pupils: Pupils are equal, round, and reactive to light.  Cardiovascular:     Regular Rate and Rhythm     Heart sounds: No murmur  heard. Pulmonary:     Effort: Pulmonary effort is normal. No tachypnea or respiratory distress.     Breath sounds: Normal breath sounds.  Abdominal:     General: Abdomen is protuberant.     Tenderness: There is no abdominal tenderness.  Skin:    General: Skin is warm and dry.  Neurological:     General: No focal deficit present.     Mental Status: He is alert and oriented to person, place, and time.  Psychiatric:        Mood and Affect: Mood normal.        Behavior: Behavior normal.   Condition at discharge: good  The results of significant diagnostics from this hospitalization (including imaging, microbiology, ancillary and laboratory) are listed below for reference.   Imaging Studies: NM Myocar Multi W/Spect W/Wall Motion / EF Result Date: 07/31/2023   Low risk, probably normal pharmacologic myocardial perfusion stress test.   There is a moderate in size, moderate in severity, fixed apical anterior, apical lateral, and apical defect most consistent with attenuation artifact but cannot rule out scar.   There is a small in size, moderate in severity, fixed basal inferior defect consistent with diaphragmatic attenuation.   No significant ischemia is identified.   Left ventricular systolic function is normal  with normal wall motion (LVEF > 65%).   Attenuation correction could not be performed, which limits sensitivity and specificity of the study.   Allowing for differences in technique, there has been no significant change from the prior SPECT/CT study on 10/29/2021.   DG Chest Port 1 View Result Date: 07/30/2023 CLINICAL DATA:  Chest pain with shortness of breath. Dizziness after receiving nitroglycerin . EXAM: PORTABLE CHEST 1 VIEW COMPARISON:  Radiographs 04/11/2023 and 01/21/2023.  CT 06/12/2023. FINDINGS: 1111 hours. Lordotic positioning with lower lung volumes. Allowing for this, the heart size and mediastinal contours are grossly stable with increased right paratracheal density attributed to vascular structures. Patchy airspace opacities at both lung bases appear mildly improved from previous radiographs and CT. No new airspace disease, pleural effusion or pneumothorax. No acute osseous findings. Telemetry leads overlie the chest. IMPRESSION: Mildly improved bibasilar airspace opacities, likely resolving pneumonia. No new findings identified. Electronically Signed   By: Elmon Hagedorn M.D.   On: 07/30/2023 11:27    Microbiology: Results for orders placed or performed in visit on 06/10/23  Microscopic Examination     Status: Abnormal   Collection Time: 06/10/23  1:08 PM   Urine  Result Value Ref Range Status   WBC, UA 0-5 0 - 5 /hpf Final   RBC, Urine 0-2 0 - 2 /hpf Final   Epithelial Cells (non renal) 0-10 0 - 10 /hpf Final   Casts Present (A) None seen /lpf Final   Cast Type Hyaline casts N/A Final   Bacteria, UA None seen None seen/Few Final    Labs: CBC: Recent Labs  Lab 07/30/23 1053 07/31/23 0524  WBC 12.5* 11.1*  NEUTROABS  --  6.4  HGB 14.7 14.2  HCT 45.0 43.0  MCV 105.1* 105.1*  PLT 224 214   Basic Metabolic Panel: Recent Labs  Lab 07/30/23 1053 07/31/23 0524  NA 142 142  K 3.7 3.9  CL 106 109  CO2 27 26  GLUCOSE 106* 94  BUN 51* 41*  CREATININE 2.59* 1.90*   CALCIUM 9.1 8.8*   Liver Function Tests: No results for input(s): "AST", "ALT", "ALKPHOS", "BILITOT", "PROT", "ALBUMIN" in the last 168 hours. CBG: No results for input(s): "  GLUCAP" in the last 168 hours.  Discharge time spent: greater than 30 minutes.  Signed: Brenna Cam, MD [facility] 08/02/2023

## 2023-08-04 ENCOUNTER — Ambulatory Visit (INDEPENDENT_AMBULATORY_CARE_PROVIDER_SITE_OTHER): Admitting: Podiatry

## 2023-08-04 ENCOUNTER — Encounter: Payer: Self-pay | Admitting: Podiatry

## 2023-08-04 DIAGNOSIS — L03032 Cellulitis of left toe: Secondary | ICD-10-CM

## 2023-08-04 DIAGNOSIS — S90222A Contusion of left lesser toe(s) with damage to nail, initial encounter: Secondary | ICD-10-CM

## 2023-08-04 DIAGNOSIS — R39198 Other difficulties with micturition: Secondary | ICD-10-CM | POA: Insufficient documentation

## 2023-08-04 DIAGNOSIS — F529 Unspecified sexual dysfunction not due to a substance or known physiological condition: Secondary | ICD-10-CM | POA: Insufficient documentation

## 2023-08-04 DIAGNOSIS — R5381 Other malaise: Secondary | ICD-10-CM | POA: Insufficient documentation

## 2023-08-04 DIAGNOSIS — K8020 Calculus of gallbladder without cholecystitis without obstruction: Secondary | ICD-10-CM | POA: Insufficient documentation

## 2023-08-04 DIAGNOSIS — R1310 Dysphagia, unspecified: Secondary | ICD-10-CM | POA: Insufficient documentation

## 2023-08-04 DIAGNOSIS — K1120 Sialoadenitis, unspecified: Secondary | ICD-10-CM | POA: Insufficient documentation

## 2023-08-04 DIAGNOSIS — D369 Benign neoplasm, unspecified site: Secondary | ICD-10-CM | POA: Insufficient documentation

## 2023-08-04 DIAGNOSIS — Z049 Encounter for examination and observation for unspecified reason: Secondary | ICD-10-CM | POA: Insufficient documentation

## 2023-08-04 DIAGNOSIS — L570 Actinic keratosis: Secondary | ICD-10-CM | POA: Insufficient documentation

## 2023-08-04 NOTE — Patient Instructions (Signed)

## 2023-08-04 NOTE — Progress Notes (Signed)
 Subjective:  Patient ID: Jason Ochs., male    DOB: Sep 19, 1940,  MRN: [id number] HPI Chief Complaint  Patient presents with   Toe Pain    2nd toe left - stumped nail couple weeks ago, no sore, but noticed it was bleeding a few days ago   New Patient (Initial Visit)    83 y.o. male presents with the above complaint.   ROS: Denies fever chills nausea mobic muscle aches pains calf pain back pain chest pain shortness of breath.  Past Medical History:  Diagnosis Date   Bladder cancer (HCC)    CAD (coronary artery disease)    a. 2006 s/p PCI of LAD and D1; b. 07/2009 Cath: Patent LAD stent, occluded D1->Med rx; c. 01/2013 Cath: LM nl, LAD 20p, patent stent, D1 70-80p ISR, D2 nl, LCX 10, RCA nl, EF 60-65%; d. 10/2021 MV: fixed apical/apical lateral defect->artifact though cannot r/o scar. No ischemia. EF >65%.   CKD (chronic kidney disease), stage III (HCC)    Diastolic dysfunction    a. 09/2022 Echo: EF 55-60%, no rwma, GrI DD, nl RV fxn, no significant valvular dzs.   GERD (gastroesophageal reflux disease)    Hyperlipidemia LDL goal <70    OSA (obstructive sleep apnea)    PAF (paroxysmal atrial fibrillation) (HCC)    a. 10/2022 Zio: predominantly sinus rhythm, avg 66 (39-122). 6% afib burden, longest 1h 18m @ 102. Freq PACs (22.4%), freq PVCs (5.1%). 12 runs NSVT up to 12 beats @ 167. No prolonged pauses. Triggered events = RSR, PACs, PVCs; b. CHA2DS2VASc = 6-->eliquis  2.5 bid (age/creat).   Primary hypertension    PSVT (paroxysmal supraventricular tachycardia) (HCC)    Right upper lobe pulmonary nodule    Stroke [facility])    Past Surgical History:  Procedure Laterality Date   CARDIAC SURGERY     CHOLECYSTECTOMY     COLONOSCOPY WITH PROPOFOL  N/A 04/21/2022   Procedure: COLONOSCOPY WITH PROPOFOL ;  Surgeon: Shane Darling, MD;  Location: [facility] ENDOSCOPY;  Service: Endoscopy;  Laterality: N/A;   CORONARY ANGIOPLASTY     "two stents in 2007"   ESOPHAGOGASTRODUODENOSCOPY (EGD) WITH  PROPOFOL  N/A 04/21/2022   Procedure: ESOPHAGOGASTRODUODENOSCOPY (EGD) WITH PROPOFOL ;  Surgeon: Shane Darling, MD;  Location: [facility] ENDOSCOPY;  Service: Endoscopy;  Laterality: N/A;   EYE SURGERY     NASAL SEPTUM SURGERY      Current Outpatient Medications:    alfuzosin  (UROXATRAL ) 10 MG 24 hr tablet, Take 1 tablet (10 mg total) by mouth daily with breakfast., Disp: 30 tablet, Rfl: 6   amLODipine  (NORVASC ) 5 MG tablet, Take 5 mg by mouth at bedtime., Disp: , Rfl:    aspirin  EC 81 MG tablet, Take 81 mg by mouth daily. Swallow whole., Disp: , Rfl:    augmented betamethasone dipropionate (DIPROLENE-AF) 0.05 % cream, Apply topically 2 (two) times daily as needed., Disp: , Rfl:    Cholecalciferol  (VITAMIN D ) 125 MCG (5000 UT) CAPS, Take 5,000 Units by mouth daily., Disp: , Rfl:    cycloSPORINE , PF, 0.09 % SOLN, Apply 1 drop to eye 2 (two) times daily., Disp: , Rfl:    ELIQUIS  2.5 MG TABS tablet, TAKE 1 TABLET TWICE A DAY, Disp: 180 tablet, Rfl: 3   guaifenesin  (HUMIBID E) 400 MG TABS tablet, Take 400 mg by mouth 4 (four) times daily as needed., Disp: , Rfl:    nitroGLYCERIN  (NITROSTAT ) 0.4 MG SL tablet, Place 1 tablet (0.4 mg total) under the tongue every 5 (five) minutes as  needed for chest pain., Disp: 25 tablet, Rfl: 3   Nutritional Supplements (BLADDER 2.2 PO), Take 1 tablet by mouth daily., Disp: , Rfl:    pantoprazole  (PROTONIX ) 40 MG tablet, Take 1 tablet (40 mg total) by mouth at bedtime., Disp: 90 tablet, Rfl: 3   predniSONE  (DELTASONE ) 5 MG tablet, Take 1 tablet by mouth daily., Disp: , Rfl:    sildenafil  (REVATIO ) 20 MG tablet, Take 1 to 5 tablets daily as needed 1 hour prior to intercourse, Disp: 30 tablet, Rfl: 11   simvastatin  (ZOCOR ) 20 MG tablet, Take 20 mg by mouth daily., Disp: , Rfl:    torsemide (DEMADEX) 20 MG tablet, Take 1 tablet by mouth daily., Disp: , Rfl:    VEVYE  0.1 % SOLN, Apply to eye., Disp: , Rfl:   Allergies  Allergen Reactions   K Phos Mono-Sod Phos Di &  Mono Diarrhea   Sulfur Dioxide Hives   Darunavir     Other Reaction(s): Urticaria, Eruption of skin, Urticaria, Eruption of skin   Elemental Sulfur Other (See Comments)    intolerance   Phosphorus Other (See Comments)    intolerance   Potassium Nausea And Vomiting and Other (See Comments)   Potassium Phosphate     Other reaction(s): Diarrhea   Amoxicillin Rash and Hives   Review of Systems Objective:  There were no vitals filed for this visit.  General: Well developed, nourished, in no acute distress, alert and oriented x3   Dermatological: Skin is warm, dry and supple bilateral. Nails x 10 are well maintained; remaining integument appears unremarkable at this time. There are no open sores, no preulcerative lesions, no rash or signs of infection present.  Hallux nails bilaterally were removed permanently.  Second digit left does demonstrate distal onycholysis with near avulsion of the toenail.  There was bleeding with is dried.  Vascular: Dorsalis Pedis artery and Posterior Tibial artery pedal pulses are 2/4 bilateral with immedate capillary fill time. Pedal hair growth present. No varicosities and no lower extremity edema present bilateral.   Neruologic: Grossly intact via light touch bilateral. Vibratory intact via tuning fork bilateral. Protective threshold with Semmes Wienstein monofilament intact to all pedal sites bilateral. Patellar and Achilles deep tendon reflexes 2+ bilateral. No Babinski or clonus noted bilateral.   Musculoskeletal: No gross boney pedal deformities bilateral. No pain, crepitus, or limitation noted with foot and ankle range of motion bilateral. Muscular strength 5/5 in all groups tested bilateral.  Gait: Unassisted, Nonantalgic.    Radiographs:  None taken  Assessment & Plan:   Assessment: Nail avulsion second digit left  Plan: After local anesthetic was administered to the child area was prepped and draped in sterile sterile fashion.  The nail plate  was avulsed all necrotic tissue and dried blood was resected.  Area was copiously lavaged with alcohol  and Silvadene dry sterile compressive dressing was applied.  Follow-up with him in 2 weeks he was given both oral and written instructions for care of soaking of the toe.     Kenard Morawski T. [place], [place]

## 2023-08-18 ENCOUNTER — Ambulatory Visit: Admitting: Podiatry

## 2023-08-19 ENCOUNTER — Ambulatory Visit: Admitting: Cardiology

## 2023-08-19 ENCOUNTER — Ambulatory Visit: Payer: No Typology Code available for payment source | Attending: Internal Medicine | Admitting: Internal Medicine

## 2023-08-19 ENCOUNTER — Encounter: Payer: Self-pay | Admitting: Internal Medicine

## 2023-08-19 VITALS — BP 150/78 | HR 93 | Ht 68.0 in | Wt 236.2 lb

## 2023-08-19 DIAGNOSIS — I2510 Atherosclerotic heart disease of native coronary artery without angina pectoris: Secondary | ICD-10-CM

## 2023-08-19 DIAGNOSIS — I471 Supraventricular tachycardia, unspecified: Secondary | ICD-10-CM

## 2023-08-19 DIAGNOSIS — N1832 Chronic kidney disease, stage 3b: Secondary | ICD-10-CM | POA: Diagnosis present

## 2023-08-19 DIAGNOSIS — I25118 Atherosclerotic heart disease of native coronary artery with other forms of angina pectoris: Secondary | ICD-10-CM | POA: Diagnosis present

## 2023-08-19 DIAGNOSIS — I480 Paroxysmal atrial fibrillation: Secondary | ICD-10-CM | POA: Insufficient documentation

## 2023-08-19 DIAGNOSIS — R0602 Shortness of breath: Secondary | ICD-10-CM

## 2023-08-19 DIAGNOSIS — M7989 Other specified soft tissue disorders: Secondary | ICD-10-CM

## 2023-08-19 DIAGNOSIS — I10 Essential (primary) hypertension: Secondary | ICD-10-CM | POA: Diagnosis present

## 2023-08-19 DIAGNOSIS — E785 Hyperlipidemia, unspecified: Secondary | ICD-10-CM | POA: Insufficient documentation

## 2023-08-19 DIAGNOSIS — I5023 Acute on chronic systolic (congestive) heart failure: Secondary | ICD-10-CM | POA: Insufficient documentation

## 2023-08-19 MED ORDER — FUROSEMIDE 40 MG PO TABS: 40.0000 mg | ORAL_TABLET | Freq: Every day | ORAL | 3 refills | Status: DC

## 2023-08-19 NOTE — Patient Instructions (Signed)
 Medication Instructions:  Your physician recommends the following medication changes.  STOP TAKING: Spironolactone  START TAKING: Lasix  40 mg by mouth daily  *If you need a refill on your cardiac medications before your next appointment, please call your pharmacy*  Lab Work: Your provider would like for you to return in 1 week to have the following labs drawn: BMP.   Please go to [facility] [address] ([facility]) [address], [place] [address] You do not need an appointment.  They are open from 8 am- 4:30 pm.  Lunch from 1:00 pm- 2:00 pm You will not need to be fasting.    Testing/Procedures: Your physician has requested that you have an echocardiogram. Echocardiography is a painless test that uses sound waves to create images of your heart. It provides your doctor with information about the size and shape of your heart and how well your heart's chambers and valves are working.   You may receive an ultrasound enhancing agent through an IV if needed to better visualize your heart during the echo. This procedure takes approximately one hour.  There are no restrictions for this procedure.  This will take place at [address] [facility]) [address], [place] [address]  Please note: We ask at that you not bring children with you during ultrasound (echo/ vascular) testing. Due to room size and safety concerns, children are not allowed in the ultrasound rooms during exams. Our front office staff cannot provide observation of children in our lobby area while testing is being conducted. An adult accompanying a patient to their appointment will only be allowed in the ultrasound room at the discretion of the ultrasound technician under special circumstances. We apologize for any inconvenience.   Follow-Up: At [facility], you and your health needs are our priority.  As part of our continuing mission to provide you with exceptional heart care, our  providers are all part of one team.  This team includes your primary Cardiologist (physician) and Advanced Practice Providers or APPs (Physician Assistants and Nurse Practitioners) who all work together to provide you with the care you need, when you need it.  Your next appointment:   6 week(s)  Provider:   You will see one of the following Advanced Practice Providers on your designated Care Team:   Lonni Meager, NP Lesley Maffucci, PA-C Bernardino Bring, PA-C Cadence Franchester, PA-C Tylene Lunch, NP Barnie Hila, NP  Your physician recommends that you schedule a follow-up appointment with Dr. Cindie

## 2023-08-19 NOTE — Progress Notes (Deleted)
  Electrophysiology Office Follow up Visit Note:    Date:  08/19/2023   ID:  Jason Gates., DOB 08-Oct-1940, MRN [id number]  PCP:  Jason Houston, PA-C  [facility] Cardiologist:  Lonni Hanson, MD  [facility] Electrophysiologist:  OLE ONEIDA HOLTS, MD    Interval History:     Jason Gates. is a 83 y.o. male who presents for a follow up visit.   I last saw the patient January 21, 2023.  He has a history of tachybradycardia syndrome and atrial fibrillation.  He follows with Dr. Hanson.  He also has hypertension, CKD, obstructive sleep apnea on CPAP and stroke.  At the last appointment he had a low burden of atrial fibrillation and we landed on a conservative approach.  I thought he was a poor candidate for invasive EP procedures.  Given his bradycardia, antiarrhythmics were avoided given they may lead to pacemaker therapy being required.  He was in the hospital earlier this month with an episode of chest pain he was noted to be in asymptomatic atrial fibrillation at the time of arrival.  During hospitalization his atrial fibrillation was paroxysmal and asymptomatic.      Past medical, surgical, social and family history were reviewed.  ROS:   Please see the history of present illness.    All other systems reviewed and are negative.  EKGs/Labs/Other Studies Reviewed:    The following studies were reviewed today:          Physical Exam:    VS:  There were no vitals taken for this visit.    Wt Readings from Last 3 Encounters:  07/30/23 226 lb (102.5 kg)  06/10/23 229 lb (103.9 kg)  04/11/23 222 lb (100.7 kg)     GEN: no distress CARD: RRR, No MRG RESP: No IWOB. CTAB.      ASSESSMENT:    No diagnosis found. PLAN:    In order of problems listed above:  #Paroxysmal atrial fibrillation Relatively asymptomatic.  Rates are well-controlled even when in atrial fibrillation during recent hospitalization.  I would continue with a conservative  management strategy.  If his atrial fibrillation becomes symptomatic in the future, would need to consider amiodarone and the possibility of permanent pacing.  For now recommend continuing Eliquis  for stroke prophylaxis  #Hypertension *** goal today.  Recommend checking blood pressures 1-2 times per week at home and recording the values.  Recommend bringing these recordings to the primary care physician.    Signed, OLE HOLTS, MD, [facility], [facility] 08/19/2023 7:29 AM    Electrophysiology [facility]

## 2023-08-19 NOTE — Progress Notes (Signed)
 Cardiology Office Note:  .   Date:  08/21/2023  ID:  Jason Gates., DOB 1940/10/22, MRN [id number] PCP: Jason Houston, PA-C  [facility] Providers Cardiologist:  Jason Hanson, MD Electrophysiologist:  Jason ONEIDA HOLTS, MD     History of Present Illness: .   Jason Gates Jason Gates. is a 83 y.o. male with history of coronary artery disease status post remote PCI's to the LAD and diagonal branch that were complicated by what sounds like subacute stent thrombosis, paroxysmal atrial fibrillation, PSVT, hypertension, hyperlipidemia, chronic kidney disease, obstructive sleep apnea on CPAP, stroke, bladder cancer, and GERD, who presents for follow-up of coronary artery disease and atrial fibrillation.  He was hospitalized earlier this month with sudden onset of substernal chest pain radiating to the neck and jaw.  He was initially in atrial fibrillation with a heart rate of 84 bpm but subsequently converted to sinus rhythm.  Troponin was flat and minimally elevated.  Creatinine was above baseline at 2.6.  He underwent pharmacologic myocardial perfusion stress test that was low risk, probably normal as detailed below.  It was felt that his PAF may have precipitated his discomfort.  We agreed to defer catheterization.  I recommended addition of amiodarone for rhythm control but Jason Gates and his son wish to discuss this with Jason Gates first.  Jason Gates was scheduled to see Jason Gates earlier today before our visit but missed his appointment because things came up at home that prevented him from making it to our office in a timely fashion.  Today, Jason Gates reports that he has been feeling fairly well from a heart standpoint but is frustrated by being tired all the time.  He also recounts an episode since leaving the hospital earlier this month during which it felt like his lower abdomen, pelvis, and legs were being weighted down.  He could not move his legs for a few seconds, after which time  everything went back to normal.  He has not had any other focal neurologic deficits.  He denies chest pain, shortness of breath, lightheadedness, and edema.  He has put on some weight over the last few months (he estimates 15 pounds) since starting prednisone  about 2 months ago at the direction of Jason Gates.  He does not have any orthopnea.  He has been using his CPAP every night but still feels sleepy during the day.  He has sporadic palpitations though is typically not aware of being in atrial fibrillation.  He followed up with his nephrologist last week and notes that his creatinine was 2.3, up from 1.8 at the time of discharge.  ROS: See HPI  Studies Reviewed: Jason Gates   EKG Interpretation Date/Time:  Wednesday August 19 2023 15:30:09 EDT Ventricular Rate:  93 PR Interval:  182 QRS Duration:  84 QT Interval:  370 QTC Calculation: 460 R Axis:   39  Text Interpretation: Atrial fibrillation Nonspecific ST and T wave abnormality Abnormal ECG When compared with ECG of 30-Jul-2023 11:38, Atrial fibrillation Nonspecific T wave abnormality has replaced inverted T waves in Sinus bradycardia Confirmed by Jason Gates, Jason [id number]) on 08/21/2023 6:09:26 PM    Pharmacologic MPI (07/31/2023): Low risk, probably normal pharmacologic myocardial perfusion stress test.  There is a moderate-sized, moderate severity, fixed apical apical lateral and apical anterior defect most consistent with attenuation artifact but cannot rule out scar.  Diaphragmatic attenuation also noted.  No significant ischemia seen.  LVEF greater than 65%.  Allowing for differences in technique, no  significant change seen from prior study in 10/2021, as attenuation correction could not be performed on this study.  Risk Assessment/Calculations:    CHA2DS2-VASc Score = 6   This indicates a 9.7% annual risk of stroke. The patient's score is based upon: CHF History: 0 HTN History: 1 Diabetes History: 0 Stroke History: 2 Vascular Disease  History: 1 Age Score: 2 Gender Score: 0        Physical Exam:   VS:  BP (!) 150/78 (BP Location: Left Arm, Patient Position: Sitting, Cuff Size: Normal)   Pulse 93   Ht 5' 8 (1.727 m)   Wt 236 lb 3.2 oz (107.1 kg)   SpO2 95%   BMI 35.91 kg/m    Wt Readings from Last 3 Encounters:  08/19/23 236 lb 3.2 oz (107.1 kg)  07/30/23 226 lb (102.5 kg)  06/10/23 229 lb (103.9 kg)    General:  NAD. Neck: Unable to accurately assess JVP due to body habitus. Lungs: Clear to auscultation bilaterally without wheezes or crackles. Heart: Irregularly irregular rhythm with 1/6 systolic murmur. Abdomen: Soft, nontender, nondistended. Extremities: 1+ pitting edema both calves.  ASSESSMENT AND PLAN: .    Coronary artery disease with stable angina and hyperlipidemia: No further angina reported since recent hospitalization for chest pain.  Myocardial perfusion stress test was abnormal but thought to be low risk and not significantly changed compared to 10/2021.  As atrial fibrillation was noted when Jason Gates first presented to the ER, there was concern that his symptoms may have been due to transient atrial fibrillation with rapid ventricular response.  I recommended initiating a rhythm control strategy with amiodarone as his CAD and renal insufficiency may came a poor candidate for other therapies such as flecainide and dofetilide.  He and his son wish to discuss this with Jason Gates before committing to treatment.  Unfortunately, Jason Gates was unable to make his appointment with Jason Gates today and is in atrial fibrillation at this time (albeit asymptomatic).  Given reassuring stress test, lack of further chest pain, and advanced CKD, we have agreed to defer cardiac catheterization.  Continue apixaban  and lieu of aspirin  for secondary prevention as well as antianginal therapy with amlodipine .  Continue simvastatin  for now; continue lipid follow-up through PCP with target LDL less than 70.  Paroxysmal  atrial fibrillation: Jason Gates is in atrial fibrillation today, albeit with further chest pain or palpitations.  However, he has evidence of progressive heart failure, which could be due to more frequent PAF.  I still think he would benefit from a rhythm control strategy, which we again discussed today.  He remains reluctant to proceed without Dr. Hiram input.  As he was unable to make today's visit with Jason Gates, we will try to reschedule him as soon as possible.  I reinforced the importance of keeping this appointment.  For now we will defer rate controlling agents given normal ventricular rates and bradycardia when in sinus rhythm.  Continue apixaban  2.5 mg twice daily for stroke prevention, based on age and creatinine.  Acute on chronic HFpEF: Mr. Booher has notable leg edema on exam today and is also put on 15 pounds per his report over the last 2 months.  His weight is up 10 pounds since last measurement on 07/30/2023 in the hospital.  He was previously on torsemide, though this was held due to acute kidney injury superimposed on chronic kidney disease during recent admission.  He reports that recent labs through his nephrologist demonstrated a  creatinine of 2.3.  I think he will need more aggressive diuresis.  I have recommended discontinuation of spironolactone, as he is at risk for significant hyperkalemia with this and his renal insufficiency, and addition of furosemide  40 mg daily.  We will plan to repeat a basic metabolic panel in 1 week.  We will also arrange for an echocardiogram to ensure that he has not developed new cardiomyopathy or valvular heart disease (LVEF normal on recent Myoview ).  Sodium restriction was encouraged.  Chronic kidney disease stage IIIb: Most recent creatinine 2.3 per Mr. Scalise report.  We will repeat a BMP in 1 week given that we are stopping spironolactone and adding furosemide  today.  Hypertension: Continue amlodipine  5 mg daily.  Discontinue spironolactone and  add furosemide , as above.  Sodium restriction encouraged.    Dispo: Return to clinic in 6 weeks.  Signed, Jason Hanson, MD

## 2023-08-21 ENCOUNTER — Encounter: Payer: Self-pay | Admitting: Internal Medicine

## 2023-08-25 ENCOUNTER — Other Ambulatory Visit: Payer: Self-pay | Admitting: *Deleted

## 2023-08-25 ENCOUNTER — Telehealth: Payer: Self-pay | Admitting: Internal Medicine

## 2023-08-25 MED ORDER — TORSEMIDE 20 MG PO TABS: 20.0000 mg | ORAL_TABLET | Freq: Every day | ORAL | 0 refills | Status: DC

## 2023-08-25 NOTE — Telephone Encounter (Signed)
 Pt c/o medication issue:  1. Name of Medication: furosemide  (LASIX ) 40 MG tablet   2. How are you currently taking this medication (dosage and times per day)? As written   3. Are you having a reaction (difficulty breathing--STAT)? No   4. What is your medication issue? Pt called in stating he has not been taking this medication. He states years ago when he was on this medication he had cramps and couldn't sleep. He states he started having the same side effects and has not taken it. He asked if there is something else he can start on.

## 2023-08-25 NOTE — Telephone Encounter (Signed)
 I recommend torsemide 20 mg daily instead.  Lonni Hanson, MD [facility]

## 2023-08-25 NOTE — Telephone Encounter (Signed)
 Spoke with patient; explained that I sent his message to Dr. Mady for recommendations on medications change; explained to pt that Lasix  40 mg tab is discontinued and replaced with Torsemide 20 mg by mouth daily.   New order for Torsemide 20 mg sent to [facility] [address] [place] per pt request.   If he is able to tolerate medication without cramps and disrupted sleep, then he will request additional prescriptions from mail order service.  Pt will call with an update on tolerance of medication.  Pt expressed his appreciation of medication being changed and for the call back.

## 2023-08-27 ENCOUNTER — Ambulatory Visit: Payer: Self-pay | Admitting: Internal Medicine

## 2023-08-27 DIAGNOSIS — Z79899 Other long term (current) drug therapy: Secondary | ICD-10-CM

## 2023-08-27 LAB — BASIC METABOLIC PANEL WITH GFR
BUN/Creatinine Ratio: 17 (ref 10–24)
BUN: 33 mg/dL — ABNORMAL HIGH (ref 8–27)
CO2: 26 mmol/L (ref 20–29)
Calcium: 9.9 mg/dL (ref 8.6–10.2)
Chloride: 102 mmol/L (ref 96–106)
Creatinine, Ser: 1.95 mg/dL — ABNORMAL HIGH (ref 0.76–1.27)
Glucose: 89 mg/dL (ref 70–99)
Potassium: 4.9 mmol/L (ref 3.5–5.2)
Sodium: 148 mmol/L — ABNORMAL HIGH (ref 134–144)
eGFR: 33 mL/min/{1.73_m2} — ABNORMAL LOW (ref >59)

## 2023-09-01 ENCOUNTER — Ambulatory Visit: Attending: Internal Medicine

## 2023-09-01 DIAGNOSIS — I5023 Acute on chronic systolic (congestive) heart failure: Secondary | ICD-10-CM | POA: Insufficient documentation

## 2023-09-01 LAB — ECHOCARDIOGRAM COMPLETE
AR max vel: 2.37 cm2
AV Area VTI: 2.51 cm2
AV Area mean vel: 2.25 cm2
AV Mean grad: 3 mmHg
AV Peak grad: 5.8 mmHg
Ao pk vel: 1.2 m/s
Calc EF: 52.6 %
S' Lateral: 3.9 cm
Single Plane A2C EF: 50.2 %
Single Plane A4C EF: 54.1 %

## 2023-09-03 NOTE — Telephone Encounter (Signed)
 Patient returned RN's call.

## 2023-09-04 NOTE — Telephone Encounter (Signed)
 Pt calling back

## 2023-09-16 ENCOUNTER — Ambulatory Visit

## 2023-09-16 ENCOUNTER — Ambulatory Visit: Admitting: Cardiology

## 2023-09-16 ENCOUNTER — Encounter: Payer: Self-pay | Admitting: Cardiology

## 2023-09-16 VITALS — BP 118/57 | HR 51 | Ht 68.0 in | Wt 236.0 lb

## 2023-09-16 DIAGNOSIS — I480 Paroxysmal atrial fibrillation: Secondary | ICD-10-CM | POA: Insufficient documentation

## 2023-09-16 DIAGNOSIS — R0902 Hypoxemia: Secondary | ICD-10-CM | POA: Diagnosis not present

## 2023-09-16 DIAGNOSIS — I10 Essential (primary) hypertension: Secondary | ICD-10-CM | POA: Insufficient documentation

## 2023-09-16 DIAGNOSIS — U071 COVID-19: Secondary | ICD-10-CM | POA: Diagnosis not present

## 2023-09-16 NOTE — Progress Notes (Signed)
 Electrophysiology Office Follow up Visit Note:    Date:  09/16/2023   ID:  Jason Gates Nilda Mickey., DOB 15-Oct-1940, MRN [id number]  PCP:  Franchot Houston, PA-C  [facility] Cardiologist:  Lonni Hanson, MD  [facility] Electrophysiologist:  OLE ONEIDA HOLTS, MD    Interval History:     Jason Gates. is a 83 y.o. male who presents for a follow up visit.   I last saw the patient January 21, 2023.  He has a history of tachybradycardia syndrome and atrial fibrillation.  He follows with Dr. Hanson.  He also has hypertension, CKD, obstructive sleep apnea on CPAP and stroke.  At the last appointment he had a low burden of atrial fibrillation and we landed on a conservative approach.  I thought he was a poor candidate for invasive EP procedures.  Given his bradycardia, antiarrhythmics were avoided given they may lead to pacemaker therapy being required.  He was in the hospital earlier this month with an episode of chest pain he was noted to be in asymptomatic atrial fibrillation at the time of arrival.  During hospitalization his atrial fibrillation was paroxysmal and asymptomatic.  He is doing well today.  He reports fatigue and poor sleep quality.  He tells me that he will go to sleep usually around 11:00 or midnight and wake up about an hour and a half later wide-awake and unable to go back to sleep for hours at a time.  This is leading to daytime somnolence.  He has a history of sleep apnea and uses CPAP.  He has used CPAP since 2004.  He gets his machine regularly updated by the [place].          Past medical, surgical, social and family history were reviewed.  ROS:   Please see the history of present illness.    All other systems reviewed and are negative.  EKGs/Labs/Other Studies Reviewed:    The following studies were reviewed today:          Physical Exam:    VS:  BP (!) 118/57 (BP Location: Left Arm, Patient Position: Sitting, Cuff Size: Normal)   Pulse (!) 51    Ht 5' 8 (1.727 m)   Wt 236 lb (107 kg)   SpO2 95%   BMI 35.88 kg/m     Wt Readings from Last 3 Encounters:  09/16/23 236 lb (107 kg)  08/19/23 236 lb 3.2 oz (107.1 kg)  07/30/23 226 lb (102.5 kg)     GEN: no distress CARD: RRR, No MRG RESP: No IWOB. CTAB.      ASSESSMENT:    1. Paroxysmal atrial fibrillation (HCC)   2. Essential hypertension    PLAN:    In order of problems listed above:  #Paroxysmal atrial fibrillation Relatively asymptomatic.  Rates are well-controlled even when in atrial fibrillation during recent hospitalization.  I would continue with a conservative management strategy.    I am going to update his heart rhythm monitor with a 2-week ZIO to quantify his atrial fibrillation burden.  If his atrial fibrillation becomes symptomatic in the future, would need to consider an antiarrhythmic drug and the possibility of permanent pacing.  For now recommend continuing Eliquis  for stroke prophylaxis  #Hypertension At goal today.  Recommend checking blood pressures 1-2 times per week at home and recording the values.  Recommend bringing these recordings to the primary care physician.  Follow-up 3 months after he has had a chance to wear the ZIO monitor.  Follow-up with APP.  Signed, Ole Holts, MD, [facility], [facility] 09/16/2023 2:49 PM    Electrophysiology [facility]

## 2023-09-16 NOTE — Patient Instructions (Signed)
 Medication Instructions:  Your physician recommends that you continue on your current medications as directed. Please refer to the Current Medication list given to you today.  *If you need a refill on your cardiac medications before your next appointment, please call your pharmacy*  Testing/Procedures: Event Monitor Your physician has recommended that you wear an event monitor. Event monitors are medical devices that record the heart's electrical activity. Doctors most often us  these monitors to diagnose arrhythmias. Arrhythmias are problems with the speed or rhythm of the heartbeat. The monitor is a small, portable device. You can wear one while you do your normal daily activities. This is usually used to diagnose what is causing palpitations/syncope (passing out).  Follow-Up: At [facility], you and your health needs are our priority.  As part of our continuing mission to provide you with exceptional heart care, our providers are all part of one team.  This team includes your primary Cardiologist (physician) and Advanced Practice Providers or APPs (Physician Assistants and Nurse Practitioners) who all work together to provide you with the care you need, when you need it.  Your next appointment:   3 months  Provider:   Suzann Riddle, NP    ZIO XT- Long Term Monitor Instructions  Your physician has requested you wear a ZIO patch monitor for 14 days.  This is a single patch monitor. [employer] supplies one patch monitor per enrollment. Additional stickers are not available. Please do not apply patch if you will be having a Nuclear Stress Test,  Echocardiogram, Cardiac CT, MRI, or Chest Xray during the period you would be wearing the  monitor. The patch cannot be worn during these tests. You cannot remove and re-apply the  ZIO XT patch monitor.  Your ZIO patch monitor will be mailed 3 day USPS to your address on file. It may take 3-5 days  to receive your monitor after you have been  enrolled.  Once you have received your monitor, please review the enclosed instructions. Your monitor  has already been registered assigning a specific monitor serial # to you.  Billing and Patient Assistance Program Information  We have supplied [employer] with any of your insurance information on file for billing purposes. [employer] offers a sliding scale Patient Assistance Program for patients that do not have  insurance, or whose insurance does not completely cover the cost of the ZIO monitor.  You must apply for the Patient Assistance Program to qualify for this discounted rate.  To apply, please call [employer] at [phone], select option 4, select option 2, ask to apply for  Patient Assistance Program. M[place]eth will ask your household income, and how many people  are in your household. They will quote your out-of-pocket cost based on that information.  [employer] will also be able to set up a 75-month, interest-free payment plan if needed.  Applying the monitor   Shave hair from upper left chest.  Hold abrader disc by orange tab. Rub abrader in 40 strokes over the upper left chest as  indicated in your monitor instructions.  Clean area with 4 enclosed alcohol  pads. Let dry.  Apply patch as indicated in monitor instructions. Patch will be placed under collarbone on left  side of chest with arrow pointing upward.  Rub patch adhesive wings for 2 minutes. Remove white label marked 1. Remove the white  label marked 2. Rub patch adhesive wings for 2 additional minutes.  While looking in a mirror, press and release button in center of patch. A  small green light will  flash 3-4 times. This will be your only indicator that the monitor has been turned on.  Do not shower for the first 24 hours. You may shower after the first 24 hours.  Press the button if you feel a symptom. You will hear a small click. Record Date, Time and  Symptom in the Patient Logbook.  When you are ready to remove the  patch, follow instructions on the last 2 pages of Patient  Logbook. Stick patch monitor onto the last page of Patient Logbook.  Place Patient Logbook in the blue and white box. Use locking tab on box and tape box closed  securely. The blue and white box has prepaid postage on it. Please place it in the mailbox as  soon as possible. Your physician should have your test results approximately 7 days after the  monitor has been mailed back to [facility].  Call [facility] Customer Care at [phone] if you have questions regarding  your ZIO XT patch monitor. Call them immediately if you see an orange light blinking on your  monitor.  If your monitor falls off in less than 4 days, contact our Monitor department at [phone].  If your monitor becomes loose or falls off after 4 days call [employer] at [phone] for  suggestions on securing your monitor

## 2023-09-18 ENCOUNTER — Other Ambulatory Visit: Payer: Self-pay

## 2023-09-18 ENCOUNTER — Emergency Department (HOSPITAL_COMMUNITY)

## 2023-09-18 ENCOUNTER — Inpatient Hospital Stay (HOSPITAL_COMMUNITY)
Admission: EM | Admit: 2023-09-18 | Discharge: 2023-09-20 | DRG: 177 | Disposition: A | Discharge status: Home / Self Care | Attending: Internal Medicine | Admitting: Internal Medicine

## 2023-09-18 ENCOUNTER — Encounter (HOSPITAL_COMMUNITY): Payer: Self-pay | Admitting: Emergency Medicine

## 2023-09-18 DIAGNOSIS — J9621 Acute and chronic respiratory failure with hypoxia: Secondary | ICD-10-CM | POA: Diagnosis present

## 2023-09-18 DIAGNOSIS — Z79899 Other long term (current) drug therapy: Secondary | ICD-10-CM | POA: Diagnosis not present

## 2023-09-18 DIAGNOSIS — I480 Paroxysmal atrial fibrillation: Secondary | ICD-10-CM | POA: Diagnosis present

## 2023-09-18 DIAGNOSIS — Z7901 Long term (current) use of anticoagulants: Secondary | ICD-10-CM

## 2023-09-18 DIAGNOSIS — Z8673 Personal history of transient ischemic attack (TIA), and cerebral infarction without residual deficits: Secondary | ICD-10-CM | POA: Diagnosis not present

## 2023-09-18 DIAGNOSIS — Z9049 Acquired absence of other specified parts of digestive tract: Secondary | ICD-10-CM | POA: Diagnosis not present

## 2023-09-18 DIAGNOSIS — I10 Essential (primary) hypertension: Secondary | ICD-10-CM | POA: Diagnosis present

## 2023-09-18 DIAGNOSIS — I495 Sick sinus syndrome: Secondary | ICD-10-CM | POA: Diagnosis present

## 2023-09-18 DIAGNOSIS — K219 Gastro-esophageal reflux disease without esophagitis: Secondary | ICD-10-CM | POA: Diagnosis present

## 2023-09-18 DIAGNOSIS — G4733 Obstructive sleep apnea (adult) (pediatric): Secondary | ICD-10-CM | POA: Diagnosis present

## 2023-09-18 DIAGNOSIS — R0902 Hypoxemia: Secondary | ICD-10-CM | POA: Diagnosis present

## 2023-09-18 DIAGNOSIS — R0602 Shortness of breath: Secondary | ICD-10-CM | POA: Diagnosis present

## 2023-09-18 DIAGNOSIS — N1832 Chronic kidney disease, stage 3b: Secondary | ICD-10-CM | POA: Diagnosis present

## 2023-09-18 DIAGNOSIS — U071 COVID-19: Principal | ICD-10-CM | POA: Diagnosis present

## 2023-09-18 DIAGNOSIS — I130 Hypertensive heart and chronic kidney disease with heart failure and stage 1 through stage 4 chronic kidney disease, or unspecified chronic kidney disease: Secondary | ICD-10-CM | POA: Diagnosis present

## 2023-09-18 DIAGNOSIS — J1282 Pneumonia due to coronavirus disease 2019: Secondary | ICD-10-CM | POA: Diagnosis present

## 2023-09-18 DIAGNOSIS — Z888 Allergy status to other drugs, medicaments and biological substances status: Secondary | ICD-10-CM | POA: Diagnosis not present

## 2023-09-18 DIAGNOSIS — Z87891 Personal history of nicotine dependence: Secondary | ICD-10-CM

## 2023-09-18 DIAGNOSIS — N189 Chronic kidney disease, unspecified: Secondary | ICD-10-CM

## 2023-09-18 DIAGNOSIS — Z833 Family history of diabetes mellitus: Secondary | ICD-10-CM | POA: Diagnosis not present

## 2023-09-18 DIAGNOSIS — Z955 Presence of coronary angioplasty implant and graft: Secondary | ICD-10-CM | POA: Diagnosis not present

## 2023-09-18 DIAGNOSIS — J9601 Acute respiratory failure with hypoxia: Secondary | ICD-10-CM | POA: Diagnosis present

## 2023-09-18 DIAGNOSIS — Z880 Allergy status to penicillin: Secondary | ICD-10-CM | POA: Diagnosis not present

## 2023-09-18 DIAGNOSIS — E669 Obesity, unspecified: Secondary | ICD-10-CM | POA: Diagnosis present

## 2023-09-18 DIAGNOSIS — I5030 Unspecified diastolic (congestive) heart failure: Secondary | ICD-10-CM | POA: Diagnosis present

## 2023-09-18 DIAGNOSIS — R42 Dizziness and giddiness: Secondary | ICD-10-CM

## 2023-09-18 DIAGNOSIS — I2510 Atherosclerotic heart disease of native coronary artery without angina pectoris: Secondary | ICD-10-CM | POA: Diagnosis present

## 2023-09-18 DIAGNOSIS — E785 Hyperlipidemia, unspecified: Secondary | ICD-10-CM | POA: Diagnosis present

## 2023-09-18 DIAGNOSIS — Z8249 Family history of ischemic heart disease and other diseases of the circulatory system: Secondary | ICD-10-CM | POA: Diagnosis not present

## 2023-09-18 DIAGNOSIS — I5032 Chronic diastolic (congestive) heart failure: Secondary | ICD-10-CM | POA: Diagnosis present

## 2023-09-18 DIAGNOSIS — Z8551 Personal history of malignant neoplasm of bladder: Secondary | ICD-10-CM | POA: Diagnosis not present

## 2023-09-18 DIAGNOSIS — Z7982 Long term (current) use of aspirin: Secondary | ICD-10-CM

## 2023-09-18 DIAGNOSIS — Z6835 Body mass index (BMI) 35.0-35.9, adult: Secondary | ICD-10-CM

## 2023-09-18 LAB — BASIC METABOLIC PANEL WITH GFR
Anion gap: 10 (ref 5–15)
BUN: 20 mg/dL (ref 8–23)
CO2: 29 mmol/L (ref 22–32)
Calcium: 9.1 mg/dL (ref 8.9–10.3)
Chloride: 103 mmol/L (ref 98–111)
Creatinine, Ser: 2.01 mg/dL — ABNORMAL HIGH (ref 0.61–1.24)
GFR, Estimated: 32 mL/min — ABNORMAL LOW (ref >60)
Glucose, Bld: 107 mg/dL — ABNORMAL HIGH (ref 70–99)
Potassium: 3.5 mmol/L (ref 3.5–5.1)
Sodium: 142 mmol/L (ref 135–145)

## 2023-09-18 LAB — CBC
HCT: 43.8 % (ref 39.0–52.0)
HCT: 46.2 % (ref 39.0–52.0)
Hemoglobin: 14.3 g/dL (ref 13.0–17.0)
Hemoglobin: 15.3 g/dL (ref 13.0–17.0)
MCH: 34.8 pg — ABNORMAL HIGH (ref 26.0–34.0)
MCH: 35.3 pg — ABNORMAL HIGH (ref 26.0–34.0)
MCHC: 32.6 g/dL (ref 30.0–36.0)
MCHC: 33.1 g/dL (ref 30.0–36.0)
MCV: 106.6 fL — ABNORMAL HIGH (ref 80.0–100.0)
MCV: 106.5 fL — ABNORMAL HIGH (ref 80.0–100.0)
Platelets: 191 K/uL (ref 150–400)
Platelets: 184 K/uL (ref 150–400)
RBC: 4.11 MIL/uL — ABNORMAL LOW (ref 4.22–5.81)
RBC: 4.34 MIL/uL (ref 4.22–5.81)
RDW: 13.4 % (ref 11.5–15.5)
RDW: 13.5 % (ref 11.5–15.5)
WBC: 8.8 K/uL (ref 4.0–10.5)
WBC: 9.5 K/uL (ref 4.0–10.5)
nRBC: 0 % (ref 0.0–0.2)
nRBC: 0 % (ref 0.0–0.2)

## 2023-09-18 LAB — I-STAT VENOUS BLOOD GAS, ED
Acid-Base Excess: 4 mmol/L — ABNORMAL HIGH (ref 0.0–2.0)
Bicarbonate: 31.6 mmol/L — ABNORMAL HIGH (ref 20.0–28.0)
Calcium, Ion: 1.18 mmol/L (ref 1.15–1.40)
HCT: 44 % (ref 39.0–52.0)
Hemoglobin: 15 g/dL (ref 13.0–17.0)
O2 Saturation: 75 %
Potassium: 3.5 mmol/L (ref 3.5–5.1)
Sodium: 144 mmol/L (ref 135–145)
TCO2: 33 mmol/L — ABNORMAL HIGH (ref 22–32)
pCO2, Ven: 58 mmHg (ref 44–60)
pH, Ven: 7.343 (ref 7.25–7.43)
pO2, Ven: 43 mmHg (ref 32–45)

## 2023-09-18 LAB — RESP PANEL BY RT-PCR (RSV, FLU A&B, COVID)  RVPGX2
Influenza A by PCR: NEGATIVE
Influenza B by PCR: NEGATIVE
Resp Syncytial Virus by PCR: NEGATIVE
SARS Coronavirus 2 by RT PCR: POSITIVE — AB

## 2023-09-18 LAB — BRAIN NATRIURETIC PEPTIDE: B Natriuretic Peptide: 244.4 pg/mL — ABNORMAL HIGH (ref 0.0–100.0)

## 2023-09-18 LAB — TROPONIN I (HIGH SENSITIVITY)
Troponin I (High Sensitivity): 33 ng/L — ABNORMAL HIGH (ref <18)
Troponin I (High Sensitivity): 35 ng/L — ABNORMAL HIGH (ref <18)

## 2023-09-18 LAB — CREATININE, SERUM
Creatinine, Ser: 2.01 mg/dL — ABNORMAL HIGH (ref 0.61–1.24)
GFR, Estimated: 32 mL/min — ABNORMAL LOW (ref >60)

## 2023-09-18 LAB — PROTIME-INR
INR: 1.2 (ref 0.8–1.2)
Prothrombin Time: 15.9 s — ABNORMAL HIGH (ref 11.4–15.2)

## 2023-09-18 MED ORDER — SODIUM CHLORIDE 0.9 % IV SOLN: 200.0000 mg | Freq: Once | INTRAVENOUS | Status: DC

## 2023-09-18 MED ORDER — ALBUTEROL SULFATE HFA 108 (90 BASE) MCG/ACT IN AERS
2.0000 | INHALATION_SPRAY | Freq: Once | RESPIRATORY_TRACT | Status: AC
  Administered 2023-09-18: 2 via RESPIRATORY_TRACT
  Filled 2023-09-18: qty 6.7

## 2023-09-18 MED ORDER — DEXAMETHASONE SODIUM PHOSPHATE 10 MG/ML IJ SOLN
6.0000 mg | Freq: Every day | INTRAMUSCULAR | Status: DC
  Administered 2023-09-18 – 2023-09-20 (×3): 6 mg via INTRAVENOUS
  Filled 2023-09-18 (×3): qty 0.6

## 2023-09-18 MED ORDER — ACETAMINOPHEN 650 MG RE SUPP: 650.0000 mg | Freq: Four times a day (QID) | RECTAL | Status: DC | PRN

## 2023-09-18 MED ORDER — AZITHROMYCIN 250 MG PO TABS
500.0000 mg | ORAL_TABLET | Freq: Every day | ORAL | Status: DC
  Administered 2023-09-19 – 2023-09-20 (×2): 500 mg via ORAL
  Filled 2023-09-18 (×2): qty 2

## 2023-09-18 MED ORDER — SODIUM CHLORIDE 0.9 % IV SOLN: 100.0000 mg | Freq: Every day | INTRAVENOUS | Status: DC

## 2023-09-18 MED ORDER — ONDANSETRON HCL 4 MG PO TABS: 4.0000 mg | ORAL_TABLET | Freq: Four times a day (QID) | ORAL | Status: DC | PRN

## 2023-09-18 MED ORDER — ACETAMINOPHEN 325 MG PO TABS: 650.0000 mg | ORAL_TABLET | Freq: Four times a day (QID) | ORAL | Status: DC | PRN

## 2023-09-18 MED ORDER — DEXAMETHASONE SODIUM PHOSPHATE 10 MG/ML IJ SOLN
10.0000 mg | Freq: Once | INTRAMUSCULAR | Status: AC
  Administered 2023-09-18: 10 mg via INTRAVENOUS
  Filled 2023-09-18: qty 1

## 2023-09-18 MED ORDER — ENOXAPARIN SODIUM 40 MG/0.4ML IJ SOSY
40.0000 mg | PREFILLED_SYRINGE | INTRAMUSCULAR | Status: DC
  Administered 2023-09-18: 40 mg via SUBCUTANEOUS
  Filled 2023-09-18: qty 0.4

## 2023-09-18 MED ORDER — SODIUM CHLORIDE 0.9 % IV SOLN
500.0000 mg | INTRAVENOUS | Status: AC
  Administered 2023-09-18: 500 mg via INTRAVENOUS
  Filled 2023-09-18: qty 5

## 2023-09-18 MED ORDER — ONDANSETRON HCL 4 MG/2ML IJ SOLN: 4.0000 mg | Freq: Four times a day (QID) | INTRAMUSCULAR | Status: DC | PRN

## 2023-09-18 MED ORDER — FOLIC ACID 1 MG PO TABS
1.0000 mg | ORAL_TABLET | Freq: Every day | ORAL | Status: DC
  Administered 2023-09-18 – 2023-09-20 (×3): 1 mg via ORAL
  Filled 2023-09-18 (×3): qty 1

## 2023-09-18 NOTE — ED Triage Notes (Signed)
 Pt via EMS from home c/o SOB, cough, dizziness on exertion since yesterday. Baseline nighttime O2 requirement currently needing supplemental O2 with activity. O2 sat reported to be 82% on room air PTA. Pt is warm to touch with recent exposure to covid. A/O x 4  O2 90% 2L

## 2023-09-18 NOTE — H&P (Signed)
 History and Physical    Patient: Jason Gates. FMW:[id number] DOB: May 31, 1940 DOA: 09/18/2023 DOS: the patient was seen and examined on 09/18/2023 PCP: Franchot Houston, PA-C  Patient coming from: Home  Chief Complaint:  Chief Complaint  Patient presents with   Shortness of Breath   Cough   HPI: Jason Gates. is a 83 y.o. male with medical history significant of coronary artery disease, bladder cancer, paroxysmal atrial fibrillation, hyperlipidemia, obstructive sleep apnea, GERD, history of CVA, essential hypertension, coronary artery disease status post PCI, chronic kidney disease stage III who presented to the ER with shortness of breath and cough.  Patient also having dyspnea on exertion and dizziness.  His oxygen started history of 14 to 82% on room air prior to arrival.  He is requiring 2 L of oxygen to keep his oxygen sat above 90s.  In the ER however he did not seem to require oxygen.  His workup shows that patient has COVID-19 infection.  Chest x-ray showed basilar infiltrates.  Patient has been admitted there for with symptomatic COVID-19 infection.  Review of Systems: As mentioned in the history of present illness. All other systems reviewed and are negative. Past Medical History:  Diagnosis Date   Bladder cancer (HCC)    CAD (coronary artery disease)    a. 2006 s/p PCI of LAD and D1; b. 07/2009 Cath: Patent LAD stent, occluded D1->Med rx; c. 01/2013 Cath: LM nl, LAD 20p, patent stent, D1 70-80p ISR, D2 nl, LCX 10, RCA nl, EF 60-65%; d. 10/2021 MV: fixed apical/apical lateral defect->artifact though cannot r/o scar. No ischemia. EF >65%.   CKD (chronic kidney disease), stage III (HCC)    Diastolic dysfunction    a. 09/2022 Echo: EF 55-60%, no rwma, GrI DD, nl RV fxn, no significant valvular dzs.   GERD (gastroesophageal reflux disease)    Hyperlipidemia LDL goal <70    OSA (obstructive sleep apnea)    PAF (paroxysmal atrial fibrillation) (HCC)    a. 10/2022 Zio:  predominantly sinus rhythm, avg 66 (39-122). 6% afib burden, longest 1h 81m @ 102. Freq PACs (22.4%), freq PVCs (5.1%). 12 runs NSVT up to 12 beats @ 167. No prolonged pauses. Triggered events = RSR, PACs, PVCs; b. CHA2DS2VASc = 6-->eliquis  2.5 bid (age/creat).   Primary hypertension    PSVT (paroxysmal supraventricular tachycardia) (HCC)    Right upper lobe pulmonary nodule    Stroke [facility])    Past Surgical History:  Procedure Laterality Date   CARDIAC SURGERY     CHOLECYSTECTOMY     COLONOSCOPY WITH PROPOFOL  N/A 04/21/2022   Procedure: COLONOSCOPY WITH PROPOFOL ;  Surgeon: Maryruth Ole DASEN, MD;  Location: [facility] ENDOSCOPY;  Service: Endoscopy;  Laterality: N/A;   CORONARY ANGIOPLASTY     two stents in 2007   ESOPHAGOGASTRODUODENOSCOPY (EGD) WITH PROPOFOL  N/A 04/21/2022   Procedure: ESOPHAGOGASTRODUODENOSCOPY (EGD) WITH PROPOFOL ;  Surgeon: Maryruth Ole DASEN, MD;  Location: [facility] ENDOSCOPY;  Service: Endoscopy;  Laterality: N/A;   EYE SURGERY     NASAL SEPTUM SURGERY     Social History:  reports that he quit smoking about 49 years ago. His smoking use included cigarettes. He started smoking about 64 years ago. He has a 15 pack-year smoking history. He does not have any smokeless tobacco history on file. He reports that he does not currently use alcohol . He reports that he does not use drugs.  Allergies  Allergen Reactions   K Phos Mono-Sod Phos Di & Mono Diarrhea   Sulfur  Dioxide Hives   Darunavir     Other Reaction(s): Urticaria, Eruption of skin, Urticaria, Eruption of skin   Elemental Sulfur Other (See Comments)    intolerance   Phosphorus Other (See Comments)    intolerance   Potassium Nausea And Vomiting and Other (See Comments)   Potassium Phosphate     Other reaction(s): Diarrhea   Amoxicillin Rash and Hives    Family History  Problem Relation Age of Onset   Obesity Mother    Diabetes Father    Heart attack Father    Heart disease Father     Prior to Admission  medications   Medication Sig Start Date End Date Taking? Authorizing Provider  alfuzosin  (UROXATRAL ) 10 MG 24 hr tablet Take 1 tablet (10 mg total) by mouth daily with breakfast. 06/10/23   Penne Knee, MD  amLODipine  (NORVASC ) 5 MG tablet Take 5 mg by mouth at bedtime. 04/26/15   [provider]  aspirin  EC 81 MG tablet Take 81 mg by mouth daily. Swallow whole.    [provider]  azithromycin  (ZITHROMAX ) 250 MG tablet Take 250 mg by mouth daily. Patient not taking: Reported on 09/16/2023    [provider]  Cholecalciferol  (VITAMIN D ) 125 MCG (5000 UT) CAPS Take 5,000 Units by mouth daily.    [provider]  cycloSPORINE , PF, 0.09 % SOLN Apply 1 drop to eye 2 (two) times daily. 07/07/23   [provider]  Dupilumab 300 MG/2ML SOAJ Inject 300 mg into the skin. 08/27/23   [provider]  ELIQUIS  2.5 MG TABS tablet TAKE 1 TABLET TWICE A DAY 08/04/22   End, Lonni, MD  guaifenesin  (HUMIBID E) 400 MG TABS tablet Take 400 mg by mouth 4 (four) times daily as needed. 07/07/23   [provider]  nitroGLYCERIN  (NITROSTAT ) 0.4 MG SL tablet Place 1 tablet (0.4 mg total) under the tongue every 5 (five) minutes as needed for chest pain. 07/31/23   Vivienne Lonni Ingle, NP  Nutritional Supplements (BLADDER 2.2 PO) Take 1 tablet by mouth daily.    [provider]  pantoprazole  (PROTONIX ) 40 MG tablet Take 1 tablet (40 mg total) by mouth at bedtime. 01/23/22   End, Lonni, MD  predniSONE  (DELTASONE ) 5 MG tablet Take 1 tablet by mouth daily. Patient not taking: Reported on 09/16/2023 07/07/23   [provider]  sildenafil  (REVATIO ) 20 MG tablet Take 1 to 5 tablets daily as needed 1 hour prior to intercourse 09/24/22   Penne Knee, MD  simvastatin  (ZOCOR ) 20 MG tablet Take 20 mg by mouth daily.    [provider]  torsemide  (DEMADEX ) 20 MG tablet Take 1 tablet (20 mg total) by mouth daily. 08/25/23   End,  Lonni, MD  VEVYE  0.1 % SOLN Apply to eye. 01/01/23   [provider]    Physical Exam: Vitals:   09/18/23 1840 09/18/23 2000 09/18/23 2010 09/18/23 2013  BP: (!) 143/65 139/67    Pulse: 66 86 73   Resp: (!) 31 (!) 27 18   Temp:    (!) 100.8 F (38.2 C)  TempSrc:    Oral  SpO2: 95% 93% 97%   Weight:      Height:       Constitutional: Obese, acutely ill looking, NAD, calm, comfortable Eyes: PERRL, lids and conjunctivae normal ENMT: Mucous membranes are dry. Posterior pharynx clear of any exudate or lesions.Normal dentition.  Neck: normal, supple, no masses, no thyromegaly Respiratory: Coarse breath sounds bilaterally, no wheezing,  no crackles. Normal respiratory effort. No accessory muscle use.  Cardiovascular: Regular rate and rhythm, no murmurs / rubs / gallops. No extremity edema. 2+ pedal pulses. No carotid bruits.  Abdomen: no tenderness, no masses palpated. No hepatosplenomegaly. Bowel sounds positive.  Musculoskeletal: Good range of motion, no joint swelling or tenderness, Skin: no rashes, lesions, ulcers. No induration Neurologic: CN 2-12 grossly intact. Sensation intact, DTR normal. Strength 5/5 in all 4.  Psychiatric: Normal judgment and insight. Alert and oriented x 3. Normal mood  Data Reviewed:  Temperature 100.8, respiratory 42 oxygen sat 90% on room air.  Creatinine 2.01 troponin 35 glucose 107 BNP 244.  Acute viral screening positive for COVID-19 infection.  Chest x-ray showed bilateral lung base opacities no new nodules.  Assessment and Plan:   #1 symptomatic COVID-19 infection: Probably early pneumonia.  Patient has had exertional hypoxia.  Patient however is not requiring oxygen at rest.  We will admit for symptomatic management.  Initiate Decadron .  Hold off on remdesivir since patient is not requiring oxygen.  Continue with albuterol  MDI.  Azithromycin  will be added for possible early secondary pneumonia.  Other supportive care including  multivitamins.  #2 coronary artery disease: No acute exacerbation.  Continue to monitor  #3 essential hypertension: Continue home blood pressure medications.  #4 GERD: Continue with PPIs  #5 obstructive sleep apnea: CPAP at night  #6 history of heart failure with preserved ejection fraction: Appears compensated.  Continue to monitor  #7 paroxysmal atrial fibrillation: Rate is controlled.  Continue home regimen  #8 hyperlipidemia: Continue statin    Advance Care Planning:   Code Status: Full Code   Consults: None  Family Communication: Wife at bedside  Severity of Illness: The appropriate patient status for this patient is INPATIENT. Inpatient status is judged to be reasonable and necessary in order to provide the required intensity of service to ensure the patient's safety. The patient's presenting symptoms, physical exam findings, and initial radiographic and laboratory data in the context of their chronic comorbidities is felt to place them at high risk for further clinical deterioration. Furthermore, it is not anticipated that the patient will be medically stable for discharge from the hospital within 2 midnights of admission.   * I certify that at the point of admission it is my clinical judgment that the patient will require inpatient hospital care spanning beyond 2 midnights from the point of admission due to high intensity of service, high risk for further deterioration and high frequency of surveillance required.*  AuthorBETHA SIM KNOLL, MD 09/18/2023 8:16 PM  For on call review [email or web address].

## 2023-09-18 NOTE — ED Notes (Signed)
 Patients family stated patient has sleep apnea and wears CPAP and oxygen at night.

## 2023-09-18 NOTE — ED Provider Notes (Signed)
 [place] EMERGENCY DEPARTMENT AT [facility] Provider Note   CSN: [id number] Arrival date & time: 09/18/23  1620     Patient presents with: Shortness of Breath and Cough   Jason RAMAN Yecheskel Kurek. is a 83 y.o. male history of hypertension, A-fib on Eliquis , here presenting with shortness of breath and hypoxia.  Patient states that he lives at home and felt very short of breath yesterday.  He also had some productive cough with yellowish sputum.  He states that he has a hard time sleeping due to shortness of breath.  He walks to the bathroom today and noticed that he got very short of breath and took his oxygen level and was 82%.  Multiple family members had URI symptoms and one of them tested positive for COVID.  Patient has a history of diastolic heart failure but no COPD.    The history is provided by the patient.       Prior to Admission medications   Medication Sig Start Date End Date Taking? Authorizing Provider  alfuzosin  (UROXATRAL ) 10 MG 24 hr tablet Take 1 tablet (10 mg total) by mouth daily with breakfast. 06/10/23   Penne Knee, MD  amLODipine  (NORVASC ) 5 MG tablet Take 5 mg by mouth at bedtime. 04/26/15   [provider]  aspirin  EC 81 MG tablet Take 81 mg by mouth daily. Swallow whole.    [provider]  azithromycin  (ZITHROMAX ) 250 MG tablet Take 250 mg by mouth daily. Patient not taking: Reported on 09/16/2023    [provider]  Cholecalciferol  (VITAMIN D ) 125 MCG (5000 UT) CAPS Take 5,000 Units by mouth daily.    [provider]  cycloSPORINE , PF, 0.09 % SOLN Apply 1 drop to eye 2 (two) times daily. 07/07/23   [provider]  Dupilumab 300 MG/2ML SOAJ Inject 300 mg into the skin. 08/27/23   [provider]  ELIQUIS  2.5 MG TABS tablet TAKE 1 TABLET TWICE A DAY 08/04/22   End, Lonni, MD  guaifenesin  (HUMIBID E) 400 MG TABS tablet Take 400 mg by mouth 4 (four) times daily as needed. 07/07/23   [provider]  nitroGLYCERIN  (NITROSTAT ) 0.4 MG SL tablet Place 1 tablet (0.4 mg total) under the tongue every 5 (five) minutes as needed for chest pain. 07/31/23   Vivienne Lonni Ingle, NP  Nutritional Supplements (BLADDER 2.2 PO) Take 1 tablet by mouth daily.    [provider]  pantoprazole  (PROTONIX ) 40 MG tablet Take 1 tablet (40 mg total) by mouth at bedtime. 01/23/22   End, Lonni, MD  predniSONE  (DELTASONE ) 5 MG tablet Take 1 tablet by mouth daily. Patient not taking: Reported on 09/16/2023 07/07/23   [provider]  sildenafil  (REVATIO ) 20 MG tablet Take 1 to 5 tablets daily as needed 1 hour prior to intercourse 09/24/22   Penne Knee, MD  simvastatin  (ZOCOR ) 20 MG tablet Take 20 mg by mouth daily.    [provider]  torsemide  (DEMADEX ) 20 MG tablet Take 1 tablet (20 mg total) by mouth daily. 08/25/23   End, Lonni, MD  VEVYE  0.1 % SOLN Apply to eye. 01/01/23   [provider]    Allergies: K phos mono-sod phos di & mono, Sulfur dioxide, Darunavir, Elemental sulfur, Phosphorus, Potassium, Potassium phosphate, and Amoxicillin    Review of Systems  Respiratory:  Positive for cough and shortness of breath.   All other systems reviewed and are negative.   Updated Vital Signs BP (!) 158/65  Pulse 64   Temp 99.3 F (37.4 C) (Oral)   Resp 18   Ht 5' 8 (1.727 m)   Wt 105.2 kg   SpO2 90%   BMI 35.28 kg/m   Physical Exam Vitals and nursing note reviewed.  Constitutional:      Comments: Tachypneic and chronically ill  HENT:     Head: Normocephalic.     Mouth/Throat:     Mouth: Mucous membranes are moist.  Eyes:     Extraocular Movements: Extraocular movements intact.     Pupils: Pupils are equal, round, and reactive to light.  Cardiovascular:     Rate and Rhythm: Normal rate and regular rhythm.  Pulmonary:     Comments: Tachypneic and diminished bilaterally.  No obvious wheezing Musculoskeletal:        General: Normal  range of motion.     Cervical back: Normal range of motion and neck supple.  Skin:    General: Skin is warm.     Capillary Refill: Capillary refill takes less than 2 seconds.  Neurological:     General: No focal deficit present.     Mental Status: He is oriented to person, place, and time.  Psychiatric:        Mood and Affect: Mood normal.        Behavior: Behavior normal.     (all labs ordered are listed, but only abnormal results are displayed) Labs Reviewed  RESP PANEL BY RT-PCR (RSV, FLU A&B, COVID)  [id number]  BASIC METABOLIC PANEL WITH GFR  CBC  PROTIME-INR  BRAIN NATRIURETIC PEPTIDE  I-STAT VENOUS BLOOD GAS, ED  TROPONIN I (HIGH SENSITIVITY)    EKG: None  Radiology: No results found.   Procedures   CRITICAL CARE Performed by: Jason Gates   Total critical care time: 39 minutes  Critical care time was exclusive of separately billable procedures and treating other patients.  Critical care was necessary to treat or prevent imminent or life-threatening deterioration.  Critical care was time spent personally by me on the following activities: development of treatment plan with patient and/or surrogate as well as nursing, discussions with consultants, evaluation of patient's response to treatment, examination of patient, obtaining history from patient or surrogate, ordering and performing treatments and interventions, ordering and review of laboratory studies, ordering and review of radiographic studies, pulse oximetry and re-evaluation of patient's condition.   Medications Ordered in the ED  albuterol  (VENTOLIN  HFA) 108 (90 Base) MCG/ACT inhaler 2 puff (has no administration in time range)                                    Medical Decision Making Jason Godbee. is a 83 y.o. male history of A-fib on Eliquis , diastolic heart failure, here presenting with shortness of breath.  Patient also has a COVID exposure.  Consider COVID versus pneumonia versus heart  failure exacerbation.  Plan to get CBC and BMP and VBG and chest x-ray and COVID and flu and RSV test.  Given patient is hypoxic, patient will likely need admission  7:01 PM Reviewed patient's labs and VBG show pH is 7.3 and COVID test is positive.  Patient's creatinine is stable at 2.  BNP is 240.  Chest x-ray showed no pneumonia.  Since patient is hypoxic and desats to 82% with ambulation, will admit for hypoxia from COVID.  I ordered Decadron and albuterol .  Problems Addressed: Chronic kidney  disease, unspecified CKD stage: chronic illness or injury COVID: acute illness or injury Hypoxia: acute illness or injury  Amount and/or Complexity of Data Reviewed Labs: ordered. Decision-making details documented in ED Course. Radiology: ordered and independent interpretation performed. Decision-making details documented in ED Course.  Risk Prescription drug management. Decision regarding hospitalization.     Final diagnoses:  None    ED Discharge Orders     None          Patt Jason Macho, MD 09/18/23 RETHA

## 2023-09-18 NOTE — H&P (Incomplete)
 History and Physical    Patient: Jason Gates. FMW:[id number] DOB: 11/04/1940 DOA: 09/18/2023 DOS: the patient was seen and examined on 09/18/2023 PCP: Franchot Houston, PA-C  Patient coming from: Home  Chief Complaint:  Chief Complaint  Patient presents with  . Shortness of Breath  . Cough   HPI: Jason Gates. is a 83 y.o. male with medical history significant of coronary artery disease, bladder cancer, paroxysmal atrial fibrillation, hyperlipidemia, obstructive sleep apnea, GERD, history of CVA, essential hypertension, coronary artery disease status post PCI, chronic kidney disease stage III who presented to the ER with shortness of breath and cough.  Patient also having dyspnea on exertion and dizziness.  His oxygen started history of 14 to 82% on room air prior to arrival.  He is requiring 2 L of oxygen to keep his oxygen sat above 90s.  In the ER however he did not seem to require oxygen.  His workup shows that patient has COVID-19 infection.  Chest x-ray showed basilar infiltrates.  Patient has been admitted there for with symptomatic COVID-19 infection.  Review of Systems: As mentioned in the history of present illness. All other systems reviewed and are negative. Past Medical History:  Diagnosis Date  . Bladder cancer (HCC)   . CAD (coronary artery disease)    a. 2006 s/p PCI of LAD and D1; b. 07/2009 Cath: Patent LAD stent, occluded D1->Med rx; c. 01/2013 Cath: LM nl, LAD 20p, patent stent, D1 70-80p ISR, D2 nl, LCX 10, RCA nl, EF 60-65%; d. 10/2021 MV: fixed apical/apical lateral defect->artifact though cannot r/o scar. No ischemia. EF >65%.  . CKD (chronic kidney disease), stage III (HCC)   . Diastolic dysfunction    a. 09/2022 Echo: EF 55-60%, no rwma, GrI DD, nl RV fxn, no significant valvular dzs.  SABRA GERD (gastroesophageal reflux disease)   . Hyperlipidemia LDL goal <70   . OSA (obstructive sleep apnea)   . PAF (paroxysmal atrial fibrillation) (HCC)    a. 10/2022  Zio: predominantly sinus rhythm, avg 66 (39-122). 6% afib burden, longest 1h 37m @ 102. Freq PACs (22.4%), freq PVCs (5.1%). 12 runs NSVT up to 12 beats @ 167. No prolonged pauses. Triggered events = RSR, PACs, PVCs; b. CHA2DS2VASc = 6-->eliquis  2.5 bid (age/creat).  . Primary hypertension   . PSVT (paroxysmal supraventricular tachycardia) (HCC)   . Right upper lobe pulmonary nodule   . Stroke [facility])    Past Surgical History:  Procedure Laterality Date  . CARDIAC SURGERY    . CHOLECYSTECTOMY    . COLONOSCOPY WITH PROPOFOL  N/A 04/21/2022   Procedure: COLONOSCOPY WITH PROPOFOL ;  Surgeon: Maryruth Ole DASEN, MD;  Location: [facility] ENDOSCOPY;  Service: Endoscopy;  Laterality: N/A;  . CORONARY ANGIOPLASTY     two stents in 2007  . ESOPHAGOGASTRODUODENOSCOPY (EGD) WITH PROPOFOL  N/A 04/21/2022   Procedure: ESOPHAGOGASTRODUODENOSCOPY (EGD) WITH PROPOFOL ;  Surgeon: Maryruth Ole DASEN, MD;  Location: [facility] ENDOSCOPY;  Service: Endoscopy;  Laterality: N/A;  . EYE SURGERY    . NASAL SEPTUM SURGERY     Social History:  reports that he quit smoking about 49 years ago. His smoking use included cigarettes. He started smoking about 64 years ago. He has a 15 pack-year smoking history. He does not have any smokeless tobacco history on file. He reports that he does not currently use alcohol . He reports that he does not use drugs.  Allergies  Allergen Reactions  . K Phos Mono-Sod Phos Di & Mono Diarrhea  . Sulfur  Dioxide Hives  . Darunavir     Other Reaction(s): Urticaria, Eruption of skin, Urticaria, Eruption of skin  . Elemental Sulfur Other (See Comments)    intolerance  . Phosphorus Other (See Comments)    intolerance  . Potassium Nausea And Vomiting and Other (See Comments)  . Potassium Phosphate     Other reaction(s): Diarrhea  . Amoxicillin Rash and Hives    Family History  Problem Relation Age of Onset  . Obesity Mother   . Diabetes Father   . Heart attack Father   . Heart disease Father      Prior to Admission medications   Medication Sig Start Date End Date Taking? Authorizing Provider  alfuzosin  (UROXATRAL ) 10 MG 24 hr tablet Take 1 tablet (10 mg total) by mouth daily with breakfast. 06/10/23   Penne Knee, MD  amLODipine  (NORVASC ) 5 MG tablet Take 5 mg by mouth at bedtime. 04/26/15   [provider]  aspirin  EC 81 MG tablet Take 81 mg by mouth daily. Swallow whole.    [provider]  azithromycin  (ZITHROMAX ) 250 MG tablet Take 250 mg by mouth daily. Patient not taking: Reported on 09/16/2023    [provider]  Cholecalciferol  (VITAMIN D ) 125 MCG (5000 UT) CAPS Take 5,000 Units by mouth daily.    [provider]  cycloSPORINE , PF, 0.09 % SOLN Apply 1 drop to eye 2 (two) times daily. 07/07/23   [provider]  Dupilumab 300 MG/2ML SOAJ Inject 300 mg into the skin. 08/27/23   [provider]  ELIQUIS  2.5 MG TABS tablet TAKE 1 TABLET TWICE A DAY 08/04/22   End, Lonni, MD  guaifenesin  (HUMIBID E) 400 MG TABS tablet Take 400 mg by mouth 4 (four) times daily as needed. 07/07/23   [provider]  nitroGLYCERIN  (NITROSTAT ) 0.4 MG SL tablet Place 1 tablet (0.4 mg total) under the tongue every 5 (five) minutes as needed for chest pain. 07/31/23   Vivienne Lonni Ingle, NP  Nutritional Supplements (BLADDER 2.2 PO) Take 1 tablet by mouth daily.    [provider]  pantoprazole  (PROTONIX ) 40 MG tablet Take 1 tablet (40 mg total) by mouth at bedtime. 01/23/22   End, Lonni, MD  predniSONE  (DELTASONE ) 5 MG tablet Take 1 tablet by mouth daily. Patient not taking: Reported on 09/16/2023 07/07/23   [provider]  sildenafil  (REVATIO ) 20 MG tablet Take 1 to 5 tablets daily as needed 1 hour prior to intercourse 09/24/22   Penne Knee, MD  simvastatin  (ZOCOR ) 20 MG tablet Take 20 mg by mouth daily.    [provider]  torsemide  (DEMADEX ) 20 MG tablet Take 1 tablet (20 mg total) by mouth daily.  08/25/23   End, Lonni, MD  VEVYE  0.1 % SOLN Apply to eye. 01/01/23   [provider]    Physical Exam: Vitals:   09/18/23 1840 09/18/23 2000 09/18/23 2010 09/18/23 2013  BP: (!) 143/65 139/67    Pulse: 66 86 73   Resp: (!) 31 (!) 27 18   Temp:    (!) 100.8 F (38.2 C)  TempSrc:    Oral  SpO2: 95% 93% 97%   Weight:      Height:       Constitutional: Obese, acutely ill looking, NAD, calm, comfortable Eyes: PERRL, lids and conjunctivae normal ENMT: Mucous membranes are dry. Posterior pharynx clear of any exudate or lesions.Normal dentition.  Neck: normal, supple, no masses, no thyromegaly Respiratory: Coarse breath sounds bilaterally, no wheezing,  no crackles. Normal respiratory effort. No accessory muscle use.  Cardiovascular: Regular rate and rhythm, no murmurs / rubs / gallops. No extremity edema. 2+ pedal pulses. No carotid bruits.  Abdomen: no tenderness, no masses palpated. No hepatosplenomegaly. Bowel sounds positive.  Musculoskeletal: Good range of motion, no joint swelling or tenderness, Skin: no rashes, lesions, ulcers. No induration Neurologic: CN 2-12 grossly intact. Sensation intact, DTR normal. Strength 5/5 in all 4.  Psychiatric: Normal judgment and insight. Alert and oriented x 3. Normal mood  Data Reviewed:  Temperature 100.8, respiratory 42 oxygen sat 90% on room air.  Creatinine 2.01 troponin 35 glucose 107 BNP 244.  Acute viral screening positive for COVID-19 infection.  Chest x-ray showed bilateral lung base opacities no new nodules.  Assessment and Plan:   #1 symptomatic COVID-19 infection:    Advance Care Planning:   Code Status: Full Code ***  Consults: ***  Family Communication: ***  Severity of Illness: {Observation/Inpatient:21159}  AuthorBETHA SIM KNOLL, MD 09/18/2023 8:16 PM  For on call review [email or web address].

## 2023-09-19 DIAGNOSIS — U071 COVID-19: Secondary | ICD-10-CM | POA: Diagnosis not present

## 2023-09-19 LAB — COMPREHENSIVE METABOLIC PANEL WITH GFR
ALT: 23 U/L (ref 0–44)
AST: 32 U/L (ref 15–41)
Albumin: 3.3 g/dL — ABNORMAL LOW (ref 3.5–5.0)
Alkaline Phosphatase: 44 U/L (ref 38–126)
Anion gap: 9 (ref 5–15)
BUN: 20 mg/dL (ref 8–23)
CO2: 28 mmol/L (ref 22–32)
Calcium: 8.9 mg/dL (ref 8.9–10.3)
Chloride: 105 mmol/L (ref 98–111)
Creatinine, Ser: 1.8 mg/dL — ABNORMAL HIGH (ref 0.61–1.24)
GFR, Estimated: 37 mL/min — ABNORMAL LOW (ref >60)
Glucose, Bld: 167 mg/dL — ABNORMAL HIGH (ref 70–99)
Potassium: 3.9 mmol/L (ref 3.5–5.1)
Sodium: 142 mmol/L (ref 135–145)
Total Bilirubin: 0.8 mg/dL (ref 0.0–1.2)
Total Protein: 6.2 g/dL — ABNORMAL LOW (ref 6.5–8.1)

## 2023-09-19 LAB — CBC
HCT: 43.4 % (ref 39.0–52.0)
Hemoglobin: 14.6 g/dL (ref 13.0–17.0)
MCH: 35.2 pg — ABNORMAL HIGH (ref 26.0–34.0)
MCHC: 33.6 g/dL (ref 30.0–36.0)
MCV: 104.6 fL — ABNORMAL HIGH (ref 80.0–100.0)
Platelets: 178 K/uL (ref 150–400)
RBC: 4.15 MIL/uL — ABNORMAL LOW (ref 4.22–5.81)
RDW: 13.2 % (ref 11.5–15.5)
WBC: 5 K/uL (ref 4.0–10.5)
nRBC: 0 % (ref 0.0–0.2)

## 2023-09-19 MED ORDER — SIMVASTATIN 20 MG PO TABS
20.0000 mg | ORAL_TABLET | Freq: Every day | ORAL | Status: DC
  Administered 2023-09-19 – 2023-09-20 (×2): 20 mg via ORAL
  Filled 2023-09-19 (×2): qty 1

## 2023-09-19 MED ORDER — AMLODIPINE BESYLATE 5 MG PO TABS
5.0000 mg | ORAL_TABLET | Freq: Every day | ORAL | Status: DC
  Administered 2023-09-19 – 2023-09-20 (×2): 5 mg via ORAL
  Filled 2023-09-19 (×2): qty 1

## 2023-09-19 MED ORDER — NITROGLYCERIN 0.4 MG SL SUBL: 0.4000 mg | SUBLINGUAL_TABLET | SUBLINGUAL | Status: DC | PRN

## 2023-09-19 MED ORDER — TORSEMIDE 20 MG PO TABS
20.0000 mg | ORAL_TABLET | Freq: Every day | ORAL | Status: DC
  Administered 2023-09-19 – 2023-09-20 (×2): 20 mg via ORAL
  Filled 2023-09-19 (×2): qty 1

## 2023-09-19 MED ORDER — GUAIFENESIN ER 600 MG PO TB12
600.0000 mg | ORAL_TABLET | Freq: Two times a day (BID) | ORAL | Status: DC
  Administered 2023-09-19 – 2023-09-20 (×3): 600 mg via ORAL
  Filled 2023-09-19 (×3): qty 1

## 2023-09-19 MED ORDER — ASPIRIN 81 MG PO TBEC
81.0000 mg | DELAYED_RELEASE_TABLET | Freq: Every day | ORAL | Status: DC
  Administered 2023-09-19 – 2023-09-20 (×2): 81 mg via ORAL
  Filled 2023-09-19 (×2): qty 1

## 2023-09-19 MED ORDER — APIXABAN 2.5 MG PO TABS
2.5000 mg | ORAL_TABLET | Freq: Two times a day (BID) | ORAL | Status: DC
  Administered 2023-09-19 – 2023-09-20 (×4): 2.5 mg via ORAL
  Filled 2023-09-19 (×4): qty 1

## 2023-09-19 MED ORDER — METOPROLOL TARTRATE 12.5 MG HALF TABLET: 12.5000 mg | ORAL_TABLET | Freq: Two times a day (BID) | ORAL | Status: DC

## 2023-09-19 NOTE — Progress Notes (Signed)
 PROGRESS NOTE    Jason Gates.  FMW:[id number] DOB: 12-26-1940 DOA: 09/18/2023 PCP: Franchot Houston, PA-C   Brief Narrative: 83 year old with past medical history significant for CAD, bladder cancer, paroxysmal A-fib, hyperlipidemia, obstructive sleep apnea, GERD, history of CVA, essential hypertension, CAD status post PCI, CKD stage III presented to the ER with shortness of breath and cough.  Also reports dyspnea on exertion and dizziness.  His oxygen saturation at home was 82%.  Workup revealed COVID-19 positive.  Chest x-ray showed bibasilar infiltrates.     Assessment & Plan:   Principal Problem:   COVID-19 Active Problems:   SOB (shortness of breath)   Dizziness   Acute respiratory failure with hypoxia (HCC)   Coronary artery disease involving native coronary artery of native heart without angina pectoris   Paroxysmal atrial fibrillation (HCC)   Hyperlipidemia LDL goal <70   Essential hypertension   Gastroesophageal reflux disease   Obstructive sleep apnea syndrome   (HFpEF) heart failure with preserved ejection fraction (HCC)  1-Acute on chronic hypoxic respiratory failure in the setting of COVID-19 pneumonia -admitted with hypoxia, dyspnea, cough.  -Covid Positive.  -Continue with oxygen supplementation and IV steroids Decadron  6 mg IV daily.  -continue with Azithromycin .   CAD -Continue with aspirin , Zocor , Nitroglycerin  PRN  CKD Stage IIIb: Creatinine baseline 1.6----1.9 Stable today monitor on Torsemide .   Essential hypertension -Continue with Torsemide .   GERD - Continue with PPI  Obstructive sleep apnea: Resume CPAP at night  Tachy-bradycardia Syndrome.  Paroxysmal A-fib Continue with Eliquis  Not on BB due to bradycardia. Monitor HR.  He follows with Dr. Cindie, antiarrhythmic has been avoided given they may lead to pacemaker therapy being required. Dr. Cindie was recommending conservative management strategy If patient develops sustained an  symptomatic A-fib might need cardiology evaluation at that point  Hyperlipidemia: Continue with the statins  Chronic diastolic heart failure: - Continue with torsemide   MCV elevated. Check B 12.   Estimated body mass index is 35.28 kg/m as calculated from the following:   Height as of this encounter: 5' 8 (1.727 m).   Weight as of this encounter: 105.2 kg.   DVT prophylaxis: Eliquis  Code Status: Full code Family Communication: Son who was at bedside Disposition Plan:  Status is: Inpatient Remains inpatient appropriate because: management of Covid    Consultants:  none  Procedures:  none  Antimicrobials:    Subjective: He is laert, report feeling better, denies worsening dyspnea, report some cough but thick phlegm.    Objective: Vitals:   09/18/23 2013 09/18/23 2030 09/18/23 2115 09/19/23 0419  BP:  (!) 147/72 (!) 143/95 109/76  Pulse:  85 89 72  Resp:  19 19 18   Temp: (!) 100.8 F (38.2 C)  99.5 F (37.5 C) 98 F (36.7 C)  TempSrc: Oral  Oral   SpO2:  94% 97% 93%  Weight:      Height:        Intake/Output Summary (Last 24 hours) at 09/19/2023 0751 Last data filed at 09/19/2023 0000 Gross per 24 hour  Intake 240 ml  Output --  Net 240 ml   Filed Weights   09/18/23 1630  Weight: 105.2 kg    Examination:  General exam: Appears calm and comfortable  Respiratory system: Clear to auscultation. Respiratory effort normal. Cardiovascular system: S1 & S2 heard, IRR. Gastrointestinal system: Abdomen is nondistended, soft and nontender. No organomegaly or masses felt. Normal bowel sounds heard. Central nervous system: Alert and oriented. Extremities: Symmetric 5  x 5 power.    Data Reviewed: I have personally reviewed following labs and imaging studies  CBC: Recent Labs  Lab 09/18/23 1633 09/18/23 1735 09/18/23 2156 09/19/23 0524  WBC 8.8  --  9.5 5.0  HGB 14.3 15.0 15.3 14.6  HCT 43.8 44.0 46.2 43.4  MCV 106.6*  --  106.5* 104.6*  PLT 191   --  184 178   Basic Metabolic Panel: Recent Labs  Lab 09/18/23 1633 09/18/23 1735 09/18/23 2156 09/19/23 0524  NA 142 144  --  142  K 3.5 3.5  --  3.9  CL 103  --   --  105  CO2 29  --   --  28  GLUCOSE 107*  --   --  167*  BUN 20  --   --  20  CREATININE 2.01*  --  2.01* 1.80*  CALCIUM 9.1  --   --  8.9   GFR: Estimated Creatinine Clearance: 35.9 mL/min (A) (by C-G formula based on SCr of 1.8 mg/dL (H)). Liver Function Tests: Recent Labs  Lab 09/19/23 0524  AST 32  ALT 23  ALKPHOS 44  BILITOT 0.8  PROT 6.2*  ALBUMIN 3.3*   No results for input(s): LIPASE, AMYLASE in the last 168 hours. No results for input(s): AMMONIA in the last 168 hours. Coagulation Profile: Recent Labs  Lab 09/18/23 1633  INR 1.2   Cardiac Enzymes: No results for input(s): CKTOTAL, CKMB, CKMBINDEX, TROPONINI in the last 168 hours. BNP (last 3 results) No results for input(s): PROBNP in the last 8760 hours. HbA1C: No results for input(s): HGBA1C in the last 72 hours. CBG: No results for input(s): GLUCAP in the last 168 hours. Lipid Profile: No results for input(s): CHOL, HDL, LDLCALC, TRIG, CHOLHDL, LDLDIRECT in the last 72 hours. Thyroid Function Tests: No results for input(s): TSH, T4TOTAL, FREET4, T3FREE, THYROIDAB in the last 72 hours. Anemia Panel: No results for input(s): VITAMINB12, FOLATE, FERRITIN, TIBC, IRON, RETICCTPCT in the last 72 hours. Sepsis Labs: No results for input(s): PROCALCITON, LATICACIDVEN in the last 168 hours.  Recent Results (from the past 240 hours)  Resp panel by RT-PCR (RSV, Flu A&B, Covid) Anterior Nasal Swab     Status: Abnormal   Collection Time: 09/18/23  4:38 PM   Specimen: Anterior Nasal Swab  Result Value Ref Range Status   SARS Coronavirus 2 by RT PCR POSITIVE (A) NEGATIVE Final   Influenza A by PCR NEGATIVE NEGATIVE Final   Influenza B by PCR NEGATIVE NEGATIVE Final    Comment:  (NOTE) The Xpert Xpress SARS-CoV-2/FLU/RSV plus assay is intended as an aid in the diagnosis of influenza from Nasopharyngeal swab specimens and should not be used as a sole basis for treatment. Nasal washings and aspirates are unacceptable for Xpert Xpress SARS-CoV-2/FLU/RSV testing.  Fact Sheet for Patients: BloggerCourse.com  Fact Sheet for Healthcare Providers: SeriousBroker.it  This test is not yet approved or cleared by the [place]  FDA and has been authorized for detection and/or diagnosis of SARS-CoV-2 by FDA under an Emergency Use Authorization (EUA). This EUA will remain in effect (meaning this test can be used) for the duration of the COVID-19 declaration under Section 564(b)(1) of the Act, 21 U.S.C. section 360bbb-3(b)(1), unless the authorization is terminated or revoked.     Resp Syncytial Virus by PCR NEGATIVE NEGATIVE Final    Comment: (NOTE) Fact Sheet for Patients: BloggerCourse.com  Fact Sheet for Healthcare Providers: SeriousBroker.it  This test is not yet approved or cleared by  the [place]  FDA and has been authorized for detection and/or diagnosis of SARS-CoV-2 by FDA under an Emergency Use Authorization (EUA). This EUA will remain in effect (meaning this test can be used) for the duration of the COVID-19 declaration under Section 564(b)(1) of the Act, 21 U.S.C. section 360bbb-3(b)(1), unless the authorization is terminated or revoked.  Performed at [facility], [address], [place], [place] [address]          Radiology Studies: DG Chest Port 1 View Result Date: 09/18/2023 CLINICAL DATA:  Shortness of breath EXAM: PORTABLE CHEST 1 VIEW COMPARISON:  Chest x-ray July 30, 2023 FINDINGS: Accentuated cardiomediastinal silhouette due to low lung volumes. No new consolidations or suspicious pulmonary nodules. Further decreased bilateral  lung base opacities. No pleural effusion. No acute osseous abnormality. IMPRESSION: Further improvement of bilateral lung base opacities. No new nodules. Electronically Signed   By: Megan  Zare M.D.   On: 09/18/2023 17:45        Scheduled Meds:  apixaban   2.5 mg Oral BID   aspirin  EC  81 mg Oral Daily   azithromycin   500 mg Oral Daily   dexamethasone  (DECADRON ) injection  6 mg Intravenous Daily   folic acid   1 mg Oral Daily   simvastatin   20 mg Oral Daily   torsemide   20 mg Oral Daily   Continuous Infusions:   LOS: 1 day    Time spent: 35 minutes    Zaydn Gutridge A Brina Umeda, MD [facility]   If 7PM-7AM, please contact night-coverage [email or web address]  09/19/2023, 7:51 AM

## 2023-09-19 NOTE — Progress Notes (Signed)
   09/19/23 2141  BiPAP/CPAP/SIPAP  Reason BIPAP/CPAP not in use Non-compliant   Patient refused use of CPAP for hospital stay.

## 2023-09-20 DIAGNOSIS — U071 COVID-19: Secondary | ICD-10-CM | POA: Diagnosis not present

## 2023-09-20 LAB — VITAMIN B12: Vitamin B-12: 488 pg/mL (ref 180–914)

## 2023-09-20 MED ORDER — AZITHROMYCIN 500 MG PO TABS: 500.0000 mg | ORAL_TABLET | Freq: Every day | ORAL | 0 refills | Status: AC

## 2023-09-20 MED ORDER — GUAIFENESIN ER 600 MG PO TB12: 600.0000 mg | ORAL_TABLET | Freq: Two times a day (BID) | ORAL | 0 refills | Status: AC

## 2023-09-20 MED ORDER — FOLIC ACID 1 MG PO TABS: 1.0000 mg | ORAL_TABLET | Freq: Every day | ORAL | 0 refills | Status: AC

## 2023-09-20 MED ORDER — DEXAMETHASONE 6 MG PO TABS: 6.0000 mg | ORAL_TABLET | Freq: Every day | ORAL | 0 refills | Status: AC

## 2023-09-20 NOTE — Evaluation (Signed)
 Occupational Therapy Evaluation Patient Details Name: Jason Gates. MRN: [id number] DOB: 11-03-1940 Today's Date: 09/20/2023   History of Present Illness   Pt is an 83 y/o M presenting to ED on 7/25 with SOB/cough, workup +COVID, CXR with basilar infiltrates. PMH includes CAD, bladder CA, paroxysmal A fib, HLD, OSA, GERD, CVA, essential HTN, CAD s/p PCI, CKD III     Clinical Impressions Pt ind at baseline with ADLs/functional mobility, lives with his children who are available PRN. Pt currently performing ADLs without assist, ind with bed mobility and transfers. SpO2 95-96% on RA with ambulation in room, pt denies SOB/dizziness. O2 left off with RN aware. Pt presenting with impairments listed below, however has no further acute OT needs at this time, will s/o. Please reconsult if there is a change in pt status.     If plan is discharge home, recommend the following:   Assistance with cooking/housework     Functional Status Assessment   Patient has had a recent decline in their functional status and demonstrates the ability to make significant improvements in function in a reasonable and predictable amount of time.     Equipment Recommendations   None recommended by OT     Recommendations for Other Services         Precautions/Restrictions   Restrictions Weight Bearing Restrictions Per Provider Order: No     Mobility Bed Mobility Overal bed mobility: Independent                  Transfers Overall transfer level: Independent                        Balance Overall balance assessment: Mild deficits observed, not formally tested                                         ADL either performed or assessed with clinical judgement   ADL Overall ADL's : At baseline                                       General ADL Comments: pt performing ADLs and room distance mobility without assist     Vision   Vision  Assessment?: No apparent visual deficits     Perception Perception: Not tested       Praxis Praxis: Not tested       Pertinent Vitals/Pain Pain Assessment Pain Assessment: No/denies pain     Extremity/Trunk Assessment Upper Extremity Assessment Upper Extremity Assessment: Overall WFL for tasks assessed   Lower Extremity Assessment Lower Extremity Assessment: Defer to PT evaluation   Cervical / Trunk Assessment Cervical / Trunk Assessment: Normal   Communication Communication Communication: No apparent difficulties   Cognition Arousal: Alert Behavior During Therapy: WFL for tasks assessed/performed Cognition: No apparent impairments                               Following commands: Intact       Cueing  General Comments   Cueing Techniques: Verbal cues  VSS on RA   Exercises     Shoulder Instructions      Home Living Family/patient expects to be discharged to:: Private residence Living Arrangements: Children Available Help at Discharge:  Family;Available PRN/intermittently Type of Home: House Home Access: Stairs to enter Entrance Stairs-Number of Steps: 3   Home Layout: Two level;Bed/bath upstairs Alternate Level Stairs-Number of Steps: flight   Bathroom Shower/Tub: Walk-in shower         Home Equipment: Pharmacist, hospital (2 wheels);Cane - single point (oxygen w/ CPAP)          Prior Functioning/Environment Prior Level of Function : Independent/Modified Independent;Driving             Mobility Comments: ind ADLs Comments: ind    OT Problem List: Decreased activity tolerance;Cardiopulmonary status limiting activity   OT Treatment/Interventions:        OT Goals(Current goals can be found in the care plan section)   Acute Rehab OT Goals Patient Stated Goal: to go home OT Goal Formulation: With patient Time For Goal Achievement: 10/04/23 Potential to Achieve Goals: Good   OT Frequency:       Co-evaluation               AM-PAC OT 6 Clicks Daily Activity     Outcome Measure Help from another person eating meals?: None Help from another person taking care of personal grooming?: None Help from another person toileting, which includes using toliet, bedpan, or urinal?: None Help from another person bathing (including washing, rinsing, drying)?: A Little Help from another person to put on and taking off regular upper body clothing?: None Help from another person to put on and taking off regular lower body clothing?: None 6 Click Score: 23   End of Session Nurse Communication: Mobility status  Activity Tolerance: Patient tolerated treatment well Patient left: in bed;with call bell/phone within reach (seated EOB)  OT Visit Diagnosis: Muscle weakness (generalized) (M62.81)                Time: 9248-9194 OT Time Calculation (min): 14 min Charges:  OT General Charges $OT Visit: 1 Visit OT Evaluation $OT Eval Low Complexity: 1 Low  Zalia Hautala K, OTD, OTR/L SecureChat Preferred Acute Rehab [phone]   Wallis Spizzirri K Koonce 09/20/2023, 8:13 AM

## 2023-09-20 NOTE — Discharge Summary (Signed)
 Physician Discharge Summary   Patient: Jason Gates. MRN: [id number] DOB: 1941/01/21  Admit date:     09/18/2023  Discharge date: 09/20/23  Discharge Physician: Owen DELENA Lore   PCP: Franchot Houston, PA-C   Recommendations at discharge:    Needs Bmet to follow renal function.  Needs follow up resolution of Covid.  Resume B 12 supplement.   Discharge Diagnoses: Principal Problem:   COVID-19 Active Problems:   SOB (shortness of breath)   Dizziness   Acute respiratory failure with hypoxia (HCC)   Coronary artery disease involving native coronary artery of native heart without angina pectoris   Paroxysmal atrial fibrillation (HCC)   Hyperlipidemia LDL goal <70   Essential hypertension   Gastroesophageal reflux disease   Obstructive sleep apnea syndrome   (HFpEF) heart failure with preserved ejection fraction (HCC)  Resolved Problems:   * No resolved hospital problems. *  Hospital Course: 83 year old with past medical history significant for CAD, bladder cancer, paroxysmal A-fib, hyperlipidemia, obstructive sleep apnea, GERD, history of CVA, essential hypertension, CAD status post PCI, CKD stage III presented to the ER with shortness of breath and cough.  Also reports dyspnea on exertion and dizziness.  His oxygen saturation at home was 82%.  Workup revealed COVID-19 positive.  Chest x-ray showed bibasilar infiltrates.      Assessment and Plan: 1-Acute on chronic hypoxic respiratory failure in the setting of COVID-19 pneumonia -admitted with hypoxia, dyspnea, cough.  -Covid Positive.  -Treated  with oxygen supplementation and IV steroids Decadron  6 mg IV daily.  -Continue with Azithromycin . Will give 3  days at discharge -discharge on Decadron  for 5 days.   CAD -Continue with aspirin , Zocor , Nitroglycerin  PRN   CKD Stage IIIb: Creatinine baseline 1.6----1.9 Stable . monitor on Torsemide .    Essential hypertension -Continue with Torsemide .    GERD - Continue  with PPI   Obstructive sleep apnea: Resume CPAP at night   Tachy-bradycardia Syndrome.  Paroxysmal A-fib Continue with Eliquis  Not on BB due to bradycardia. Monitor HR.  He follows with Dr. Cindie, antiarrhythmic has been avoided given they may lead to pacemaker therapy being required. Dr. Cindie was recommending conservative management strategy If patient develops sustained an symptomatic A-fib might need cardiology evaluation at that point   Hyperlipidemia: Continue with the statins   Chronic diastolic heart failure: - Continue with torsemide    MCV elevated. Check B 12.         Consultants: None Procedures performed: None Disposition: Home Diet recommendation:  Discharge Diet Orders (From admission, onward)     Start     Ordered   09/20/23 0000  Diet - low sodium heart healthy        09/20/23 1057           Cardiac diet DISCHARGE MEDICATION: Allergies as of 09/20/2023       Reactions   Amoxicillin Hives, Rash   Darunavir Hives   Phosphorus Diarrhea, Nausea And Vomiting   intolerance   Sulfur Dioxide Hives        Medication List     STOP taking these medications    alfuzosin  10 MG 24 hr tablet Commonly known as: UROXATRAL    guaifenesin  400 MG Tabs tablet Commonly known as: HUMIBID E Replaced by: guaiFENesin  600 MG 12 hr tablet   predniSONE  5 MG tablet Commonly known as: DELTASONE        TAKE these medications    amLODipine  5 MG tablet Commonly known as: NORVASC  Take 5 mg by  mouth at bedtime.   aspirin  EC 81 MG tablet Take 81 mg by mouth daily. Swallow whole.   azithromycin  500 MG tablet Commonly known as: ZITHROMAX  Take 1 tablet (500 mg total) by mouth daily for 3 days. What changed:  medication strength how much to take   cycloSPORINE  (PF) 0.09 % Soln Apply 1 drop to eye 2 (two) times daily.   dexamethasone  6 MG tablet Commonly known as: DECADRON  Take 1 tablet (6 mg total) by mouth daily for 5 days.   Dupilumab 300 MG/2ML  Soaj Inject 300 mg into the skin daily at 2 PM.   Eliquis  2.5 MG Tabs tablet Generic drug: apixaban  TAKE 1 TABLET TWICE A DAY   folic acid  1 MG tablet Commonly known as: FOLVITE  Take 1 tablet (1 mg total) by mouth daily. Start taking on: September 21, 2023   guaiFENesin  600 MG 12 hr tablet Commonly known as: MUCINEX  Take 1 tablet (600 mg total) by mouth 2 (two) times daily for 10 days. Replaces: guaifenesin  400 MG Tabs tablet   nitroGLYCERIN  0.4 MG SL tablet Commonly known as: Nitrostat  Place 1 tablet (0.4 mg total) under the tongue every 5 (five) minutes as needed for chest pain.   pantoprazole  40 MG tablet Commonly known as: PROTONIX  Take 1 tablet (40 mg total) by mouth at bedtime. What changed: when to take this   simvastatin  20 MG tablet Commonly known as: ZOCOR  Take 20 mg by mouth daily.   torsemide  20 MG tablet Commonly known as: DEMADEX  Take 1 tablet (20 mg total) by mouth daily.   valACYclovir  500 MG tablet Commonly known as: VALTREX  Take 500 mg by mouth daily.   Vitamin D  125 MCG (5000 UT) Caps Take 5,000 Units by mouth daily.        Discharge Exam: Filed Weights   09/18/23 1630  Weight: 105.2 kg   General; NAD  Condition at discharge: stable  The results of significant diagnostics from this hospitalization (including imaging, microbiology, ancillary and laboratory) are listed below for reference.   Imaging Studies: DG Chest Port 1 View Result Date: 09/18/2023 CLINICAL DATA:  Shortness of breath EXAM: PORTABLE CHEST 1 VIEW COMPARISON:  Chest x-ray July 30, 2023 FINDINGS: Accentuated cardiomediastinal silhouette due to low lung volumes. No new consolidations or suspicious pulmonary nodules. Further decreased bilateral lung base opacities. No pleural effusion. No acute osseous abnormality. IMPRESSION: Further improvement of bilateral lung base opacities. No new nodules. Electronically Signed   By: Megan  Zare M.D.   On: 09/18/2023 17:45   ECHOCARDIOGRAM  COMPLETE Result Date: 09/01/2023    ECHOCARDIOGRAM REPORT   Patient Name:   Jason Gates. Date of Exam: 09/01/2023 Medical Rec #:  [id number]           Height:       68.0 in Accession #:    [id number]          Weight:       236.2 lb Date of Birth:  06/28/1940           BSA:          2.193 m Patient Age:    83 years            BP:           150/78 mmHg Patient Gender: M                   HR:           79 bpm. Exam  Location:   Procedure: 2D Echo, Cardiac Doppler and Color Doppler (Both Spectral and Color            Flow Doppler were utilized during procedure). Indications:    I50.23 Acute on chronic systolic (congestive) heart failure  History:        Patient has prior history of Echocardiogram examinations, most                 recent 10/18/2023. CAD and Previous Myocardial Infarction,                 Arrythmias:Atrial Fibrillation, Signs/Symptoms:Chest Pain and                 Shortness of Breath; Risk Factors:Hypertension and Dyslipidemia.  Sonographer:    Alm Minerva Referring Phys: [phone] CHRISTOPHER END IMPRESSIONS  1. Left ventricular ejection fraction, by estimation, is 60 to 65%. The left ventricle has normal function. The left ventricle has no regional wall motion abnormalities. Left ventricular diastolic parameters are consistent with Grade I diastolic dysfunction (impaired relaxation).  2. Right ventricular systolic function is normal. The right ventricular size is normal.  3. Left atrial size was mild to moderately dilated.  4. The mitral valve is normal in structure. Mild mitral valve regurgitation. No evidence of mitral stenosis.  5. The aortic valve is normal in structure. Aortic valve regurgitation is mild. No aortic stenosis is present.  6. The inferior vena cava is normal in size with greater than 50% respiratory variability, suggesting right atrial pressure of 3 mmHg. FINDINGS  Left Ventricle: Left ventricular ejection fraction, by estimation, is 60 to 65%. The left ventricle has  normal function. The left ventricle has no regional wall motion abnormalities. Strain was performed and the global longitudinal strain is indeterminate. The left ventricular internal cavity size was normal in size. There is no left ventricular hypertrophy. Left ventricular diastolic parameters are consistent with Grade I diastolic dysfunction (impaired relaxation). Right Ventricle: The right ventricular size is normal. No increase in right ventricular wall thickness. Right ventricular systolic function is normal. Left Atrium: Left atrial size was mild to moderately dilated. Right Atrium: Right atrial size was normal in size. Pericardium: There is no evidence of pericardial effusion. Mitral Valve: The mitral valve is normal in structure. Mild mitral valve regurgitation. No evidence of mitral valve stenosis. Tricuspid Valve: The tricuspid valve is normal in structure. Tricuspid valve regurgitation is not demonstrated. No evidence of tricuspid stenosis. Aortic Valve: The aortic valve is normal in structure. Aortic valve regurgitation is mild. No aortic stenosis is present. Aortic valve mean gradient measures 3.0 mmHg. Aortic valve peak gradient measures 5.8 mmHg. Aortic valve area, by VTI measures 2.51 cm. Pulmonic Valve: The pulmonic valve was normal in structure. Pulmonic valve regurgitation is mild. No evidence of pulmonic stenosis. Aorta: The aortic root is normal in size and structure. Venous: The inferior vena cava is normal in size with greater than 50% respiratory variability, suggesting right atrial pressure of 3 mmHg. IAS/Shunts: No atrial level shunt detected by color flow Doppler. Additional Comments: 3D was performed not requiring image post processing on an independent workstation and was indeterminate.  LEFT VENTRICLE PLAX 2D LVIDd:         5.10 cm      Diastology LVIDs:         3.90 cm      LV e' medial:    5.78 cm/s LV PW:         1.10 cm  LV E/e' medial:  19.4 LV IVS:        1.20 cm      LV e'  lateral:   11.20 cm/s LVOT diam:     2.20 cm      LV E/e' lateral: 10.0 LV SV:         70 LV SV Index:   32 LVOT Area:     3.80 cm  LV Volumes (MOD) LV vol d, MOD A2C: 70.9 ml LV vol d, MOD A4C: 106.0 ml LV vol s, MOD A2C: 35.3 ml LV vol s, MOD A4C: 48.7 ml LV SV MOD A2C:     35.6 ml LV SV MOD A4C:     106.0 ml LV SV MOD BP:      46.7 ml RIGHT VENTRICLE RV Basal diam:  4.20 cm RV Mid diam:    3.00 cm RV S prime:     23.70 cm/s TAPSE (M-mode): 2.4 cm LEFT ATRIUM             Index        RIGHT ATRIUM           Index LA Vol (A2C):   83.8 ml 38.20 ml/m  RA Area:     15.50 cm LA Vol (A4C):   65.8 ml 30.00 ml/m  RA Volume:   39.00 ml  17.78 ml/m LA Biplane Vol: 75.1 ml 34.24 ml/m  AORTIC VALVE AV Area (Vmax):    2.37 cm AV Area (Vmean):   2.25 cm AV Area (VTI):     2.51 cm AV Vmax:           120.00 cm/s AV Vmean:          77.800 cm/s AV VTI:            0.279 m AV Peak Grad:      5.8 mmHg AV Mean Grad:      3.0 mmHg LVOT Vmax:         74.90 cm/s LVOT Vmean:        46.100 cm/s LVOT VTI:          0.184 m LVOT/AV VTI ratio: 0.66  AORTA Ao Sinus diam: 3.40 cm Ao Asc diam:   3.30 cm MV E velocity: 112.00 cm/s                             SHUNTS                             Systemic VTI:  0.18 m                             Systemic Diam: 2.20 cm Evalene Lunger MD Electronically signed by Evalene Lunger MD Signature Date/Time: 09/01/2023/7:13:02 PM    Final     Microbiology: Results for orders placed or performed during the hospital encounter of 09/18/23  Resp panel by RT-PCR (RSV, Flu A&B, Covid) Anterior Nasal Swab     Status: Abnormal   Collection Time: 09/18/23  4:38 PM   Specimen: Anterior Nasal Swab  Result Value Ref Range Status   SARS Coronavirus 2 by RT PCR POSITIVE (A) NEGATIVE Final   Influenza A by PCR NEGATIVE NEGATIVE Final   Influenza B by PCR NEGATIVE NEGATIVE Final    Comment: (NOTE) The Xpert Xpress SARS-CoV-2/FLU/RSV plus assay is intended as  an aid in the diagnosis of influenza from  Nasopharyngeal swab specimens and should not be used as a sole basis for treatment. Nasal washings and aspirates are unacceptable for Xpert Xpress SARS-CoV-2/FLU/RSV testing.  Fact Sheet for Patients: BloggerCourse.com  Fact Sheet for Healthcare Providers: SeriousBroker.it  This test is not yet approved or cleared by the [place]  FDA and has been authorized for detection and/or diagnosis of SARS-CoV-2 by FDA under an Emergency Use Authorization (EUA). This EUA will remain in effect (meaning this test can be used) for the duration of the COVID-19 declaration under Section 564(b)(1) of the Act, 21 U.S.C. section 360bbb-3(b)(1), unless the authorization is terminated or revoked.     Resp Syncytial Virus by PCR NEGATIVE NEGATIVE Final    Comment: (NOTE) Fact Sheet for Patients: BloggerCourse.com  Fact Sheet for Healthcare Providers: SeriousBroker.it  This test is not yet approved or cleared by the [place]  FDA and has been authorized for detection and/or diagnosis of SARS-CoV-2 by FDA under an Emergency Use Authorization (EUA). This EUA will remain in effect (meaning this test can be used) for the duration of the COVID-19 declaration under Section 564(b)(1) of the Act, 21 U.S.C. section 360bbb-3(b)(1), unless the authorization is terminated or revoked.  Performed at [facility], [address] [place], [place] [address]     Labs: CBC: Recent Labs  Lab 09/18/23 1633 09/18/23 1735 09/18/23 2156 09/19/23 0524  WBC 8.8  --  9.5 5.0  HGB 14.3 15.0 15.3 14.6  HCT 43.8 44.0 46.2 43.4  MCV 106.6*  --  106.5* 104.6*  PLT 191  --  184 178   Basic Metabolic Panel: Recent Labs  Lab 09/18/23 1633 09/18/23 1735 09/18/23 2156 09/19/23 0524  NA 142 144  --  142  K 3.5 3.5  --  3.9  CL 103  --   --  105  CO2 29  --   --  28  GLUCOSE 107*  --   --  167*  BUN  20  --   --  20  CREATININE 2.01*  --  2.01* 1.80*  CALCIUM 9.1  --   --  8.9   Liver Function Tests: Recent Labs  Lab 09/19/23 0524  AST 32  ALT 23  ALKPHOS 44  BILITOT 0.8  PROT 6.2*  ALBUMIN 3.3*   CBG: No results for input(s): GLUCAP in the last 168 hours.  Discharge time spent: greater than 30 minutes.  Signed: Owen DELENA Lore, MD [facility] 09/20/2023

## 2023-09-20 NOTE — Plan of Care (Signed)
  Problem: Pain Managment: Goal: General experience of comfort will improve and/or be controlled Outcome: Progressing   Problem: Safety: Goal: Ability to remain free from injury will improve Outcome: Progressing

## 2023-09-21 ENCOUNTER — Other Ambulatory Visit: Payer: Self-pay | Admitting: Internal Medicine

## 2023-09-22 NOTE — Progress Notes (Unsigned)
 Cardiology Office Note    Date:  09/23/2023   ID:  Jason Gates Nilda Mickey., DOB 1940/12/25, MRN [id number]  PCP:  Franchot Houston, PA-C  Cardiologist:  Lonni Hanson, MD  Electrophysiologist:  OLE ONEIDA HOLTS, MD   Chief Complaint: Follow-up  History of Present Illness:   Jason Gates. is a 83 y.o. male with history of CAD status post remote PCI's to the LAD and diagonal branch complicated by possible subacute stent thrombosis, HFpEF, PAF on apixaban , PSVT, frequent PACs/PVCs, CVA, CKD stage IIIb, HTN, HLD bladder cancer, OSA on CPAP, and GERD who presents for follow-up of HFpEF.  He previously underwent PCI and stenting to the LAD and D1 in 2006 in [place] .  After initial PCI, he required repeat intervention 3 days later with details unclear.  CT 1 was subsequently noted to be occluded on cath in 07/2009.  LHC in 01/2013 showed stable anatomy with patent LAD stent, severe proximal diagonal disease and otherwise nonobstructive disease.  He was diagnosed with A-fib in 2022 and anticoagulated with apixaban .  Stress testing in 10/2021 was low risk and without ischemia.  ZIO monitoring in 10/2022 showed a 6% A-fib burden as well as a 22.4% PAC and 5.1% PVC burden.  He was evaluated by EP who recommended ongoing conservative management in the setting of propensity for bradycardia that may be worsened with amiodarone therapy.  He was admitted to the hospital in 07/2023 with sudden onset of substernal chest pain radiating to the neck and jaw.  He was initially in A-fib with heart rate of 84 bpm, though subsequently converted to sinus rhythm.  Troponin was flat and minimally elevated.  Creatinine was above baseline at 2.6.  He underwent Lexiscan  MPI that was low risk and probably normal.  It was felt that his PAF may have precipitated his discomfort.  Cardiac cath was deferred.  Amiodarone was recommended, though he preferred to discuss this with the EP first.  He was seen by general cardiology on  08/19/2023 and was without symptoms of angina or cardiac decompensation.  He reported he was frustrated by being tired all the time.  He also reported an episode earlier in the month in which he felt like his lower abdomen, pelvis, and legs were being weighted down.  He reported an estimated weight gain of approximately 15 pounds since starting prednisone  2 months prior.  He was without orthopnea.  He was noted to be back in atrial fibrillation at that time with recommendation to follow-up with the EP as patient and son reported reluctance to pursue rhythm control strategy without EP input.  His weight was up 10 pounds by our scale.  He was noted to torsemide  had previously been held due to AKI.  It was recommended spironolactone be discontinued given progressive renal decline and he start furosemide  40 mg daily.  He was seen by Dr. HOLTS on 09/16/2023 with continued recommendation for conservative management strategy with Zio patch recommended and pending at this time to quantify A-fib burden.  EKG not performed at that time.  He was admitted to [facility] from 09/18/2023 through 09/13/2023 with acute on chronic hypoxic respiratory failure secondary to COVID-19 pneumonia treated with oxygen supplementation, IV steroids, and azithromycin .  EKG showed he was maintaining sinus rhythm with frequent PACs and PVCs.  He comes in today and is without symptoms of angina or cardiac decompensation.  He does continue to note some ongoing fatigue in the setting of his COVID-19 infection.  No tachypalpitations, dizziness,  presyncope, or syncope.  No significant lower extremity swelling or progressive orthopnea.  No falls or symptoms concerning for bleeding.  His weight is down 8 pounds today when compared to his visit in our office last week.  Labs obtained 3 days prior show stable to improved serum creatinine.  He has not yet started the Zio patch and indicates he plans to start this in early September when he is back from his  trip to [place] .   Labs independently reviewed: 08/2023 - potassium 3.9, BUN 20, serum creatinine 1.8, albumin 3.3, AST/ALT normal, Hgb 14.6, PLT 178, BNP 244, TC 144, TG 146, HDL 46, LDL 68, A1c 6.4 03/2023 - magnesium 2.2  Past Medical History:  Diagnosis Date   Bladder cancer (HCC)    CAD (coronary artery disease)    a. 2006 s/p PCI of LAD and D1; b. 07/2009 Cath: Patent LAD stent, occluded D1->Med rx; c. 01/2013 Cath: LM nl, LAD 20p, patent stent, D1 70-80p ISR, D2 nl, LCX 10, RCA nl, EF 60-65%; d. 10/2021 MV: fixed apical/apical lateral defect->artifact though cannot r/o scar. No ischemia. EF >65%.   CKD (chronic kidney disease), stage III (HCC)    Diastolic dysfunction    a. 09/2022 Echo: EF 55-60%, no rwma, GrI DD, nl RV fxn, no significant valvular dzs.   GERD (gastroesophageal reflux disease)    Hyperlipidemia LDL goal <70    OSA (obstructive sleep apnea)    PAF (paroxysmal atrial fibrillation) (HCC)    a. 10/2022 Zio: predominantly sinus rhythm, avg 66 (39-122). 6% afib burden, longest 1h 72m @ 102. Freq PACs (22.4%), freq PVCs (5.1%). 12 runs NSVT up to 12 beats @ 167. No prolonged pauses. Triggered events = RSR, PACs, PVCs; b. CHA2DS2VASc = 6-->eliquis  2.5 bid (age/creat).   Primary hypertension    PSVT (paroxysmal supraventricular tachycardia) (HCC)    Right upper lobe pulmonary nodule    Stroke [facility])     Past Surgical History:  Procedure Laterality Date   CARDIAC SURGERY     CHOLECYSTECTOMY     COLONOSCOPY WITH PROPOFOL  N/A 04/21/2022   Procedure: COLONOSCOPY WITH PROPOFOL ;  Surgeon: Maryruth Ole DASEN, MD;  Location: [facility] ENDOSCOPY;  Service: Endoscopy;  Laterality: N/A;   CORONARY ANGIOPLASTY     two stents in 2007   ESOPHAGOGASTRODUODENOSCOPY (EGD) WITH PROPOFOL  N/A 04/21/2022   Procedure: ESOPHAGOGASTRODUODENOSCOPY (EGD) WITH PROPOFOL ;  Surgeon: Maryruth Ole DASEN, MD;  Location: [facility] ENDOSCOPY;  Service: Endoscopy;  Laterality: N/A;   EYE SURGERY     NASAL  SEPTUM SURGERY      Current Medications: Current Meds  Medication Sig   amLODipine  (NORVASC ) 5 MG tablet Take 5 mg by mouth at bedtime.   aspirin  EC 81 MG tablet Take 81 mg by mouth daily. Swallow whole.   azithromycin  (ZITHROMAX ) 500 MG tablet Take 1 tablet (500 mg total) by mouth daily for 3 days.   Cholecalciferol  (VITAMIN D ) 125 MCG (5000 UT) CAPS Take 5,000 Units by mouth daily.   cycloSPORINE , PF, 0.09 % SOLN Apply 1 drop to eye 2 (two) times daily.   dexamethasone  (DECADRON ) 6 MG tablet Take 1 tablet (6 mg total) by mouth daily for 5 days.   Dupilumab 300 MG/2ML SOAJ Inject 300 mg into the skin daily at 2 PM.   ELIQUIS  2.5 MG TABS tablet TAKE 1 TABLET TWICE A DAY   folic acid  (FOLVITE ) 1 MG tablet Take 1 tablet (1 mg total) by mouth daily.   guaiFENesin  (MUCINEX ) 600 MG 12 hr  tablet Take 1 tablet (600 mg total) by mouth 2 (two) times daily for 10 days.   nitroGLYCERIN  (NITROSTAT ) 0.4 MG SL tablet Place 1 tablet (0.4 mg total) under the tongue every 5 (five) minutes as needed for chest pain.   pantoprazole  (PROTONIX ) 40 MG tablet Take 1 tablet (40 mg total) by mouth at bedtime. (Patient taking differently: Take 40 mg by mouth daily.)   simvastatin  (ZOCOR ) 20 MG tablet Take 20 mg by mouth daily.   torsemide  (DEMADEX ) 20 MG tablet TAKE 1 TABLET(20 MG) BY MOUTH DAILY   valACYclovir  (VALTREX ) 500 MG tablet Take 500 mg by mouth daily.    Allergies:   Amoxicillin, Darunavir, Phosphorus, and Sulfur dioxide   Social History   Socioeconomic History   Marital status: Widowed    Spouse name: Not on file   Number of children: Not on file   Years of education: Not on file   Highest education level: Not on file  Occupational History   Not on file  Tobacco Use   Smoking status: Former    Current packs/day: 0.00    Average packs/day: 1 pack/day for 15.0 years (15.0 ttl pk-yrs)    Types: Cigarettes    Start date: 57    Quit date: 64    Years since quitting: 49.6   Smokeless  tobacco: Not on file   Tobacco comments:    Quit smoking in 1976  Vaping Use   Vaping status: Never Used  Substance and Sexual Activity   Alcohol  use: Not Currently    Comment: quit in 1976   Drug use: Never   Sexual activity: Not on file  Other Topics Concern   Not on file  Social History Narrative   Not on file   Social Drivers of Health   Financial Resource Strain: Low Risk  (09/09/2023)   Received from [facility]   Overall Financial Resource Strain (CARDIA)    Difficulty of Paying Living Expenses: Not hard at all  Food Insecurity: No Food Insecurity (09/18/2023)   Hunger Vital Sign    Worried About Running Out of Food in the Last Year: Never true    Ran Out of Food in the Last Year: Never true  Transportation Needs: No Transportation Needs (09/19/2023)   PRAPARE - Administrator, Civil Service (Medical): No    Lack of Transportation (Non-Medical): No  Physical Activity: Not on file  Stress: Not on file  Social Connections: Moderately Integrated (09/18/2023)   Social Connection and Isolation Panel    Frequency of Communication with Friends and Family: More than three times a week    Frequency of Social Gatherings with Friends and Family: More than three times a week    Attends Religious Services: More than 4 times per year    Active Member of [employer] or Organizations: Yes    Attends Banker Meetings: More than 4 times per year    Marital Status: Widowed     Family History:  The patient's family history includes Diabetes in his father; Heart attack in his father; Heart disease in his father; Obesity in his mother.  ROS:   12-point review of systems is negative unless otherwise noted in the HPI.   EKGs/Labs/Other Studies Reviewed:    Studies reviewed were summarized above. The additional studies were reviewed today:  2D echo 09/01/2023: 1. Left ventricular ejection fraction, by estimation, is 60 to 65%. The  left ventricle has  normal function. The left ventricle  has no regional  wall motion abnormalities. Left ventricular diastolic parameters are  consistent with Grade I diastolic  dysfunction (impaired relaxation).   2. Right ventricular systolic function is normal. The right ventricular  size is normal.   3. Left atrial size was mild to moderately dilated.   4. The mitral valve is normal in structure. Mild mitral valve  regurgitation. No evidence of mitral stenosis.   5. The aortic valve is normal in structure. Aortic valve regurgitation is  mild. No aortic stenosis is present.   6. The inferior vena cava is normal in size with greater than 50%  respiratory variability, suggesting right atrial pressure of 3 mmHg.  __________  Lexiscan  MPI 07/31/2023:   Low risk, probably normal pharmacologic myocardial perfusion stress test.   There is a moderate in size, moderate in severity, fixed apical anterior, apical lateral, and apical defect most consistent with attenuation artifact but cannot rule out scar.   There is a small in size, moderate in severity, fixed basal inferior defect consistent with diaphragmatic attenuation.   No significant ischemia is identified.   Left ventricular systolic function is normal with normal wall motion (LVEF > 65%).   Attenuation correction could not be performed, which limits sensitivity and specificity of the study.   Allowing for differences in technique, there has been no significant change from the prior SPECT/CT study on 10/29/2021. __________  Zio patch 10/2022:   The patient was monitored for 13 days.   The predominant rhythm was sinus with an average rate of 66 bpm in sinus rhythm (range 39-122 bpm).   Paroxysmal atrial fibrillation was observed.  Atrial fibrillation burden was 6%, with longest episode lasting 1 hour, 49 minutes.  Average ventricular rate was 102 bpm (range 55-188 bpm).   There were frequent PAC's (22.4%) and frequent PVC's (5.1%).   12 episodes of nonsustained  ventricular tachycardia occurred, lasting up to 12 beats with a maximum rate of 167 bpm.   No prolonged pause was observed.   Patient triggered events correspond to sinus rhythm, PAC's and PVC's.   Sinus rhythm with paroxysmal atrial fibrillation and frequent PAC's and PVCs.  Multiple runs of nonsustained ventricular tachycardia also noted, as detailed above. __________  2D echo 09/25/2022: 1. Left ventricular ejection fraction, by estimation, is 55 to 60%. The  left ventricle has normal function. The left ventricle has no regional  wall motion abnormalities. Left ventricular diastolic parameters are  consistent with Grade I diastolic  dysfunction (impaired relaxation).   2. Right ventricular systolic function is normal. The right ventricular  size is normal. Tricuspid regurgitation signal is inadequate for assessing  PA pressure.   3. The mitral valve is normal in structure. No evidence of mitral valve  regurgitation.   4. The aortic valve is grossly normal. Aortic valve regurgitation is not  visualized.   5. The inferior vena cava is normal in size with greater than 50%  respiratory variability, suggesting right atrial pressure of 3 mmHg.  __________  Lexiscan  MPI 10/29/2021:   Low risk, probably normal pharmacologic myocardial perfusion stress test.   There is a small in size, moderate in severity, fixed apical and apical lateral defect most consistent with artifact but cannot rule out scar.   No significant ischemia is identified.   Left ventricular systolic function is normal by visual estimation and Siemens calculation (LVEF greater than 65%).   Coronary artery calcification and/or stent are noted, as well as aortic atherosclerosis. __________  See CV Studies in  Epic for more remote cardiac imaging    EKG:  EKG is not ordered today.    Recent Labs: 04/14/2023: Magnesium 2.2 09/18/2023: B Natriuretic Peptide 244.4 09/19/2023: ALT 23; BUN 20; Creatinine, Ser 1.80; Hemoglobin  14.6; Platelets 178; Potassium 3.9; Sodium 142  Recent Lipid Panel    Component Value Date/Time   CHOL 137 01/31/2021 1444   TRIG 204 (H) 01/31/2021 1444   HDL 35 (L) 01/31/2021 1444   CHOLHDL 3.9 01/31/2021 1444   LDLCALC 68 01/31/2021 1444    PHYSICAL EXAM:    VS:  BP 133/89   Pulse 85   Ht 5' 8 (1.727 m)   Wt 228 lb (103.4 kg)   SpO2 96%   BMI 34.67 kg/m   BMI: Body mass index is 34.67 kg/m.  Physical Exam Vitals reviewed.  Constitutional:      Appearance: He is well-developed.  HENT:     Head: Normocephalic and atraumatic.  Eyes:     General:        Right eye: No discharge.        Left eye: No discharge.  Neck:     Vascular: No JVD.  Cardiovascular:     Rate and Rhythm: Normal rate and regular rhythm. Occasional Extrasystoles are present.    Heart sounds: Normal heart sounds, S1 normal and S2 normal. Heart sounds not distant. No midsystolic click and no opening snap. No murmur heard.    No friction rub.  Pulmonary:     Effort: Pulmonary effort is normal. No respiratory distress.     Breath sounds: Normal breath sounds. No decreased breath sounds, wheezing, rhonchi or rales.  Abdominal:     General: There is no distension.     Palpations: Abdomen is soft.     Tenderness: There is no abdominal tenderness.  Musculoskeletal:     Cervical back: Normal range of motion.     Right lower leg: No edema.     Left lower leg: No edema.  Skin:    General: Skin is warm and dry.     Nails: There is no clubbing.  Neurological:     Mental Status: He is alert and oriented to person, place, and time.  Psychiatric:        Speech: Speech normal.        Behavior: Behavior normal.        Thought Content: Thought content normal.        Judgment: Judgment normal.     Wt Readings from Last 3 Encounters:  09/23/23 228 lb (103.4 kg)  09/18/23 232 lb (105.2 kg)  09/16/23 236 lb (107 kg)     ASSESSMENT & PLAN:   CAD involving native coronary arteries without angina: He is  doing well and without symptoms concerning for angina or cardiac decompensation.  Recent Lexiscan  MPI was low risk and without evidence of ischemia.  Continue aggressive risk factor modification and secondary prevention including aspirin  81 mg (could look to discontinue this moving forward given he is on apixaban ) as well as simvastatin  20 mg, and amlodipine  for antianginal effect.  Given reassuring stress test, lack of anginal symptoms, and in the context of advanced CKD defer further cardiac testing at this time.  HFpEF: Euvolemic and well compensated currently on torsemide  20 mg daily.  Recent labs obtained 3 days prior shows stable to improved renal function.  He indicates he plans to follow-up with his nephrologist as well.  Not currently on MRA with progressive renal dysfunction.  PAF:  It remains uncertain the role of underlying A-fib is playing in his intermittent volume overload and prior symptoms of chest pain.  He is followed by EP with recommendation to complete Zio patch monitoring to quantify A-fib burden with further recommendations thereafter.  Not currently on AV nodal blocking medication with underlying bradycardic rates.  CHA2DS2-VASc at least 7 (CHF, HTN, age x 2, CVA x 2, vascular disease).  He remains on apixaban  2.5 mg twice daily and meets reduced dose to criteria with age and serum creatinine.  Ongoing management per EP.  HTN: Blood pressure is reasonably controlled in the office today.  He remains on amlodipine  5 mg and torsemide  as outlined above.  Low-sodium diet is encouraged.  HLD: LDL 68 in 08/2023.  He remains on simvastatin  20 mg.  CKD stage IIIb: Stable to improved on labs obtained 3 days prior.  Followed by nephrology.     Disposition: F/u with Dr. Mady in 2 months, and EP as directed.    Medication Adjustments/Labs and Tests Ordered: Current medicines are reviewed at length with the patient today.  Concerns regarding medicines are outlined above. Medication changes,  Labs and Tests ordered today are summarized above and listed in the Patient Instructions accessible in Encounters.   Signed, Bernardino Bring, PA-C 09/23/2023 4:53 PM     [facility] [place] [address] [place], [place] [address] [phone]

## 2023-09-23 ENCOUNTER — Encounter: Payer: Self-pay | Admitting: Physician Assistant

## 2023-09-23 ENCOUNTER — Ambulatory Visit: Attending: Physician Assistant | Admitting: Physician Assistant

## 2023-09-23 VITALS — BP 133/89 | HR 85 | Ht 68.0 in | Wt 228.0 lb

## 2023-09-23 DIAGNOSIS — I480 Paroxysmal atrial fibrillation: Secondary | ICD-10-CM | POA: Insufficient documentation

## 2023-09-23 DIAGNOSIS — I10 Essential (primary) hypertension: Secondary | ICD-10-CM | POA: Diagnosis present

## 2023-09-23 DIAGNOSIS — N1832 Chronic kidney disease, stage 3b: Secondary | ICD-10-CM | POA: Insufficient documentation

## 2023-09-23 DIAGNOSIS — I2510 Atherosclerotic heart disease of native coronary artery without angina pectoris: Secondary | ICD-10-CM | POA: Diagnosis present

## 2023-09-23 DIAGNOSIS — E785 Hyperlipidemia, unspecified: Secondary | ICD-10-CM | POA: Diagnosis present

## 2023-09-23 DIAGNOSIS — I5032 Chronic diastolic (congestive) heart failure: Secondary | ICD-10-CM | POA: Diagnosis present

## 2023-09-23 NOTE — Patient Instructions (Signed)
 Medication Instructions:  Your physician recommends that you continue on your current medications as directed. Please refer to the Current Medication list given to you today.   *If you need a refill on your cardiac medications before your next appointment, please call your pharmacy*  Lab Work: None ordered at this time  If you have labs (blood work) drawn today and your tests are completely normal, you will receive your results only by: MyChart Message (if you have MyChart) OR A paper copy in the mail If you have any lab test that is abnormal or we need to change your treatment, we will call you to review the results.  Testing/Procedures: None ordered at this time   Follow-Up: At [facility], you and your health needs are our priority.  As part of our continuing mission to provide you with exceptional heart care, our providers are all part of one team.  This team includes your primary Cardiologist (physician) and Advanced Practice Providers or APPs (Physician Assistants and Nurse Practitioners) who all work together to provide you with the care you need, when you need it.  Your next appointment:   2 month(s)  Provider:   Lonni Hanson, MD

## 2023-09-30 ENCOUNTER — Ambulatory Visit: Admitting: Cardiology

## 2023-11-25 ENCOUNTER — Ambulatory Visit: Admitting: Internal Medicine

## 2023-12-17 ENCOUNTER — Ambulatory Visit: Admitting: Cardiology

## 2024-06-09 ENCOUNTER — Other Ambulatory Visit: Admitting: Urology
